# Patient Record
Sex: Female | Born: 1960
Health system: Southern US, Community
[De-identification: ages and names within clinical notes are randomized; demographics above are authoritative.]

## PROBLEM LIST (undated history)

## (undated) DIAGNOSIS — R112 Nausea with vomiting, unspecified: Secondary | ICD-10-CM

## (undated) DIAGNOSIS — G8929 Other chronic pain: Secondary | ICD-10-CM

## (undated) DIAGNOSIS — M545 Low back pain, unspecified: Secondary | ICD-10-CM

## (undated) DIAGNOSIS — E039 Hypothyroidism, unspecified: Secondary | ICD-10-CM

## (undated) DIAGNOSIS — M419 Scoliosis, unspecified: Secondary | ICD-10-CM

## (undated) DIAGNOSIS — Z9889 Other specified postprocedural states: Secondary | ICD-10-CM

## (undated) DIAGNOSIS — E7800 Pure hypercholesterolemia, unspecified: Secondary | ICD-10-CM

## (undated) DIAGNOSIS — Z9289 Personal history of other medical treatment: Secondary | ICD-10-CM

## (undated) DIAGNOSIS — G709 Myoneural disorder, unspecified: Secondary | ICD-10-CM

## (undated) DIAGNOSIS — I499 Cardiac arrhythmia, unspecified: Secondary | ICD-10-CM

## (undated) DIAGNOSIS — G43909 Migraine, unspecified, not intractable, without status migrainosus: Secondary | ICD-10-CM

## (undated) DIAGNOSIS — J45909 Unspecified asthma, uncomplicated: Secondary | ICD-10-CM

## (undated) DIAGNOSIS — N182 Chronic kidney disease, stage 2 (mild): Secondary | ICD-10-CM

## (undated) DIAGNOSIS — F419 Anxiety disorder, unspecified: Secondary | ICD-10-CM

## (undated) HISTORY — PX: BACK SURGERY: SHX140

## (undated) HISTORY — PX: HERNIA REPAIR: SHX51

## (undated) HISTORY — PX: KNEE ARTHROSCOPY: SHX127

## (undated) HISTORY — PX: JOINT REPLACEMENT: SHX530

---

## 1986-09-07 HISTORY — PX: ABDOMINAL HYSTERECTOMY: SHX81

## 1986-09-07 HISTORY — PX: BLADDER AUGMENTATION: SHX1233

## 2001-04-07 ENCOUNTER — Emergency Department (HOSPITAL_COMMUNITY): Admission: EM | Admit: 2001-04-07 | Discharge: 2001-04-07 | Payer: Self-pay | Admitting: Emergency Medicine

## 2003-06-10 ENCOUNTER — Encounter: Payer: Self-pay | Admitting: Family Medicine

## 2003-06-10 ENCOUNTER — Encounter: Admission: RE | Admit: 2003-06-10 | Discharge: 2003-06-10 | Payer: Self-pay | Admitting: Family Medicine

## 2003-12-27 ENCOUNTER — Other Ambulatory Visit: Admission: RE | Admit: 2003-12-27 | Discharge: 2003-12-27 | Payer: Self-pay | Admitting: Gynecology

## 2004-02-07 ENCOUNTER — Ambulatory Visit (HOSPITAL_COMMUNITY): Admission: RE | Admit: 2004-02-07 | Discharge: 2004-02-07 | Payer: Self-pay | Admitting: Gynecology

## 2004-03-27 ENCOUNTER — Ambulatory Visit (HOSPITAL_COMMUNITY): Admission: RE | Admit: 2004-03-27 | Discharge: 2004-03-27 | Payer: Self-pay | Admitting: Gynecology

## 2004-11-02 ENCOUNTER — Encounter: Admission: RE | Admit: 2004-11-02 | Discharge: 2004-11-02 | Payer: Self-pay | Admitting: Orthopedic Surgery

## 2005-08-05 ENCOUNTER — Encounter: Admission: RE | Admit: 2005-08-05 | Discharge: 2005-08-05 | Payer: Self-pay | Admitting: Orthopedic Surgery

## 2006-02-12 ENCOUNTER — Emergency Department (HOSPITAL_COMMUNITY): Admission: EM | Admit: 2006-02-12 | Discharge: 2006-02-12 | Payer: Self-pay | Admitting: Emergency Medicine

## 2006-09-07 HISTORY — PX: SPINAL FUSION: SHX223

## 2006-12-10 ENCOUNTER — Emergency Department (HOSPITAL_COMMUNITY): Admission: EM | Admit: 2006-12-10 | Discharge: 2006-12-10 | Payer: Self-pay | Admitting: Emergency Medicine

## 2007-02-08 ENCOUNTER — Encounter: Admission: RE | Admit: 2007-02-08 | Discharge: 2007-02-08 | Payer: Self-pay | Admitting: Orthopedic Surgery

## 2007-02-22 ENCOUNTER — Encounter: Admission: RE | Admit: 2007-02-22 | Discharge: 2007-02-22 | Payer: Self-pay | Admitting: Orthopedic Surgery

## 2007-06-20 ENCOUNTER — Emergency Department (HOSPITAL_COMMUNITY): Admission: EM | Admit: 2007-06-20 | Discharge: 2007-06-20 | Payer: Self-pay | Admitting: Emergency Medicine

## 2008-10-03 ENCOUNTER — Encounter: Admission: RE | Admit: 2008-10-03 | Discharge: 2008-10-03 | Payer: Self-pay | Admitting: Orthopedic Surgery

## 2008-12-31 ENCOUNTER — Encounter: Admission: RE | Admit: 2008-12-31 | Discharge: 2008-12-31 | Payer: Self-pay | Admitting: Sports Medicine

## 2009-06-05 ENCOUNTER — Encounter: Admission: RE | Admit: 2009-06-05 | Discharge: 2009-06-05 | Payer: Self-pay | Admitting: Orthopedic Surgery

## 2010-04-10 ENCOUNTER — Encounter: Payer: Self-pay | Admitting: Sports Medicine

## 2010-05-13 ENCOUNTER — Encounter: Payer: Self-pay | Admitting: Sports Medicine

## 2010-05-22 ENCOUNTER — Encounter: Payer: Self-pay | Admitting: Family Medicine

## 2010-05-22 ENCOUNTER — Ambulatory Visit: Payer: Self-pay | Admitting: Sports Medicine

## 2010-05-22 DIAGNOSIS — R269 Unspecified abnormalities of gait and mobility: Secondary | ICD-10-CM | POA: Insufficient documentation

## 2010-05-22 DIAGNOSIS — M2170 Unequal limb length (acquired), unspecified site: Secondary | ICD-10-CM | POA: Insufficient documentation

## 2010-05-22 DIAGNOSIS — M4120 Other idiopathic scoliosis, site unspecified: Secondary | ICD-10-CM | POA: Insufficient documentation

## 2010-05-22 DIAGNOSIS — M79609 Pain in unspecified limb: Secondary | ICD-10-CM | POA: Insufficient documentation

## 2010-07-22 ENCOUNTER — Ambulatory Visit: Payer: Self-pay | Admitting: Family Medicine

## 2010-07-22 DIAGNOSIS — M76899 Other specified enthesopathies of unspecified lower limb, excluding foot: Secondary | ICD-10-CM | POA: Insufficient documentation

## 2010-08-19 ENCOUNTER — Ambulatory Visit: Payer: Self-pay | Admitting: Family Medicine

## 2010-09-29 ENCOUNTER — Encounter: Payer: Self-pay | Admitting: Orthopedic Surgery

## 2010-10-09 NOTE — Assessment & Plan Note (Signed)
Summary: RT HIP PAIN /NEW PATIENT   Vital Signs:  Patient profile:   50 year old female Height:      62.5 inches Weight:      150 pounds BMI:     27.10 BP sitting:   139 / 86  History of Present Illness: 50yo female referred to office by Dr. Laurann Montana for evaluation of right leg pain. Pt has hx of scoliosis with multiple surgeries including fusion in 1975, revision surgery 2009, L1-iliac wing revision fusion 2010 (Dr Elvina Sidle), and revision Jan 2011 due to broken hard wear.  She is followed at the [facility] for Scoliosis and Spine Surgery for this. She suffers from chronic Rt leg pain which intensified after surgery 09/2009. Pain starts in posterior aspect of leg & stops at the knee.  She has associated numbness & tingling in this area as well, with some numbness of her Rt great toe.  Her surgeon has attributed this to sciatic nerve irritation from surgery.   She denies any back pain at this time, this resolved after most recent surgery. She is taking neurontin 600mg  q hs & mobic daily.  Tried Lyrica, but could not tolerate due to side effects. Has also completed physical therapy. She tries to walk 1.5 miles every other day, but Rt leg has been bothering her with this.  She was previously a runner, but stopped running in 2009. Denies any changes in bowel or bladder.   Currently a student,  but was working for [employer].     Allergies (verified): 1)  ! Codeine  Past History:  Past Medical History: Anemia Anxiety Asthma Chronic bronchitis Klippel-Feil Syndrome  Past Surgical History: Spinal fusion 1975/76 Bladder augmentation 1988 Knee arthroscopy 1995 (Dr. Eulah Pont) Back surgery 2009 L1-iliac wing revision fusion 2010 (Dr Elvina Sidle) Back surgery/revision Jan. 2011 (Dr Elvina Sidle)  Family History: Mom is living. Dad is living & in good health. 2 living siblings. Fam hx of arthritis, osteoporosis, HTN, back problems, cancer, diabetes, heart problems  Social  History: Married Non-smoker. Occasional alcohol use. currently a Consulting civil engineer, previously worked for [employer]  Review of [employer]:  Denies chills, fatigue, fever, and loss of appetite. CV:  Denies leg cramps with exertion, shortness of breath with exertion, swelling of feet, and swelling of hands. Resp:  Denies shortness of breath. GI:  Denies change in bowel habits. MS:  Complains of joint pain, low back pain, mid back pain, muscle aches, stiffness, and thoracic pain; denies joint redness, joint swelling, loss of strength, and muscle weakness. Derm:  Denies changes in color of skin, changes in nail beds, flushing, lesion(s), and rash. Neuro:  Complains of numbness and tingling; denies falling down, memory loss, tremors, and visual disturbances. Heme:  Denies abnormal bruising, bleeding, fevers, and skin discoloration.  Physical Exam  General:  Well-developed,well-nourished,in no acute distress; alert,appropriate and cooperative throughout examination Msk:  BACK: large midline surgical scar from mid-thoracic spine extending to lumbar spine, well-healed, no signs of infection.  L-spine ROM - flexion 80, extension 15, Rt sidebending 5, Lt sidebending 15.  Able to toe walk & heel walk.  No TTP midline or in paraspinal muscles.  HIPS: Pelvis unlevel with R-iliac crest elevated.  ROM - Rt hip - flexion 120, Ext 45, IR <5, ER 80; Lt hip - flexion 120, ext 45, IR 20.   Strength: Rt hip +4/5 abduction, adduction, extension; +5/5 hip flexion Lt hip +4/5 abduction, adduction, +5/5 flexion & extension.  GAIT: leg length difference (Lt leg  1cm shorter than right).  Standing R shoulder is elevated & pt leaning to the left.  Trendelenberg gait with shift to the left.  Rt foot externally rotated & with wide swing.  Pt noted to have extreme external rotation of her hips given other restrictions.  Noted to be hypermobile with increased recurvatum of extended elbows & able to extend thumb to touch  forearm. Pulses:  +2/4 DP & PT b/l Extremities:  no edema Neurologic:  sensation intact to light touch.   DTRs +1/4 patella, achilles bilaterally    Impression & Recommendations:  Problem # 1:  LEG PAIN, RIGHT (ICD-729.5) - Rt leg pain with significant restriction of Rt hip internal rotation & leg length difference.  Pain likely referred from her low back & irritation of sciatic nerve following surgery. - Does have hip muscle weakness - instructed on home exercises with hip flexion/extension, hip adduction/abduction, hip rotations. - Fitted with sports insoles & lift in her left shoe. - Cont. current medications as prescribed, no medication changes today. - f/u 88-month for re-evaluation, encouraged to call with questions/concerns.  Orders: Foot Insert (Z6109) Sports Insoles [phone])  Problem # 2:  SCOLIOSIS, LUMBAR SPINE (ICD-737.30) - s/p multiple spine surgeries, most recently 09/2009.  Likely contributing to her right leg pain.  Orders: Foot Insert (L3080) Sports Insoles [phone])  Problem # 3:  ABNORMALITY OF GAIT (ICD-781.2) - Noted to have leg length difference (Lt leg 1cm shorter) and trendelenberg gait.  Both felt to be largely related to scoliosis & surgeries. - Fitted with sports insoles & heel lift in left shoe.  Gait more neutral with heel lift, but still leaning to the left.  Pt felt more comfortable with sports insoles & lift. - Work on hip exercises to improve strength & flexibility.  Improved gait mechanics with insoles & lift should help with pain.  Orders: Foot Insert (L3080) Sports Insoles (L3510)  Problem # 4:  UNEQUAL LEG LENGTH (ICD-736.81)  - Lt leg 1cm shorter than right - Fitted with heel lift in left shoe  Orders: Foot Insert (B1478) Sports Insoles (L3510)  Complete Medication List: 1)  Celexa 20 Mg Tabs (Citalopram hydrobromide) .... 1.5 tabs by mouth daily 2)  Skelaxin 800 Mg Tabs (Metaxalone) .Marland Kitchen.. 1 by mouth daily prn 3)  Albuterol Prn  4)   Mobic 15 Mg Tabs (Meloxicam) .Marland Kitchen.. 1 by mouth daily 5)  Wellbutrin Xl 300 Mg Xr24h-tab (Bupropion hcl) .Marland Kitchen.. 1 by mouth daily 6)  Neurontin 600 Mg Tabs (Gabapentin) .Marland Kitchen.. 1 by mouth q hs 7)  Valium 5 Mg Tabs (Diazepam) .... Prn 8)  Vicodin 5-500 Mg Tabs (Hydrocodone-acetaminophen) .... As needed migraines

## 2010-10-09 NOTE — Consult Note (Signed)
Summary: [facility]   Imported By: Marily Memos 05/13/2010 15:44:32  _____________________________________________________________________  External Attachment:    Type:   Image     Comment:   External Document

## 2010-10-09 NOTE — Assessment & Plan Note (Signed)
Summary: F/U HIP,MC   Vital Signs:  Patient profile:   50 year old female BP sitting:   112 / 79  Vitals Entered By: Lillia Pauls CMA (July 22, 2010 10:59 AM)  History of Present Illness: 50 year old here with f/u R hip pain. Tolerated leg lift and feels good. Has been doing some home EP without great success. Still with significant troch bursitis and hip and buttocks pain. No groin pain.   Prior OV: 50yo female referred to office by Dr. Laurann Montana for evaluation of right leg pain. Pt has hx of scoliosis with multiple surgeries including fusion in 1975, revision surgery 2009, L1-iliac wing revision fusion 2010 (Dr Elvina Sidle), and revision Jan 2011 due to broken hard wear.  She is followed at the [facility] for Scoliosis and Spine Surgery for this. She suffers from chronic Rt leg pain which intensified after surgery 09/2009. Pain starts in posterior aspect of leg & stops at the knee.  She has associated numbness & tingling in this area as well, with some numbness of her Rt great toe.  Her surgeon has attributed this to sciatic nerve irritation from surgery.   She denies any back pain at this time, this resolved after most recent surgery. She is taking neurontin 600mg  q hs & mobic daily.  Tried Lyrica, but could not tolerate due to side effects. Has also completed physical therapy. She tries to walk 1.5 miles every other day, but Rt leg has been bothering her with this.  She was previously a runner, but stopped running in 2009. Denies any changes in bowel or bladder.   Currently a student,  but was working for [employer].     Allergies: 1)  ! Codeine  Past History:  Past medical, surgical, family and social histories (including risk factors) reviewed, and no changes noted (except as noted below).  Past Medical History: Reviewed history from 05/22/2010 and no changes required. Anemia Anxiety Asthma Chronic bronchitis Klippel-Feil Syndrome  Past Surgical  History: Reviewed history from 05/22/2010 and no changes required. Spinal fusion 1975/76 Bladder augmentation 1988 Knee arthroscopy 1995 (Dr. Eulah Pont) Back surgery 2009 L1-iliac wing revision fusion 2010 (Dr Elvina Sidle) Back surgery/revision Jan. 2011 (Dr Elvina Sidle)  Family History: Reviewed history from 05/22/2010 and no changes required. Mom is living. Dad is living & in good health. 2 living siblings. Fam hx of arthritis, osteoporosis, HTN, back problems, cancer, diabetes, heart problems  Social History: Reviewed history from 05/22/2010 and no changes required. Married Non-smoker. Occasional alcohol use. currently a Consulting civil engineer, previously worked for [employer]       aas above, eating and drinking, no nausea, vomitting. no sob.  Physical Exam  General:  GEN: Well-developed,well-nourished,in no acute distress; alert,appropriate and cooperative throughout examination HEENT: Normocephalic and atraumatic without obvious abnormalities. No apparent alopecia or balding. Ears, externally no deformities PULM: Breathing comfortably in no respiratory distress EXT: No clubbing, cyanosis, or edema PSYCH: Normally interactive. Cooperative during the interview. Pleasant. Friendly and conversant. Not anxious or depressed appearing. Normal, full affect.  Msk:  BACK: large midline surgical scar from mid-thoracic spine extending to lumbar spine, well-healed, no signs of infection.  L-spine ROM - flexion 80, extension 15, Rt sidebending 5, Lt sidebending 15.  Able to toe walk & heel walk.  No TTP midline or in paraspinal muscles.  HIPS: Pelvis unlevel with R-iliac crest elevated.  ROM - Rt hip - flexion 120, Ext 45, IR <5, ER 80; Lt hip - flexion 120,  ext 45, IR 20.   Strength: Rt hip 4/5 abduction, adduction, extension; +5/5 hip flexion Lt hip +4/5 abduction, adduction, +5/5 flexion & extension.  GAIT: leg length difference (Lt leg 1cm shorter than right).  Standing R shoulder is  elevated & pt leaning to the left.  Trendelenberg gait with shift to the left.  Rt foot externally rotated & with wide swing.  Pt noted to have extreme external rotation of her hips given other restrictions.  Noted to be hypermobile with increased recurvatum of extended elbows & able to extend thumb to touch forearm.   Impression & Recommendations:  Problem # 1:  LEG PAIN, RIGHT (ICD-729.5) GTB continued poor str  formal PT  Problem # 2:  BURSITIS, HIP (ICD-726.5)  Trochanteric Bursitis Injection Verbal consent obtained. Risks, benefits, and alternatives reviewed. R  greater trochanter sterilely prepped with Betadine. Ethyl Chloride used for anesthesia. 8 cc of Marcaine 0.5% injected with 2 cc of 40 mg Kenalog into trochanteric bursa at area of maximal tenderness at greater trochanter. Needle taken to bone to troch bursa, flows easily. Bursa massaged. No bleeding and no complications. Decreased pain after injection. Needle: 22 gauge spinal needle   Orders: Joint Aspirate / Injection, Large (20610) Kenalog 10mg  (4units) (J3301)  Complete Medication List: 1)  Celexa 20 Mg Tabs (Citalopram hydrobromide) .... 1.5 tabs by mouth daily 2)  Skelaxin 800 Mg Tabs (Metaxalone) .Marland Kitchen.. 1 by mouth daily prn 3)  Albuterol Prn  4)  Mobic 15 Mg Tabs (Meloxicam) .... 1/2 tab once daily 5)  Wellbutrin Xl 300 Mg Xr24h-tab (Bupropion hcl) .Marland Kitchen.. 1 by mouth daily 6)  Neurontin 600 Mg Tabs (Gabapentin) .Marland Kitchen.. 1 by mouth q hs & 1/2 tab qam 7)  Tramadol Hcl 50 Mg Tabs (Tramadol hcl) .Marland Kitchen.. 1 tab two times a day  Patient Instructions: 1)  Increase Gabapentin to 300 mg by mouth three times a day, after 2 weeks, increase to 300 mg in AM, 300 mg in early afternoon after lunch, and 600 mg at night. 2)  Hip and leg rehab 3)  PT referral 4)  recheck 6 weeks   Orders Added: 1)  Est. Patient Level III [11914] 2)  Joint Aspirate / Injection, Large [20610] 3)  Kenalog 10mg  (4units) [J3301]

## 2010-10-09 NOTE — Letter (Signed)
Summary: [facility] referral   [facility] referral   Imported By: Marily Memos 07/22/2010 12:17:06  _____________________________________________________________________  External Attachment:    Type:   Image     Comment:   External Document

## 2010-10-09 NOTE — Letter (Signed)
Summary: *Consult Note  [facility]  [address]   [place], [place] [address]   Phone: [phone]  Fax: [phone]    Re:    Nicole Morris DOB:    1961-07-23   Dear Dr. Laurann Montana:    Thank you for requesting that we see the above patient for consultation.  A copy of the detailed office note will be sent under separate cover, for your review.  Evaluation today is consistent with:  1)  UNEQUAL LEG LENGTH (ICD-736.81) 2)  LEG PAIN, RIGHT (ICD-729.5) 3)  ABNORMALITY OF GAIT (ICD-781.2) 4)  SCOLIOSIS, LUMBAR SPINE (ICD-737.30)   Our recommendation is for:  1) Patient was given set of sports insoles & a heel lift in left shoe to help with unequal leg lengths and gait abnormality. 2) Instructed on hip exercise program to improve hip strength. 3) Continue medications as prescribed with no new additions. 4) Follow-up with [place] in 28-month for re-evaluation.   New Orders include:  1)  Consultation Level II [99242] 2)  Foot Insert [L3080] 3)  Sports Insoles [L3510]   New Medications started today include: None   After today's visit, the patients current medications include: 1)  CELEXA 20 MG TABS (CITALOPRAM HYDROBROMIDE) 1.5 tabs by mouth daily 2)  SKELAXIN 800 MG TABS (METAXALONE) 1 by mouth daily prn 3)  * ALBUTEROL PRN  4)  MOBIC 15 MG TABS (MELOXICAM) 1 by mouth daily 5)  WELLBUTRIN XL 300 MG XR24H-TAB (BUPROPION HCL) 1 by mouth daily 6)  NEURONTIN 600 MG TABS (GABAPENTIN) 1 by mouth q hs 7)  VALIUM 5 MG TABS (DIAZEPAM) prn 8)  VICODIN 5-500 MG TABS (HYDROCODONE-ACETAMINOPHEN) as needed migraines   Thank you for this consultation.  If you have any further questions regarding the care of this patient, please do not hesitate to contact me @ [phone]  Thank you for this opportunity to look after your patient.  Sincerely,   Darene Lamer DO [facility]

## 2010-10-09 NOTE — Consult Note (Signed)
Summary: [facility]  [facility]   Imported By: Marily Memos 06/11/2010 10:19:30  _____________________________________________________________________  External Attachment:    Type:   Image     Comment:   External Document

## 2010-12-15 ENCOUNTER — Other Ambulatory Visit: Payer: Self-pay | Admitting: Family Medicine

## 2010-12-15 ENCOUNTER — Ambulatory Visit
Admission: RE | Admit: 2010-12-15 | Discharge: 2010-12-15 | Disposition: A | Discharge status: Home / Self Care | Payer: BC Managed Care – HMO | Source: Ambulatory Visit | Attending: Family Medicine | Admitting: Family Medicine

## 2010-12-15 DIAGNOSIS — R4020 Unspecified coma: Secondary | ICD-10-CM

## 2010-12-15 DIAGNOSIS — W19XXXA Unspecified fall, initial encounter: Secondary | ICD-10-CM

## 2010-12-15 DIAGNOSIS — R51 Headache: Secondary | ICD-10-CM

## 2010-12-30 ENCOUNTER — Encounter: Payer: Self-pay | Admitting: *Deleted

## 2011-01-09 ENCOUNTER — Ambulatory Visit (INDEPENDENT_AMBULATORY_CARE_PROVIDER_SITE_OTHER): Payer: BC Managed Care – HMO | Admitting: Family Medicine

## 2011-01-09 DIAGNOSIS — M7710 Lateral epicondylitis, unspecified elbow: Secondary | ICD-10-CM

## 2011-01-09 DIAGNOSIS — M76899 Other specified enthesopathies of unspecified lower limb, excluding foot: Secondary | ICD-10-CM

## 2011-01-09 DIAGNOSIS — M79609 Pain in unspecified limb: Secondary | ICD-10-CM

## 2011-01-09 MED ORDER — KETOPROFEN POWD: Status: DC

## 2011-01-09 NOTE — Assessment & Plan Note (Signed)
Right hip greater trochanter bursitis. Underwent corticosteroid injection in the office today as stated above

## 2011-01-09 NOTE — Assessment & Plan Note (Signed)
-   Secondary to greater trochanteric bursitis, but also likely component of referred pain from her low back from chronic sciatic irritation from previous back surgeries - Underwent repeat greater trochanteric bursa injection and office today as she had significant improvement for proximally 4 months with last injection PROCEDURE NOTE Consent obtained and verified. Area cleansed with alcohol. Topical analgesic spray: Ethyl chloride. Joint: Right greater trochanteric bursa Approached in typical fashion with: Lateral approach Completed without difficulty Meds: 4 cc 1% lidocaine +1 cc Kenalog 40 mg/cc Needle: 25-gauge spinal needle Aftercare instructions and Red flags advised. Tolerated procedure well without any complications. - Emphasized need to continue hip home exercises - Will continue to titrate dose of gabapentin with goal of 600 mg 3 times a day as long as she is not experiencing adverse effects - Followup 6 weeks for reevaluation

## 2011-01-09 NOTE — Progress Notes (Signed)
  Subjective:    Patient ID: Nicole Morris, female    DOB: November 04, 1960, 50 y.o.   MRN: [id number]  HPI 50 year old female to office for followup of right hip pain and leg pain, and also for evaluation of left elbow pain. Patient underwent a trochanteric bursal injection last visit 07/2010 with good relief of symptoms for approximately 4 months. She's been having increasing symptoms over the past 2 months primarily along the lateral aspect of her right hip and into her buttock. She continues to have associated numbness and tingling along anterior lateral thigh radiates down into her right great toe. She has been taking Neurontin 300 mg during the day and 600 mg at bedtime and Mobic with minimal improvement. Has been doing home exercises for her hips, but don't feel like they are helping at this time. She denies any change in her bowel or bladder, denies any saddle anesthesia, denies any fevers or chills. Of note has hx of scoliosis with multiple surgeries including fusion in 1975, revision surgery 2009, L1-iliac wing revision fusion 2010 (Dr Elvina Sidle), and revision Jan 2011 due to broken hard wear. She is followed at the [facility] for Scoliosis and Spine Surgery for this.  Patient also complains of increasing left lateral elbow pain over the past one to 2 months. Denies any injury or trauma. Has been using bio freeze and Lidoderm no improvement. Has not tried any braces. Denies any history of elbow issues. She is right-hand dominant.  Review of Systems Per history of present illness, otherwise negative    Objective:   Physical Exam GEN: Well-developed,well-nourished,in no acute distress; alert,appropriate and cooperative throughout examination  Msk:  - ELBOW: Left elbow with full range of motion without swelling or effusion. As the attending palpation over the lateral epicondyle and extensor musculature. No medial epicondyle pain. Pain with resisted wrist extension and resisted third finger extension. Positive  book test. Right elbow with full range of motion without pain, swelling, tenderness, weakness. - BACK: large midline surgical scar from mid-thoracic spine extending to lumbar spine, well-healed, no signs of infection. L-spine ROM - flexion 80, extension 15, Rt sidebending 5, Lt sidebending 15. Able to toe walk & heel walk. No TTP midline or in paraspinal muscles.  - HIPS: Pelvis unlevel with R-iliac crest elevated. ROM - Rt hip - flexion 120, Ext 45, IR <5, ER 60; Lt hip - flexion 120, ext 45, IR 20, ER 60.  Strength: Rt hip 4/5 abduction, adduction, extension; +5/5 hip flexion  Lt hip +4/5 abduction, adduction, +5/5 flexion & extension.  - GAIT: leg length difference (Lt leg 1cm shorter than right). Standing R shoulder is elevated & pt leaning to the left. Trendelenberg gait with shift to the left. Rt foot externally rotated & with wide swing.        Assessment & Plan:

## 2011-01-09 NOTE — Patient Instructions (Signed)
1) Cont your hip exercises 2) Titrate you gapapentin dose up - start 300mg  AM, 300mg  PM, 600mg  bedtime x 1 week, then 600mg  AM, 300mg  PM, 600mg  q bedtime x 1-week, then 600mg  AM, 600mg  PM, 600mg  at bed time.  Cont to increase as long as not having side effects 3) Start doing elbow exercises as demonstrated - twice a day 4) Apply ketoprofen gel to elbow 3-4 times a day - need to get this medication filled at [facility] or [facility] 5) Follow-up in 6 weeks for your back & elbow

## 2011-01-09 NOTE — Assessment & Plan Note (Addendum)
Left lateral epicondylitis Physical counterforce brace and office today. Demonstrated home exercises which she should do twice daily. Rx for topical ketoprofen gel Reevaluate in 6 weeks.

## 2011-01-23 DIAGNOSIS — F0781 Postconcussional syndrome: Secondary | ICD-10-CM

## 2011-01-23 DIAGNOSIS — S060X9A Concussion with loss of consciousness of unspecified duration, initial encounter: Secondary | ICD-10-CM

## 2011-03-13 DIAGNOSIS — F0781 Postconcussional syndrome: Secondary | ICD-10-CM

## 2011-03-13 DIAGNOSIS — S060X9A Concussion with loss of consciousness of unspecified duration, initial encounter: Secondary | ICD-10-CM

## 2011-03-23 ENCOUNTER — Encounter: Payer: Self-pay | Admitting: Sports Medicine

## 2011-03-23 ENCOUNTER — Ambulatory Visit
Admission: RE | Admit: 2011-03-23 | Discharge: 2011-03-23 | Disposition: A | Discharge status: Home / Self Care | Payer: BC Managed Care – HMO | Source: Ambulatory Visit | Attending: Sports Medicine | Admitting: Sports Medicine

## 2011-03-23 ENCOUNTER — Ambulatory Visit (INDEPENDENT_AMBULATORY_CARE_PROVIDER_SITE_OTHER): Payer: BC Managed Care – HMO | Admitting: Sports Medicine

## 2011-03-23 VITALS — BP 110/76 | HR 75

## 2011-03-23 DIAGNOSIS — M79609 Pain in unspecified limb: Secondary | ICD-10-CM

## 2011-03-23 DIAGNOSIS — M76899 Other specified enthesopathies of unspecified lower limb, excluding foot: Secondary | ICD-10-CM

## 2011-03-23 DIAGNOSIS — M25551 Pain in right hip: Secondary | ICD-10-CM

## 2011-03-23 DIAGNOSIS — M25559 Pain in unspecified hip: Secondary | ICD-10-CM

## 2011-03-23 NOTE — Progress Notes (Signed)
  Subjective:    Patient ID: Nicole Morris, female    DOB: March 23, 1961, 50 y.o.   MRN: [id number]  HPI  Pt presents to clinic for f/u of rt hip pain which is not improved Taking tramadol x1 per day and 1 vicodin before bed.  Takes gabapentin 1.5 per day.  Pain wakes her at night. Also having lateral knee and ankle pain x 1 month.   Pain with walking or sitting.  Had inj at last visit which helped for about 1 month.  Had horseback riding accident on Easter- had severe concussion.  Has not completely recovered from this.  No recent hip x-rays and her symptoms have certainly worsened since the last time she had her hips x-rayed    Review of Systems     Objective:   Physical Exam No acute distress    Lt hip 45 deg ER, 25 deg IR seated Rt hip less than 5 deg IR, and 60 deg ER seated Leg lengths lying- very slight different, pelvis slightly off center.  No leg length difference with sitting Full flexion lt hip Full flexion rt hip, but deviates to outside Log roll on rt- good ER, no IR SI joints move freely Still has tenderness over Rt iliac crest Surgical scar from low lumbar to T3 along spine Significant scoliosis     Assessment & Plan:

## 2011-03-23 NOTE — Assessment & Plan Note (Signed)
I believe this is referred from the hip joint the tendons to get down the outside of the leg and sometimes to the inside of the thigh

## 2011-03-23 NOTE — Assessment & Plan Note (Signed)
While she continues to get some bursitis type symptoms and has responded well to injection on 2 occasions I am concerned that she probably has DJD of the right hip. This is because she has such limited internal rotation and with her scoliosis has had an abnormal gait for a long time.  We will check right hip x-rays today  We will decide the treatment course after reviewing these x-rays but if they do not reveal arthritis would at least try a good course of physical therapy

## 2011-03-24 NOTE — Progress Notes (Signed)
Addended by: Jacki Cones C on: 03/24/2011 11:08 AM   Modules accepted: Orders

## 2011-03-24 NOTE — Progress Notes (Signed)
PT referral info faxed to [facility] [address] per pt's request.

## 2011-04-02 ENCOUNTER — Ambulatory Visit: Payer: BC Managed Care – HMO | Attending: Sports Medicine | Admitting: Rehabilitative and Restorative Service Providers"

## 2011-04-02 DIAGNOSIS — M2560 Stiffness of unspecified joint, not elsewhere classified: Secondary | ICD-10-CM | POA: Insufficient documentation

## 2011-04-02 DIAGNOSIS — R293 Abnormal posture: Secondary | ICD-10-CM | POA: Insufficient documentation

## 2011-04-02 DIAGNOSIS — IMO0001 Reserved for inherently not codable concepts without codable children: Secondary | ICD-10-CM | POA: Insufficient documentation

## 2011-04-02 DIAGNOSIS — M2550 Pain in unspecified joint: Secondary | ICD-10-CM | POA: Insufficient documentation

## 2011-04-20 ENCOUNTER — Ambulatory Visit: Payer: BC Managed Care – HMO | Attending: Sports Medicine | Admitting: Rehabilitative and Restorative Service Providers"

## 2011-04-20 DIAGNOSIS — R293 Abnormal posture: Secondary | ICD-10-CM | POA: Insufficient documentation

## 2011-04-20 DIAGNOSIS — IMO0001 Reserved for inherently not codable concepts without codable children: Secondary | ICD-10-CM | POA: Insufficient documentation

## 2011-04-20 DIAGNOSIS — M2550 Pain in unspecified joint: Secondary | ICD-10-CM | POA: Insufficient documentation

## 2011-04-22 ENCOUNTER — Ambulatory Visit: Payer: BC Managed Care – HMO | Admitting: Rehabilitative and Restorative Service Providers"

## 2011-04-27 ENCOUNTER — Ambulatory Visit: Payer: BC Managed Care – HMO | Admitting: Rehabilitative and Restorative Service Providers"

## 2011-04-29 ENCOUNTER — Encounter: Payer: BC Managed Care – HMO | Admitting: Rehabilitative and Restorative Service Providers"

## 2011-05-05 ENCOUNTER — Ambulatory Visit: Payer: BC Managed Care – HMO | Admitting: Physical Therapy

## 2011-05-07 ENCOUNTER — Ambulatory Visit: Payer: BC Managed Care – HMO | Admitting: Physical Therapy

## 2011-05-12 ENCOUNTER — Ambulatory Visit: Payer: BC Managed Care – HMO | Attending: Sports Medicine | Admitting: Physical Therapy

## 2011-05-12 DIAGNOSIS — R293 Abnormal posture: Secondary | ICD-10-CM | POA: Insufficient documentation

## 2011-05-12 DIAGNOSIS — IMO0001 Reserved for inherently not codable concepts without codable children: Secondary | ICD-10-CM | POA: Insufficient documentation

## 2011-05-12 DIAGNOSIS — M2550 Pain in unspecified joint: Secondary | ICD-10-CM | POA: Insufficient documentation

## 2011-05-14 ENCOUNTER — Encounter: Payer: BC Managed Care – HMO | Admitting: Rehabilitative and Restorative Service Providers"

## 2011-06-02 DIAGNOSIS — F0781 Postconcussional syndrome: Secondary | ICD-10-CM

## 2011-06-02 DIAGNOSIS — X58XXXA Exposure to other specified factors, initial encounter: Secondary | ICD-10-CM

## 2011-06-02 DIAGNOSIS — S060X9A Concussion with loss of consciousness of unspecified duration, initial encounter: Secondary | ICD-10-CM

## 2011-09-18 ENCOUNTER — Other Ambulatory Visit: Payer: Self-pay | Admitting: Neurological Surgery

## 2011-09-18 DIAGNOSIS — M5410 Radiculopathy, site unspecified: Secondary | ICD-10-CM

## 2011-09-18 DIAGNOSIS — M545 Low back pain, unspecified: Secondary | ICD-10-CM

## 2011-09-28 ENCOUNTER — Ambulatory Visit
Admission: RE | Admit: 2011-09-28 | Discharge: 2011-09-28 | Disposition: A | Discharge status: Home / Self Care | Payer: 59 | Source: Ambulatory Visit | Attending: Neurological Surgery | Admitting: Neurological Surgery

## 2011-09-28 DIAGNOSIS — M5410 Radiculopathy, site unspecified: Secondary | ICD-10-CM

## 2011-09-28 DIAGNOSIS — M545 Low back pain, unspecified: Secondary | ICD-10-CM

## 2011-09-28 MED ORDER — DIPHENHYDRAMINE HCL 50 MG PO CAPS
50.0000 mg | ORAL_CAPSULE | Freq: Once | ORAL | Status: AC
  Administered 2011-09-28: 50 mg via ORAL

## 2011-09-28 MED ORDER — ONDANSETRON HCL 4 MG/2ML IJ SOLN
4.0000 mg | Freq: Once | INTRAMUSCULAR | Status: AC
  Administered 2011-09-28: 4 mg via INTRAMUSCULAR

## 2011-09-28 MED ORDER — MEPERIDINE HCL 100 MG/ML IJ SOLN
75.0000 mg | Freq: Once | INTRAMUSCULAR | Status: AC
  Administered 2011-09-28: 75 mg via INTRAMUSCULAR

## 2011-09-28 MED ORDER — DIAZEPAM 5 MG PO TABS
10.0000 mg | ORAL_TABLET | Freq: Once | ORAL | Status: AC
  Administered 2011-09-28: 10 mg via ORAL

## 2011-09-28 MED ORDER — IOHEXOL 300 MG/ML  SOLN
10.0000 mL | Freq: Once | INTRAMUSCULAR | Status: AC | PRN
  Administered 2011-09-28: 10 mL via INTRATHECAL

## 2011-09-28 NOTE — Progress Notes (Signed)
Pt has been off vyvanse and zoloft for the last 2 days. Explained discharge instructions, consent signed and valium given. dd

## 2011-09-29 ENCOUNTER — Other Ambulatory Visit: Payer: Self-pay

## 2011-12-17 ENCOUNTER — Ambulatory Visit (INDEPENDENT_AMBULATORY_CARE_PROVIDER_SITE_OTHER): Payer: 59 | Admitting: Sports Medicine

## 2011-12-17 VITALS — BP 130/89

## 2011-12-17 DIAGNOSIS — M7700 Medial epicondylitis, unspecified elbow: Secondary | ICD-10-CM | POA: Insufficient documentation

## 2011-12-17 MED ORDER — MELOXICAM 15 MG PO TABS: 15.0000 mg | ORAL_TABLET | Freq: Every day | ORAL | Status: DC

## 2011-12-17 NOTE — Patient Instructions (Signed)
Use elbow sleeve Do elbow exercises Ice massage 10 minutes twice a day Wrist exercises.

## 2011-12-17 NOTE — Progress Notes (Signed)
  Subjective:    Patient ID: Nicole Morris, female    DOB: 1961/02/24, 51 y.o.   MRN: [id number]  HPI  This patient is complaining of medial elbow pain for the last month. She denies any injury to her elbow. The pain is to the taut, she states whenever she puts her elbow down on a table that this pain. No numbness or tingling. No swelling, no, hematoma. The pain is sharp, 310 intensity, on an off, worsened by direct pressure.  Patient Active Problem List  Diagnoses  . BURSITIS, HIP  . LEG PAIN, RIGHT  . UNEQUAL LEG LENGTH  . SCOLIOSIS, LUMBAR SPINE  . ABNORMALITY OF GAIT  . Lateral epicondylitis   Current Outpatient Prescriptions on File Prior to Visit  Medication Sig Dispense Refill  . ALBUTEROL IN as needed.        Marland Kitchen buPROPion (WELLBUTRIN XL) 300 MG 24 hr tablet Take 300 mg by mouth daily.        Marland Kitchen gabapentin (NEURONTIN) 600 MG tablet 1 by mouth q hs & 1/2 tab qam       . HYDROcodone-acetaminophen (VICODIN) 5-500 MG per tablet Take 5-500 mg by mouth as needed.      . Ketoprofen POWD Ketoprofen 20% gel compounded Apply to left elbow up to 4-times a day  60 g  2  . LORazepam (ATIVAN) 0.5 MG tablet Take 0.5 mg by mouth as needed.      . meloxicam (MOBIC) 15 MG tablet 1/2 tab once daily       . metaxalone (SKELAXIN) 800 MG tablet Take 800 mg by mouth. Daily prn       . traMADol (ULTRAM) 50 MG tablet 1 tab two times a day        Allergies  Allergen Reactions  . Codeine     REACTION: NAUSEA       Review of Systems  Constitutional: Negative for fever, chills, diaphoresis and fatigue.  Musculoskeletal: Negative for back pain, joint swelling, arthralgias and gait problem.  Neurological: Negative for weakness and numbness.       Objective:   Physical Exam  Constitutional: She is oriented to person, place, and time. She appears well-developed and well-nourished.       BP 130/89  Pulmonary/Chest: Effort normal.  Musculoskeletal:       R elbow with intact skin Full range of  motion of the elbow joint TTP in the medial epicondyle. Mild pain with resisted wrist flexion. Neurovascularly intact. Negative Tinel on the ulnar nerve in the cubital tunnel   Neurological: She is alert and oriented to person, place, and time.  Skin: Skin is warm. No rash noted. No erythema.  Psychiatric: She has a normal mood and affect. Her behavior is normal. Thought content normal.     MSK U/S : Swelling of the flexor tendon in the attachment in the medial epicondyle.      Assessment & Plan:   1. Epicondylitis elbow, medial    Elbow sleeve Ice massage twice a day for 20 min Elbow exercises F/U in 4 weeks

## 2012-02-23 ENCOUNTER — Other Ambulatory Visit: Payer: Self-pay | Admitting: Family Medicine

## 2012-02-25 ENCOUNTER — Other Ambulatory Visit: Payer: Self-pay | Admitting: *Deleted

## 2012-02-25 DIAGNOSIS — M7700 Medial epicondylitis, unspecified elbow: Secondary | ICD-10-CM

## 2012-02-25 MED ORDER — MELOXICAM 15 MG PO TABS: 15.0000 mg | ORAL_TABLET | Freq: Every day | ORAL | Status: AC | PRN

## 2014-04-08 ENCOUNTER — Encounter (HOSPITAL_COMMUNITY): Payer: Self-pay | Admitting: Emergency Medicine

## 2014-04-08 ENCOUNTER — Emergency Department (HOSPITAL_COMMUNITY)
Admission: EM | Admit: 2014-04-08 | Discharge: 2014-04-08 | Disposition: A | Discharge status: Home / Self Care | Payer: BC Managed Care – PPO | Attending: Emergency Medicine | Admitting: Emergency Medicine

## 2014-04-08 DIAGNOSIS — Z79899 Other long term (current) drug therapy: Secondary | ICD-10-CM | POA: Insufficient documentation

## 2014-04-08 DIAGNOSIS — Y9289 Other specified places as the place of occurrence of the external cause: Secondary | ICD-10-CM | POA: Insufficient documentation

## 2014-04-08 DIAGNOSIS — J45901 Unspecified asthma with (acute) exacerbation: Secondary | ICD-10-CM | POA: Insufficient documentation

## 2014-04-08 DIAGNOSIS — T63461A Toxic effect of venom of wasps, accidental (unintentional), initial encounter: Secondary | ICD-10-CM | POA: Insufficient documentation

## 2014-04-08 DIAGNOSIS — T6391XA Toxic effect of contact with unspecified venomous animal, accidental (unintentional), initial encounter: Secondary | ICD-10-CM | POA: Insufficient documentation

## 2014-04-08 DIAGNOSIS — IMO0002 Reserved for concepts with insufficient information to code with codable children: Secondary | ICD-10-CM | POA: Insufficient documentation

## 2014-04-08 DIAGNOSIS — T7840XA Allergy, unspecified, initial encounter: Secondary | ICD-10-CM

## 2014-04-08 DIAGNOSIS — R5381 Other malaise: Secondary | ICD-10-CM | POA: Insufficient documentation

## 2014-04-08 DIAGNOSIS — Y9389 Activity, other specified: Secondary | ICD-10-CM | POA: Insufficient documentation

## 2014-04-08 DIAGNOSIS — N182 Chronic kidney disease, stage 2 (mild): Secondary | ICD-10-CM | POA: Insufficient documentation

## 2014-04-08 DIAGNOSIS — R5383 Other fatigue: Secondary | ICD-10-CM | POA: Insufficient documentation

## 2014-04-08 HISTORY — DX: Chronic kidney disease, stage 2 (mild): N18.2

## 2014-04-08 HISTORY — DX: Unspecified asthma, uncomplicated: J45.909

## 2014-04-08 MED ORDER — EPINEPHRINE 0.3 MG/0.3ML IJ SOAJ: 0.3000 mg | INTRAMUSCULAR | Status: DC | PRN

## 2014-04-08 MED ORDER — PREDNISONE 10 MG PO TABS: 20.0000 mg | ORAL_TABLET | Freq: Every day | ORAL | Status: DC

## 2014-04-08 NOTE — ED Provider Notes (Addendum)
CSN: [id number]     Arrival date & time 04/08/14  1244 History   First MD Initiated Contact with Patient 04/08/14 1255     Chief Complaint  Patient presents with  . Allergic Reaction  . Shortness of Breath     (Consider location/radiation/quality/duration/timing/severity/associated sxs/prior Treatment) HPI Comments: Patient presents with an allergic reaction. She states that earlier this morning about 10:30 she got stung by multiple bees to her arms bilaterally. She does have a history asthma and she says after the skiing she started getting wheezy and short of breath. She started feeling swelling of her lips in the back of her throat. She went [facility] were negative her Depo-Medrol, epinephrine injections x2, Zantac, and Benadryl. The center over here for further evaluation. Currently she's feeling much better. She feels like her lower lip is still swollen but she denies any throat swelling. She has a little bit of wheezing but she says her breathing is much better. She was also given albuterol treatments prior to arrival. She denies any rash or itching other than the actual sites of the stings. She denies any other recent illnesses. She's had no history of allergic reactions to bee stings in the past.  Patient is a 53 y.o. female presenting with allergic reaction and shortness of breath.  Allergic Reaction Presenting symptoms: wheezing   Presenting symptoms: no rash   Shortness of Breath Associated symptoms: wheezing   Associated symptoms: no abdominal pain, no chest pain, no cough, no diaphoresis, no fever, no headaches, no rash and no vomiting     Past Medical History  Diagnosis Date  . Asthma   . Kidney disease, chronic, stage II (GFR 60-89 ml/min)    Past Surgical History  Procedure Laterality Date  . Spinal fusion    . Bladder augmentation    . Knee surgery    . Hernia repair     No family history on file. History  Substance Use  Topics  . Smoking status: Never Smoker   . Smokeless tobacco: Never Used  . Alcohol Use: No   OB History   Grav Para Term Preterm Abortions TAB SAB Ect Mult Living                 Review of Systems  Constitutional: Positive for fatigue. Negative for fever, chills and diaphoresis.  HENT: Positive for facial swelling. Negative for congestion, rhinorrhea and sneezing.   Eyes: Negative.   Respiratory: Positive for shortness of breath and wheezing. Negative for cough and chest tightness.   Cardiovascular: Negative for chest pain and leg swelling.  Gastrointestinal: Negative for nausea, vomiting, abdominal pain, diarrhea and blood in stool.  Genitourinary: Negative for frequency, hematuria, flank pain and difficulty urinating.  Musculoskeletal: Negative for arthralgias and back pain.  Skin: Negative for rash.  Neurological: Negative for dizziness, speech difficulty, weakness, numbness and headaches.      Allergies  Codeine  Home Medications   Prior to Admission medications   Medication Sig Start Date End Date Taking? Authorizing Provider  albuterol (PROVENTIL HFA;VENTOLIN HFA) 108 (90 BASE) MCG/ACT inhaler Inhale 2 puffs into the lungs every 6 (six) hours as needed for wheezing or shortness of breath.   Yes Historical Provider, MD  gabapentin (NEURONTIN) 300 MG capsule Take 300 mg by mouth at bedtime.   Yes Historical Provider, MD  Sertraline HCl (ZOLOFT PO) Take 1 tablet by mouth every evening.   Yes Historical Provider, MD  SUMAtriptan Succinate (IMITREX  PO) Take 1 tablet by mouth daily as needed (migraines).   Yes Historical Provider, MD  Tapentadol HCl (NUCYNTA ER PO) Take 1 tablet by mouth daily as needed (pain).   Yes Historical Provider, MD  EPINEPHrine (EPIPEN) 0.3 mg/0.3 mL IJ SOAJ injection Inject 0.3 mLs (0.3 mg total) into the muscle as needed. 04/08/14   Rolan Bucco, MD  predniSONE (DELTASONE) 10 MG tablet Take 2 tablets (20 mg total) by mouth daily. 04/08/14   Rolan Bucco, MD   BP 101/71  Pulse 82  Temp(Src) 98.5 F (36.9 C) (Oral)  Resp 18  Ht 5\' 2"  (1.575 m)  Wt 146 lb 5 oz (66.367 kg)  BMI 26.75 kg/m2  SpO2 98% Physical Exam  Constitutional: She is oriented to person, place, and time. She appears well-developed and well-nourished.  HENT:  Head: Normocephalic and atraumatic.  No angioedema  Eyes: Pupils are equal, round, and reactive to light.  Neck: Normal range of motion. Neck supple.  Cardiovascular: Normal rate, regular rhythm and normal heart sounds.   Pulmonary/Chest: Effort normal and breath sounds normal. No respiratory distress. She has no wheezes. She has no rales. She exhibits no tenderness.  Abdominal: Soft. Bowel sounds are normal. There is no tenderness. There is no rebound and no guarding.  Musculoskeletal: Normal range of motion. She exhibits no edema.  Lymphadenopathy:    She has no cervical adenopathy.  Neurological: She is alert and oriented to person, place, and time.  Skin: Skin is warm and dry. No rash noted.  Psychiatric: She has a normal mood and affect.    ED Course  Procedures (including critical care time) Labs Review Labs Reviewed - No data to display  Imaging Review No results found.   EKG Interpretation   Date/Time:  Sunday April 08 2014 13:32:19 EDT Ventricular Rate:  85 PR Interval:  125 QRS Duration: 103 QT Interval:  464 QTC Calculation: 552 R Axis:   29 Text Interpretation:  Ectopic atrial rhythm Nonspecific T abnormalities,  diffuse leads Prolonged QT interval No old tracing to compare Confirmed by  Zeniya Lapidus  MD, Sullivan Jacuinde ([id number]) on 04/08/2014 3:53:53 PM      MDM   Final diagnoses:  Allergic reaction, initial encounter    Patient presents with allergic reaction. She was monitored for about 3 hours here in the ED and had continuous improvement of symptoms. She was discharged home in good condition and given a prescription for a five-day course of prednisone as well as an EpiPen to use as  needed if she has recurrence of symptoms with future bee stings. She was advised to return here for symptoms worsen otherwise to follow up with her primary care physician for recheck.  I did leave a voice message for the patient that she had some abnormalities on her EKG that she needs to f/u with her PMD about.  Rolan Bucco, MD 04/08/14 1553  Rolan Bucco, MD 04/08/14 1554  Rolan Bucco, MD 04/08/14 1650

## 2014-04-08 NOTE — Discharge Instructions (Signed)

## 2014-04-08 NOTE — ED Notes (Signed)
Pt. States she feels a lot better. No SOB or difficulty breathing. Denies further needs at this time.

## 2014-04-08 NOTE — ED Notes (Addendum)
Pt in from [facility], per report pt was stung x 7 by bees in the ground (yellow & black  Bees per family report on bil arms & hands, [facility] & difficulty swallowing, pt denies hx of allergies to bees, hx of [place], pt on 3 L [place] upon arrival to ED, pt 98% on RA, pt received 75 mg Benadryl, pt rcvd x 2 Epi inj 0.3 mg each, x 2 Albuterol 5mg  & Atrovent 0.5 mg, depo-medrol 80 mg, 50 mg Zantac, pt A&O x4, follows commands, speaks in complete sentences

## 2014-04-10 ENCOUNTER — Other Ambulatory Visit: Payer: Self-pay | Admitting: Orthopedic Surgery

## 2014-04-10 DIAGNOSIS — M5410 Radiculopathy, site unspecified: Secondary | ICD-10-CM

## 2014-04-10 DIAGNOSIS — M542 Cervicalgia: Secondary | ICD-10-CM

## 2014-04-13 ENCOUNTER — Other Ambulatory Visit: Payer: 59

## 2014-04-15 ENCOUNTER — Ambulatory Visit
Admission: RE | Admit: 2014-04-15 | Discharge: 2014-04-15 | Disposition: A | Discharge status: Home / Self Care | Payer: BC Managed Care – PPO | Source: Ambulatory Visit | Attending: Orthopedic Surgery | Admitting: Orthopedic Surgery

## 2014-04-15 DIAGNOSIS — M542 Cervicalgia: Secondary | ICD-10-CM

## 2014-04-15 DIAGNOSIS — M5410 Radiculopathy, site unspecified: Secondary | ICD-10-CM

## 2014-06-27 ENCOUNTER — Emergency Department (HOSPITAL_COMMUNITY)
Admission: EM | Admit: 2014-06-27 | Discharge: 2014-06-27 | Disposition: A | Discharge status: Home / Self Care | Payer: BC Managed Care – PPO | Attending: Emergency Medicine | Admitting: Emergency Medicine

## 2014-06-27 ENCOUNTER — Emergency Department (HOSPITAL_COMMUNITY): Payer: BC Managed Care – PPO

## 2014-06-27 ENCOUNTER — Encounter (HOSPITAL_COMMUNITY): Payer: Self-pay | Admitting: Emergency Medicine

## 2014-06-27 DIAGNOSIS — R42 Dizziness and giddiness: Secondary | ICD-10-CM | POA: Diagnosis present

## 2014-06-27 DIAGNOSIS — J45909 Unspecified asthma, uncomplicated: Secondary | ICD-10-CM | POA: Diagnosis not present

## 2014-06-27 DIAGNOSIS — G43909 Migraine, unspecified, not intractable, without status migrainosus: Secondary | ICD-10-CM | POA: Insufficient documentation

## 2014-06-27 DIAGNOSIS — Z79899 Other long term (current) drug therapy: Secondary | ICD-10-CM | POA: Diagnosis not present

## 2014-06-27 DIAGNOSIS — H8110 Benign paroxysmal vertigo, unspecified ear: Secondary | ICD-10-CM | POA: Diagnosis not present

## 2014-06-27 DIAGNOSIS — N182 Chronic kidney disease, stage 2 (mild): Secondary | ICD-10-CM | POA: Diagnosis not present

## 2014-06-27 MED ORDER — KETOROLAC TROMETHAMINE 30 MG/ML IJ SOLN
30.0000 mg | Freq: Once | INTRAMUSCULAR | Status: AC
  Administered 2014-06-27: 30 mg via INTRAVENOUS
  Filled 2014-06-27: qty 1

## 2014-06-27 MED ORDER — LORAZEPAM 2 MG/ML IJ SOLN
1.0000 mg | Freq: Once | INTRAMUSCULAR | Status: AC
  Administered 2014-06-27: 1 mg via INTRAVENOUS
  Filled 2014-06-27: qty 1

## 2014-06-27 MED ORDER — MECLIZINE HCL 50 MG PO TABS: 25.0000 mg | ORAL_TABLET | Freq: Three times a day (TID) | ORAL | Status: DC | PRN

## 2014-06-27 MED ORDER — DIAZEPAM 5 MG PO TABS: 5.0000 mg | ORAL_TABLET | Freq: Two times a day (BID) | ORAL | Status: DC

## 2014-06-27 MED ORDER — METOCLOPRAMIDE HCL 5 MG/ML IJ SOLN
10.0000 mg | Freq: Once | INTRAMUSCULAR | Status: AC
  Administered 2014-06-27: 10 mg via INTRAVENOUS
  Filled 2014-06-27: qty 2

## 2014-06-27 MED ORDER — SODIUM CHLORIDE 0.9 % IV BOLUS (SEPSIS)
500.0000 mL | Freq: Once | INTRAVENOUS | Status: AC
  Administered 2014-06-27: 500 mL via INTRAVENOUS

## 2014-06-27 MED ORDER — DEXAMETHASONE SODIUM PHOSPHATE 10 MG/ML IJ SOLN
10.0000 mg | Freq: Once | INTRAMUSCULAR | Status: AC
  Administered 2014-06-27: 10 mg via INTRAVENOUS
  Filled 2014-06-27: qty 1

## 2014-06-27 MED ORDER — MECLIZINE HCL 25 MG PO TABS
25.0000 mg | ORAL_TABLET | Freq: Once | ORAL | Status: AC
  Administered 2014-06-27: 25 mg via ORAL
  Filled 2014-06-27: qty 1

## 2014-06-27 NOTE — Discharge Instructions (Signed)
Benign Positional Vertigo Vertigo means you feel like you or your surroundings are moving when they are not. Benign positional vertigo is the most common form of vertigo. Benign means that the cause of your condition is not serious. Benign positional vertigo is more common in older adults. CAUSES  Benign positional vertigo is the result of an upset in the labyrinth system. This is an area in the middle ear that helps control your balance. This may be caused by a viral infection, head injury, or repetitive motion. However, often no specific cause is found. SYMPTOMS  Symptoms of benign positional vertigo occur when you move your head or eyes in different directions. Some of the symptoms may include:  Loss of balance and falls.  Vomiting.  Blurred vision.  Dizziness.  Nausea.  Involuntary eye movements (nystagmus). DIAGNOSIS  Benign positional vertigo is usually diagnosed by physical exam. If the specific cause of your benign positional vertigo is unknown, your caregiver may perform imaging tests, such as magnetic resonance imaging (MRI) or computed tomography (CT). TREATMENT  Your caregiver may recommend movements or procedures to correct the benign positional vertigo. Medicines such as meclizine, benzodiazepines, and medicines for nausea may be used to treat your symptoms. In rare cases, if your symptoms are caused by certain conditions that affect the inner ear, you may need surgery. HOME CARE INSTRUCTIONS   Follow your caregiver's instructions.  Move slowly. Do not make sudden body or head movements.  Avoid driving.  Avoid operating heavy machinery.  Avoid performing any tasks that would be dangerous to you or others during a vertigo episode.  Drink enough fluids to keep your urine clear or pale yellow. SEEK IMMEDIATE MEDICAL CARE IF:   You develop problems with walking, weakness, numbness, or using your arms, hands, or legs.  You have difficulty speaking.  You develop  severe headaches.  Your nausea or vomiting continues or gets worse.  You develop visual changes.  Your family or friends notice any behavioral changes.  Your condition gets worse.  You have a fever.  You develop a stiff neck or sensitivity to light. MAKE SURE YOU:   Understand these instructions.  Will watch your condition.  Will get help right away if you are not doing well or get worse. Document Released: 06/01/2006 Document Revised: 11/16/2011 Document Reviewed: 05/14/2011 [facility] Patient Information 2015 [place]. This information is not intended to replace advice given to you by your health care provider. Make sure you discuss any questions you have with your health care provider.  Migraine Headache A migraine headache is an intense, throbbing pain on one or both sides of your head. A migraine can last for 30 minutes to several hours. CAUSES  The exact cause of a migraine headache is not always known. However, a migraine may be caused when nerves in the brain become irritated and release chemicals that cause inflammation. This causes pain. Certain things may also trigger migraines, such as:  Alcohol.  Smoking.  Stress.  Menstruation.  Aged cheeses.  Foods or drinks that contain nitrates, glutamate, aspartame, or tyramine.  Lack of sleep.  Chocolate.  Caffeine.  Hunger.  Physical exertion.  Fatigue.  Medicines used to treat chest pain (nitroglycerine), birth control pills, estrogen, and some blood pressure medicines. SIGNS AND SYMPTOMS  Pain on one or both sides of your head.  Pulsating or throbbing pain.  Severe pain that prevents daily activities.  Pain that is aggravated by any physical activity.  Nausea, vomiting, or both.  Dizziness.  Pain with exposure to bright lights, loud noises, or activity. °· General sensitivity to bright lights, loud noises, or smells. °Before you get a migraine, you may get warning signs that a migraine is  coming (aura). An aura may include: °· Seeing flashing lights. °· Seeing bright spots, halos, or zigzag lines. °· Having tunnel vision or blurred vision. °· Having feelings of numbness or tingling. °· Having trouble talking. °· Having muscle weakness. °DIAGNOSIS  °A migraine headache is often diagnosed based on: °· Symptoms. °· Physical exam. °· A CT scan or MRI of your head. These imaging tests cannot diagnose migraines, but they can help rule out other causes of headaches. °TREATMENT °Medicines may be given for pain and nausea. Medicines can also be given to help prevent recurrent migraines.  °HOME CARE INSTRUCTIONS °· Only take over-the-counter or prescription medicines for pain or discomfort as directed by your health care provider. The use of long-term narcotics is not recommended. °· Lie down in a dark, quiet room when you have a migraine. °· Keep a journal to find out what may trigger your migraine headaches. For example, write down: °¨ What you eat and drink. °¨ How much sleep you get. °¨ Any change to your diet or medicines. °· Limit alcohol consumption. °· Quit smoking if you smoke. °· Get 7-9 hours of sleep, or as recommended by your health care provider. °· Limit stress. °· Keep lights dim if bright lights bother you and make your migraines worse. °SEEK IMMEDIATE MEDICAL CARE IF:  °· Your migraine becomes severe. °· You have a fever. °· You have a stiff neck. °· You have vision loss. °· You have muscular weakness or loss of muscle control. °· You start losing your balance or have trouble walking. °· You feel faint or pass out. °· You have severe symptoms that are different from your first symptoms. °MAKE SURE YOU:  °· Understand these instructions. °· Will watch your condition. °· Will get help right away if you are not doing well or get worse. °Document Released: 08/24/2005 Document Revised: 01/08/2014 Document Reviewed: 05/01/2013 °ExitCare® Patient Information ©2015 [employer], LLC. This information is  not intended to replace advice given to you by your health care provider. Make sure you discuss any questions you have with your health care provider. ° °

## 2014-06-27 NOTE — ED Notes (Signed)
Per pt sts she woke up this am with migraine, dizziness and ringing in her ears.

## 2014-06-27 NOTE — ED Notes (Signed)
Courtney, PA at bedside.  

## 2014-06-27 NOTE — ED Notes (Signed)
Walked with  [employer] assistance 23 ft

## 2014-06-27 NOTE — ED Provider Notes (Signed)
CSN: [id number]     Arrival date & time 06/27/14  1009 History   First MD Initiated Contact with Patient 06/27/14 1116     Chief Complaint  Patient presents with  . Migraine  . Dizziness   Patient is a 53 y.o. female presenting with migraines and dizziness.  Migraine Associated symptoms include headaches and nausea. Pertinent negatives include no abdominal pain, chest pain, chills, coughing, fatigue, fever, numbness, vomiting or weakness.  Dizziness Associated symptoms: headaches and nausea   Associated symptoms: no chest pain, no diarrhea, no palpitations, no shortness of breath and no vomiting     Patient is a 53 y.o. Female who presents to the ED with headache and dizziness. Per the patient she was woken up several times last night when she rolled over with the sensation of dizziness and some nausea.  Patient states that she was able to fall back asleep.  She continued to have mild dizziness constantly which she describes feels like she is spinning and it is worse with movement.  Patient then tried going to work and developed a severe headache which is right sided and feels like tightening and sqeezing.  Patient has a history of migraine headaches but this headaches feels much different to her.  Patient is experiencing some ringing in her left ear and occasional buzzing in her head.  Patient does have a history of hypertension and states that when she saw her work nurse her blood pressure was 150/110 and she was told to come here.  Patient has no history of vertigo.  Patient has tried no relieving factors at this time for her headache.    Past Medical History  Diagnosis Date  . Asthma   . Kidney disease, chronic, stage II (GFR 60-89 ml/min)    Past Surgical History  Procedure Laterality Date  . Spinal fusion    . Bladder augmentation    . Knee surgery    . Hernia repair     History reviewed. No pertinent family history. History  Substance Use Topics  . Smoking status: Never Smoker    . Smokeless tobacco: Never Used  . Alcohol Use: No   OB History   Grav Para Term Preterm Abortions TAB SAB Ect Mult Living                 Review of Systems  Constitutional: Negative for fever, chills and fatigue.  Respiratory: Negative for cough, chest tightness and shortness of breath.   Cardiovascular: Negative for chest pain, palpitations and leg swelling.  Gastrointestinal: Positive for nausea. Negative for vomiting, abdominal pain, diarrhea and constipation.  Genitourinary: Negative for dysuria, urgency and frequency.  Musculoskeletal: Positive for gait problem.  Neurological: Positive for dizziness and headaches. Negative for tremors, seizures, speech difficulty, weakness, light-headedness and numbness.  All other systems reviewed and are negative.     Allergies  Codeine and Mushroom extract complex  Home Medications   Prior to Admission medications   Medication Sig Start Date End Date Taking? Authorizing Provider  albuterol (PROVENTIL HFA;VENTOLIN HFA) 108 (90 BASE) MCG/ACT inhaler Inhale 2 puffs into the lungs every 6 (six) hours as needed for wheezing or shortness of breath.   Yes Historical Provider, MD  gabapentin (NEURONTIN) 300 MG capsule Take 300 mg by mouth at bedtime.   Yes Historical Provider, MD  Sertraline HCl (ZOLOFT PO) Take 100 mg by mouth every evening.    Yes Historical Provider, MD  Tapentadol HCl (NUCYNTA ER PO) Take 50 mg by mouth  daily as needed (pain).    Yes Historical Provider, MD  EPINEPHrine (EPIPEN) 0.3 mg/0.3 mL IJ SOAJ injection Inject 0.3 mLs (0.3 mg total) into the muscle as needed. 04/08/14   Rolan Bucco, MD   BP 117/72  Pulse 85  Temp(Src) 97.6 F (36.4 C) (Oral)  Resp 18  SpO2 97% Physical Exam  Nursing note and vitals reviewed. Constitutional: She is oriented to person, place, and time. She appears well-developed and well-nourished. No distress.  HENT:  Head: Normocephalic and atraumatic.  Right Ear: Hearing, tympanic membrane  and external ear normal.  Left Ear: Hearing, tympanic membrane and external ear normal.  Mouth/Throat: Oropharynx is clear and moist. No oropharyngeal exudate.  Eyes: Conjunctivae and EOM are normal. Pupils are equal, round, and reactive to light. No scleral icterus.  Neck: Normal range of motion. Neck supple. No JVD present. No thyromegaly present.  Cardiovascular: Normal rate, regular rhythm, normal heart sounds and intact distal pulses.  Exam reveals no gallop and no friction rub.   No murmur heard. Pulmonary/Chest: Effort normal and breath sounds normal. No respiratory distress. She has no wheezes. She has no rales. She exhibits no tenderness.  Abdominal: Soft. Bowel sounds are normal. She exhibits no distension and no mass. There is no tenderness. There is no rebound and no guarding.  Lymphadenopathy:    She has no cervical adenopathy.  Neurological: She is alert and oriented to person, place, and time. She has normal strength. No cranial nerve deficit or sensory deficit. Coordination normal. GCS eye subscore is 4. GCS verbal subscore is 5. GCS motor subscore is 6.  Negative pronator drift of the arms and legs.    Skin: Skin is warm and dry. She is not diaphoretic.  Psychiatric: She has a normal mood and affect. Her behavior is normal. Judgment and thought content normal.    ED Course  Procedures (including critical care time) Labs Review Labs Reviewed - No data to display  Imaging Review Ct Head Wo Contrast  06/27/2014   CLINICAL DATA:  Migraine, dizziness, and ringing in the ears beginning this morning.  EXAM: CT HEAD WITHOUT CONTRAST  TECHNIQUE: Contiguous axial images were obtained from the base of the skull through the vertex without intravenous contrast.  COMPARISON:  12/15/2010  FINDINGS: There is no evidence of acute cortical infarct, intracranial hemorrhage, mass, midline shift, or extra-axial fluid collection. The ventricles and sulci are normal for age. Subcentimeter, subtle  focus of hypoattenuation in the anterior limb of the right internal capsule is unchanged and nonspecific but may reflect minimal chronic small vessel ischemia.  Orbits are unremarkable. There is trace left mastoid fluid. Visualized paranasal sinuses are clear.  IMPRESSION: 1. No evidence of acute intracranial abnormality. 2. Trace left mastoid fluid.   Electronically Signed   By: Sebastian Ache   On: 06/27/2014 13:28     EKG Interpretation None      MDM   Final diagnoses:  Vertigo, benign paroxysmal, unspecified laterality  Migraine without status migrainosus, not intractable, unspecified migraine type   Patient is a 53 y.o. Female who presents to the ED with vertigo, headache, and nausea.  Physical exam reveals no focal neurological deficits or evidence of meningismus.  Given that headache was different than baseline headaches and new onset of vertigo head CT performed which was negative for acute brain bleeding.  There is some evidence of trace fluid in the left mastoid.  Patient treated here with meclizine, headache cocktail, and ativan.  Patient had great relief  from her headache, but was still having mild dizziness.  Patient walked with a mildly unsteady gait.  Patient rewalked again with a steady gait.  Patient is stable for discharge.  Patient to be sent home with Meclizine and Valium for dizziness at home.  Patient to return for intractable nausea, vomiting, and dizziness or any other concerning symptoms.  Patient states understanding and agreement.  Patient was also seen by Dr. Patria Maneampos who agrees with the above workup and plan.       Eben Burowourtney A Forcucci, PA-C 06/27/14 1530

## 2014-06-27 NOTE — ED Notes (Signed)
Pt. Ambulated in hall. Gait unsteady, patient states she is tired. Reports mild dizziness and mild HA.

## 2014-06-27 NOTE — ED Notes (Signed)
When reassess patient, patient reports anxiety, fidgeting in bed. PA notified.

## 2014-06-27 NOTE — ED Provider Notes (Signed)
Medical screening examination/treatment/procedure(s) were conducted as a shared visit with non-physician practitioner(s) and myself.  I personally evaluated the patient during the encounter.   EKG Interpretation None      Feels better after treatment in ER. Ambulated in ER. Dc home. Peripheral vertigo. Doubt central vertigo. pcp follow up. Home with antivert and valium  Lyanne CoKevin M Elenore Wanninger, MD 06/27/14 [phone]

## 2016-07-28 ENCOUNTER — Other Ambulatory Visit: Payer: Self-pay | Admitting: Family Medicine

## 2016-07-28 DIAGNOSIS — M5030 Other cervical disc degeneration, unspecified cervical region: Secondary | ICD-10-CM

## 2016-07-28 DIAGNOSIS — R51 Headache: Principal | ICD-10-CM

## 2016-07-28 DIAGNOSIS — R519 Headache, unspecified: Secondary | ICD-10-CM

## 2016-07-29 ENCOUNTER — Other Ambulatory Visit: Payer: Self-pay

## 2016-08-12 ENCOUNTER — Ambulatory Visit
Admission: RE | Admit: 2016-08-12 | Discharge: 2016-08-12 | Disposition: A | Discharge status: Home / Self Care | Payer: BLUE CROSS/BLUE SHIELD | Source: Ambulatory Visit | Attending: Family Medicine | Admitting: Family Medicine

## 2016-08-12 DIAGNOSIS — R51 Headache: Principal | ICD-10-CM

## 2016-08-12 DIAGNOSIS — M5030 Other cervical disc degeneration, unspecified cervical region: Secondary | ICD-10-CM

## 2016-08-12 DIAGNOSIS — R519 Headache, unspecified: Secondary | ICD-10-CM

## 2016-08-12 MED ORDER — GADOBENATE DIMEGLUMINE 529 MG/ML IV SOLN
12.0000 mL | Freq: Once | INTRAVENOUS | Status: AC | PRN
  Administered 2016-08-12: 12 mL via INTRAVENOUS

## 2017-07-09 DIAGNOSIS — Z23 Encounter for immunization: Secondary | ICD-10-CM | POA: Diagnosis not present

## 2017-07-09 DIAGNOSIS — N183 Chronic kidney disease, stage 3 (moderate): Secondary | ICD-10-CM | POA: Diagnosis not present

## 2017-07-09 DIAGNOSIS — N390 Urinary tract infection, site not specified: Secondary | ICD-10-CM | POA: Diagnosis not present

## 2017-07-15 DIAGNOSIS — E039 Hypothyroidism, unspecified: Secondary | ICD-10-CM | POA: Diagnosis not present

## 2017-07-15 DIAGNOSIS — E785 Hyperlipidemia, unspecified: Secondary | ICD-10-CM | POA: Diagnosis not present

## 2017-07-15 DIAGNOSIS — Z79899 Other long term (current) drug therapy: Secondary | ICD-10-CM | POA: Diagnosis not present

## 2017-08-04 DIAGNOSIS — M961 Postlaminectomy syndrome, not elsewhere classified: Secondary | ICD-10-CM | POA: Diagnosis not present

## 2017-08-04 DIAGNOSIS — M47812 Spondylosis without myelopathy or radiculopathy, cervical region: Secondary | ICD-10-CM | POA: Diagnosis not present

## 2017-08-04 DIAGNOSIS — M5481 Occipital neuralgia: Secondary | ICD-10-CM | POA: Diagnosis not present

## 2017-08-04 DIAGNOSIS — M25552 Pain in left hip: Secondary | ICD-10-CM | POA: Diagnosis not present

## 2017-09-06 DIAGNOSIS — N183 Chronic kidney disease, stage 3 (moderate): Secondary | ICD-10-CM | POA: Diagnosis not present

## 2017-09-15 DIAGNOSIS — L570 Actinic keratosis: Secondary | ICD-10-CM | POA: Diagnosis not present

## 2017-09-27 ENCOUNTER — Other Ambulatory Visit: Payer: Self-pay | Admitting: Anesthesiology

## 2017-09-27 ENCOUNTER — Ambulatory Visit
Admission: RE | Admit: 2017-09-27 | Discharge: 2017-09-27 | Disposition: A | Discharge status: Home / Self Care | Payer: BLUE CROSS/BLUE SHIELD | Source: Ambulatory Visit | Attending: Anesthesiology | Admitting: Anesthesiology

## 2017-09-27 DIAGNOSIS — M25552 Pain in left hip: Secondary | ICD-10-CM | POA: Diagnosis not present

## 2017-09-27 DIAGNOSIS — M961 Postlaminectomy syndrome, not elsewhere classified: Secondary | ICD-10-CM | POA: Diagnosis not present

## 2017-09-27 DIAGNOSIS — M25551 Pain in right hip: Secondary | ICD-10-CM

## 2017-09-27 DIAGNOSIS — M47812 Spondylosis without myelopathy or radiculopathy, cervical region: Secondary | ICD-10-CM | POA: Diagnosis not present

## 2017-09-27 DIAGNOSIS — M160 Bilateral primary osteoarthritis of hip: Secondary | ICD-10-CM | POA: Diagnosis not present

## 2017-09-29 DIAGNOSIS — E785 Hyperlipidemia, unspecified: Secondary | ICD-10-CM | POA: Diagnosis not present

## 2017-09-29 DIAGNOSIS — Z79899 Other long term (current) drug therapy: Secondary | ICD-10-CM | POA: Diagnosis not present

## 2017-11-15 DIAGNOSIS — F411 Generalized anxiety disorder: Secondary | ICD-10-CM | POA: Diagnosis not present

## 2017-11-15 DIAGNOSIS — R82998 Other abnormal findings in urine: Secondary | ICD-10-CM | POA: Diagnosis not present

## 2017-11-15 DIAGNOSIS — N183 Chronic kidney disease, stage 3 (moderate): Secondary | ICD-10-CM | POA: Diagnosis not present

## 2017-11-15 DIAGNOSIS — N3090 Cystitis, unspecified without hematuria: Secondary | ICD-10-CM | POA: Diagnosis not present

## 2017-11-15 DIAGNOSIS — E785 Hyperlipidemia, unspecified: Secondary | ICD-10-CM | POA: Diagnosis not present

## 2017-11-15 DIAGNOSIS — F324 Major depressive disorder, single episode, in partial remission: Secondary | ICD-10-CM | POA: Diagnosis not present

## 2017-11-16 DIAGNOSIS — R7301 Impaired fasting glucose: Secondary | ICD-10-CM | POA: Diagnosis not present

## 2017-11-18 DIAGNOSIS — R7301 Impaired fasting glucose: Secondary | ICD-10-CM | POA: Diagnosis not present

## 2017-11-22 DIAGNOSIS — M961 Postlaminectomy syndrome, not elsewhere classified: Secondary | ICD-10-CM | POA: Diagnosis not present

## 2017-11-22 DIAGNOSIS — M25551 Pain in right hip: Secondary | ICD-10-CM | POA: Diagnosis not present

## 2017-11-22 DIAGNOSIS — M47812 Spondylosis without myelopathy or radiculopathy, cervical region: Secondary | ICD-10-CM | POA: Diagnosis not present

## 2017-11-22 DIAGNOSIS — M25552 Pain in left hip: Secondary | ICD-10-CM | POA: Diagnosis not present

## 2018-01-17 DIAGNOSIS — M5432 Sciatica, left side: Secondary | ICD-10-CM | POA: Diagnosis not present

## 2018-01-17 DIAGNOSIS — F324 Major depressive disorder, single episode, in partial remission: Secondary | ICD-10-CM | POA: Diagnosis not present

## 2018-01-17 DIAGNOSIS — R079 Chest pain, unspecified: Secondary | ICD-10-CM | POA: Diagnosis not present

## 2018-01-17 DIAGNOSIS — M419 Scoliosis, unspecified: Secondary | ICD-10-CM | POA: Diagnosis not present

## 2018-01-21 DIAGNOSIS — M47812 Spondylosis without myelopathy or radiculopathy, cervical region: Secondary | ICD-10-CM | POA: Diagnosis not present

## 2018-01-21 DIAGNOSIS — M961 Postlaminectomy syndrome, not elsewhere classified: Secondary | ICD-10-CM | POA: Diagnosis not present

## 2018-01-21 DIAGNOSIS — M25552 Pain in left hip: Secondary | ICD-10-CM | POA: Diagnosis not present

## 2018-01-21 DIAGNOSIS — M5481 Occipital neuralgia: Secondary | ICD-10-CM | POA: Diagnosis not present

## 2018-02-17 DIAGNOSIS — M4125 Other idiopathic scoliosis, thoracolumbar region: Secondary | ICD-10-CM | POA: Diagnosis not present

## 2018-02-17 DIAGNOSIS — G894 Chronic pain syndrome: Secondary | ICD-10-CM | POA: Diagnosis not present

## 2018-02-17 DIAGNOSIS — E785 Hyperlipidemia, unspecified: Secondary | ICD-10-CM | POA: Diagnosis not present

## 2018-02-17 DIAGNOSIS — F324 Major depressive disorder, single episode, in partial remission: Secondary | ICD-10-CM | POA: Diagnosis not present

## 2018-02-17 DIAGNOSIS — E039 Hypothyroidism, unspecified: Secondary | ICD-10-CM | POA: Diagnosis not present

## 2018-02-23 ENCOUNTER — Encounter (INDEPENDENT_AMBULATORY_CARE_PROVIDER_SITE_OTHER): Payer: Self-pay | Admitting: Specialist

## 2018-02-23 ENCOUNTER — Ambulatory Visit (INDEPENDENT_AMBULATORY_CARE_PROVIDER_SITE_OTHER): Payer: BLUE CROSS/BLUE SHIELD

## 2018-02-23 ENCOUNTER — Ambulatory Visit (INDEPENDENT_AMBULATORY_CARE_PROVIDER_SITE_OTHER): Payer: BLUE CROSS/BLUE SHIELD | Admitting: Specialist

## 2018-02-23 VITALS — BP 130/88 | HR 68 | Ht 62.5 in | Wt 143.0 lb

## 2018-02-23 DIAGNOSIS — G8929 Other chronic pain: Secondary | ICD-10-CM

## 2018-02-23 DIAGNOSIS — M4012 Other secondary kyphosis, cervical region: Secondary | ICD-10-CM

## 2018-02-23 DIAGNOSIS — M4802 Spinal stenosis, cervical region: Secondary | ICD-10-CM

## 2018-02-23 DIAGNOSIS — Z4889 Encounter for other specified surgical aftercare: Secondary | ICD-10-CM | POA: Diagnosis not present

## 2018-02-23 DIAGNOSIS — R102 Pelvic and perineal pain: Secondary | ICD-10-CM | POA: Diagnosis not present

## 2018-02-23 DIAGNOSIS — M4325 Fusion of spine, thoracolumbar region: Secondary | ICD-10-CM | POA: Diagnosis not present

## 2018-02-23 DIAGNOSIS — M5442 Lumbago with sciatica, left side: Secondary | ICD-10-CM | POA: Diagnosis not present
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
## 2018-02-23 NOTE — Patient Instructions (Addendum)
Fall Prevention and Home Safety Falls cause injuries and can affect all age groups. It is possible to use preventive measures to significantly decrease the likelihood of falls. There are many simple measures which can make your home safer and prevent falls. OUTDOORS  Repair cracks and edges of walkways and driveways.  Remove high doorway thresholds.  Trim shrubbery on the main path into your home.  Have good outside lighting.  Clear walkways of tools, rocks, debris, and clutter.  Check that handrails are not broken and are securely fastened. Both sides of steps should have handrails.  Have leaves, snow, and ice cleared regularly.  Use sand or salt on walkways during winter months.  In the garage, clean up grease or oil spills. BATHROOM  Install night lights.  Install grab bars by the toilet and in the tub and shower.  Use non-skid mats or decals in the tub or shower.  Place a plastic non-slip stool in the shower to sit on, if needed.  Keep floors dry and clean up all water on the floor immediately.  Remove soap buildup in the tub or shower on a regular basis.  Secure bath mats with non-slip, double-sided rug tape.  Remove throw rugs and tripping hazards from the floors. BEDROOMS  Install night lights.  Make sure a bedside light is easy to reach.  Do not use oversized bedding.  Keep a telephone by your bedside.  Have a firm chair with side arms to use for getting dressed.  Remove throw rugs and tripping hazards from the floor. KITCHEN  Keep handles on pots and pans turned toward the center of the stove. Use back burners when possible.  Clean up spills quickly and allow time for drying.  Avoid walking on wet floors.  Avoid hot utensils and knives.  Position shelves so they are not too high or low.  Place commonly used objects within easy reach.  If necessary, use a sturdy step stool with a grab bar when reaching.  Keep electrical cables out of the  way.  Do not use floor polish or wax that makes floors slippery. If you must use wax, use non-skid floor wax.  Remove throw rugs and tripping hazards from the floor. STAIRWAYS  Never leave objects on stairs.  Place handrails on both sides of stairways and use them. Fix any loose handrails. Make sure handrails on both sides of the stairways are as long as the stairs.  Check carpeting to make sure it is firmly attached along stairs. Make repairs to worn or loose carpet promptly.  Avoid placing throw rugs at the top or bottom of stairways, or properly secure the rug with carpet tape to prevent slippage. Get rid of throw rugs, if possible.  Have an electrician put in a light switch at the top and bottom of the stairs. OTHER FALL PREVENTION TIPS  Wear low-heel or rubber-soled shoes that are supportive and fit well. Wear closed toe shoes.  When using a stepladder, make sure it is fully opened and both spreaders are firmly locked. Do not climb a closed stepladder.  Add color or contrast paint or tape to grab bars and handrails in your home. Place contrasting color strips on first and last steps.  Learn and use mobility aids as needed. Install an electrical emergency response system.  Turn on lights to avoid dark areas. Replace light bulbs that burn out immediately. Get light switches that glow.  Arrange furniture to create clear pathways. Keep furniture in the same place.    Firmly attach carpet with non-skid or double-sided tape.  Eliminate uneven floor surfaces.  Select a carpet pattern that does not visually hide the edge of steps.  Be aware of all pets. OTHER HOME SAFETY TIPS  Set the water temperature for 120 F (48.8 C).  Keep emergency numbers on or near the telephone.  Keep smoke detectors on every level of the home and near sleeping areas. Document Released: 08/14/2002 Document Revised: 02/23/2012 Document Reviewed: 11/13/2011 [facility] Patient Information 2014  [place]. Avoid frequent bending and stooping  No lifting greater than 10 lbs. May use ice or moist heat for pain. Weight loss is of benefit. Handicap license is approved.  Avoid overhead lifting and overhead use of the arms. Do not lift greater than 5 lbs. Adjust head rest in vehicle to prevent hyperextension if rear ended. Take extra precautions to avoid falling.

## 2018-02-24 LAB — B12 AND FOLATE PANEL
Folate: 6.8 ng/mL
Vitamin B-12: 230 pg/mL (ref 200–1100)

## 2018-02-24 LAB — VITAMIN D 25 HYDROXY (VIT D DEFICIENCY, FRACTURES): Vit D, 25-Hydroxy: 15 ng/mL — ABNORMAL LOW (ref 30–100)

## 2018-03-03 ENCOUNTER — Other Ambulatory Visit (INDEPENDENT_AMBULATORY_CARE_PROVIDER_SITE_OTHER): Payer: Self-pay | Admitting: Specialist

## 2018-03-03 ENCOUNTER — Telehealth (INDEPENDENT_AMBULATORY_CARE_PROVIDER_SITE_OTHER): Payer: Self-pay | Admitting: Specialist

## 2018-03-03 MED ORDER — VITAMIN D (ERGOCALCIFEROL) 1.25 MG (50000 UNIT) PO CAPS: 50000.0000 [IU] | ORAL_CAPSULE | ORAL | 1 refills | Status: DC

## 2018-03-03 NOTE — Telephone Encounter (Signed)
Vitamin D level is low, Vitamin B12 is low normal. I will send Rx to er pharmacy for Vitamin D replacement. jen

## 2018-03-03 NOTE — Telephone Encounter (Signed)
Patient left voicemail requesting a call back regarding the lab results, she has concerns and wants information how she can fix or improve things with her care.  

## 2018-03-03 NOTE — Telephone Encounter (Signed)
I called and gave her the results, she will pick up the rx this evening

## 2018-03-03 NOTE — Telephone Encounter (Signed)
Patient left voicemail requesting a call back regarding the lab results, she has concerns and wants information how she can fix or improve things with her care.

## 2018-03-14 DIAGNOSIS — M4802 Spinal stenosis, cervical region: Secondary | ICD-10-CM | POA: Diagnosis not present

## 2018-03-14 DIAGNOSIS — M542 Cervicalgia: Secondary | ICD-10-CM | POA: Diagnosis not present

## 2018-03-14 DIAGNOSIS — M25552 Pain in left hip: Secondary | ICD-10-CM | POA: Diagnosis not present

## 2018-03-16 ENCOUNTER — Ambulatory Visit
Admission: RE | Admit: 2018-03-16 | Discharge: 2018-03-16 | Disposition: A | Discharge status: Home / Self Care | Payer: BLUE CROSS/BLUE SHIELD | Source: Ambulatory Visit | Attending: Specialist | Admitting: Specialist

## 2018-03-16 DIAGNOSIS — S3992XA Unspecified injury of lower back, initial encounter: Secondary | ICD-10-CM | POA: Diagnosis not present

## 2018-03-16 DIAGNOSIS — G8929 Other chronic pain: Secondary | ICD-10-CM

## 2018-03-16 DIAGNOSIS — M546 Pain in thoracic spine: Secondary | ICD-10-CM | POA: Diagnosis not present

## 2018-03-16 DIAGNOSIS — R102 Pelvic and perineal pain: Secondary | ICD-10-CM | POA: Diagnosis not present

## 2018-03-16 DIAGNOSIS — Z4889 Encounter for other specified surgical aftercare: Secondary | ICD-10-CM

## 2018-03-16 DIAGNOSIS — M549 Dorsalgia, unspecified: Secondary | ICD-10-CM | POA: Diagnosis not present

## 2018-03-16 DIAGNOSIS — S3993XA Unspecified injury of pelvis, initial encounter: Secondary | ICD-10-CM | POA: Diagnosis not present

## 2018-03-16 DIAGNOSIS — M4325 Fusion of spine, thoracolumbar region: Secondary | ICD-10-CM

## 2018-03-16 DIAGNOSIS — M4012 Other secondary kyphosis, cervical region: Secondary | ICD-10-CM

## 2018-03-16 DIAGNOSIS — S299XXA Unspecified injury of thorax, initial encounter: Secondary | ICD-10-CM | POA: Diagnosis not present

## 2018-03-16 DIAGNOSIS — M5442 Lumbago with sciatica, left side: Principal | ICD-10-CM

## 2018-03-17 ENCOUNTER — Other Ambulatory Visit (INDEPENDENT_AMBULATORY_CARE_PROVIDER_SITE_OTHER): Payer: Self-pay | Admitting: Specialist

## 2018-03-17 DIAGNOSIS — M25552 Pain in left hip: Secondary | ICD-10-CM | POA: Diagnosis not present

## 2018-03-17 DIAGNOSIS — M542 Cervicalgia: Secondary | ICD-10-CM | POA: Diagnosis not present

## 2018-03-17 DIAGNOSIS — M4802 Spinal stenosis, cervical region: Secondary | ICD-10-CM | POA: Diagnosis not present

## 2018-03-21 DIAGNOSIS — M542 Cervicalgia: Secondary | ICD-10-CM | POA: Diagnosis not present

## 2018-03-21 DIAGNOSIS — M25552 Pain in left hip: Secondary | ICD-10-CM | POA: Diagnosis not present

## 2018-03-21 DIAGNOSIS — M4802 Spinal stenosis, cervical region: Secondary | ICD-10-CM | POA: Diagnosis not present

## 2018-03-22 ENCOUNTER — Telehealth (INDEPENDENT_AMBULATORY_CARE_PROVIDER_SITE_OTHER): Payer: Self-pay | Admitting: Specialist

## 2018-03-22 NOTE — Telephone Encounter (Signed)
Patient wondering if CAT scan results have been received. She states her physical therapist is waiting to see what next steps are.

## 2018-03-22 NOTE — Telephone Encounter (Signed)
Patient wondering if CAT scan results have been received. She states her physical therapist is waiting to see what next steps are. Patients # [phone]

## 2018-03-23 DIAGNOSIS — M542 Cervicalgia: Secondary | ICD-10-CM | POA: Diagnosis not present

## 2018-03-23 DIAGNOSIS — M4802 Spinal stenosis, cervical region: Secondary | ICD-10-CM | POA: Diagnosis not present

## 2018-03-23 DIAGNOSIS — M25552 Pain in left hip: Secondary | ICD-10-CM | POA: Diagnosis not present

## 2018-03-25 DIAGNOSIS — M25552 Pain in left hip: Secondary | ICD-10-CM | POA: Diagnosis not present

## 2018-03-25 DIAGNOSIS — M5481 Occipital neuralgia: Secondary | ICD-10-CM | POA: Diagnosis not present

## 2018-03-25 DIAGNOSIS — M961 Postlaminectomy syndrome, not elsewhere classified: Secondary | ICD-10-CM | POA: Diagnosis not present

## 2018-03-25 DIAGNOSIS — M47812 Spondylosis without myelopathy or radiculopathy, cervical region: Secondary | ICD-10-CM | POA: Diagnosis not present

## 2018-04-04 DIAGNOSIS — M542 Cervicalgia: Secondary | ICD-10-CM | POA: Diagnosis not present

## 2018-04-04 DIAGNOSIS — M25552 Pain in left hip: Secondary | ICD-10-CM | POA: Diagnosis not present

## 2018-04-04 DIAGNOSIS — M4802 Spinal stenosis, cervical region: Secondary | ICD-10-CM | POA: Diagnosis not present

## 2018-04-06 ENCOUNTER — Ambulatory Visit (INDEPENDENT_AMBULATORY_CARE_PROVIDER_SITE_OTHER): Payer: BLUE CROSS/BLUE SHIELD | Admitting: Specialist

## 2018-04-06 ENCOUNTER — Encounter (INDEPENDENT_AMBULATORY_CARE_PROVIDER_SITE_OTHER): Payer: Self-pay | Admitting: Specialist

## 2018-04-06 VITALS — BP 126/86 | HR 80 | Ht 62.5 in | Wt 143.0 lb

## 2018-04-06 DIAGNOSIS — M4325 Fusion of spine, thoracolumbar region: Secondary | ICD-10-CM | POA: Diagnosis not present

## 2018-04-06 DIAGNOSIS — M1612 Unilateral primary osteoarthritis, left hip: Secondary | ICD-10-CM

## 2018-04-06 NOTE — Progress Notes (Signed)
   Office Visit Note   Patient: Nicole LoserCathy Page-Shinn           Date of Birth: [birth year]           MRN: [id number] Visit Date: 04/06/2018              Requested by: Laurann MontanaWhite, Cynthia, MD [phone] [address] [place], [place] [id number] PCP: Laurann MontanaWhite, Cynthia, MD   Assessment & Plan: Visit Diagnoses:  1. Unilateral primary osteoarthritis, left hip   2. Fusion of spine of thoracolumbar region     Plan: The main ways of treat osteoarthritis, that are found to be success. Weight loss helps to decrease pain. Exercise is important to maintaining cartilage and thickness and strengthening. You are unable to take NSAIDs like motrin, tylenol, alleve are meds decreasing the inflamation due to renal changes. Ice is okay  In afternoon and evening and hot shower in the am   Follow-Up Instructions: Return in about 1 month (around 05/04/2018) for Appointment to see Dr. Magnus IvanBlackman.   Orders:  No orders of the defined types were placed in this encounter.  No orders of the defined types were placed in this encounter.     Procedures: No procedures performed   Clinical Data: No additional findings.   Subjective: Chief Complaint  Patient presents with  . Lower Back - Follow-up    HPI  Review of Systems   Objective: Vital Signs: BP 126/86 (BP Location: Left Arm, Patient Position: Sitting)   Pulse 80   Ht 5' 2.5" (1.588 m)   Wt 143 lb (64.9 kg)   BMI 25.74 kg/m   Physical Exam  Ortho Exam  Specialty Comments:  No specialty comments available.  Imaging: No results found.   PMFS History: Patient Active Problem List   Diagnosis Date Noted  . Epicondylitis elbow, medial 12/17/2011  . Lateral epicondylitis 01/09/2011  . BURSITIS, HIP 07/22/2010  . LEG PAIN, RIGHT 05/22/2010  . UNEQUAL LEG LENGTH 05/22/2010  . SCOLIOSIS, LUMBAR SPINE 05/22/2010  . ABNORMALITY OF GAIT 05/22/2010   Past Medical History:  Diagnosis Date  . Asthma   . Kidney disease, chronic, stage II (GFR  60-89 ml/min)     No family history on file.  Past Surgical History:  Procedure Laterality Date  . BLADDER AUGMENTATION    . HERNIA REPAIR    . KNEE SURGERY    . SPINAL FUSION     Social History   Occupational History  . Not on file  Tobacco Use  . Smoking status: Never Smoker  . Smokeless tobacco: Never Used  Substance and Sexual Activity  . Alcohol use: No  . Drug use: No  . Sexual activity: Yes

## 2018-04-06 NOTE — Patient Instructions (Signed)
The main ways of treat osteoarthritis, that are found to be success. Weight loss helps to decrease pain. Exercise is important to maintaining cartilage and thickness and strengthening. You are unable to take NSAIDs like motrin, tylenol, alleve are meds decreasing the inflamation due to renal changes. Ice is okay  In afternoon and evening and hot shower in the am

## 2018-04-07 ENCOUNTER — Encounter (INDEPENDENT_AMBULATORY_CARE_PROVIDER_SITE_OTHER): Payer: Self-pay | Admitting: Specialist

## 2018-04-07 DIAGNOSIS — M542 Cervicalgia: Secondary | ICD-10-CM | POA: Diagnosis not present

## 2018-04-07 DIAGNOSIS — M4802 Spinal stenosis, cervical region: Secondary | ICD-10-CM | POA: Diagnosis not present

## 2018-04-07 DIAGNOSIS — M25552 Pain in left hip: Secondary | ICD-10-CM | POA: Diagnosis not present

## 2018-04-20 ENCOUNTER — Other Ambulatory Visit (INDEPENDENT_AMBULATORY_CARE_PROVIDER_SITE_OTHER): Payer: Self-pay | Admitting: Specialist

## 2018-04-20 NOTE — Telephone Encounter (Signed)
Vitamin D2 Refill request

## 2018-04-26 ENCOUNTER — Encounter (INDEPENDENT_AMBULATORY_CARE_PROVIDER_SITE_OTHER): Payer: Self-pay | Admitting: Orthopaedic Surgery

## 2018-04-26 ENCOUNTER — Ambulatory Visit (INDEPENDENT_AMBULATORY_CARE_PROVIDER_SITE_OTHER): Payer: BLUE CROSS/BLUE SHIELD | Admitting: Orthopaedic Surgery

## 2018-04-26 DIAGNOSIS — M1612 Unilateral primary osteoarthritis, left hip: Secondary | ICD-10-CM

## 2018-04-26 NOTE — Progress Notes (Signed)
Office Visit Note   Patient: Nicole Morris           Date of Birth: 05/12/1961           MRN: [id number] Visit Date: 04/26/2018              Requested by: Laurann MontanaWhite, Cynthia, MD [phone] [address] [place], [place] [address] PCP: Laurann MontanaWhite, Cynthia, MD   Assessment & Plan: Visit Diagnoses:  1. Unilateral primary osteoarthritis, left hip     Plan: We did go over x-rays in detail and her clinical exam.  I do feel she is a candidate for hip replacement surgery.  We would need to be careful to work on her leg lengths and making sure that we anteverted the cup enough given the thrust of her pelvis with sacral fusion.  I showed her hip model explained in detail what the surgery involves.  We a long and thorough discussion about the risk and benefits of surgery as well.  We talked about her intraoperative and postoperative course.  At this point she does wish to proceed with the surgery so we will work on getting it scheduled.  All questions concerns were answered and addressed.  Follow-Up Instructions: Return for 2 weeks post-op.   Orders:  No orders of the defined types were placed in this encounter.  No orders of the defined types were placed in this encounter.     Procedures: No procedures performed   Clinical Data: No additional findings.   Subjective: Chief Complaint  Patient presents with  . Left Hip - Pain  The patient is a very pleasant 57 year old female referred from Dr. Otelia SergeantNitka to evaluate her for left hip pain and the possibility for needing a left total hip arthroplasty.  She has had multiple significant lumbar spine fusions and has a fusion over the sacrum.  She does have a family history of arthritis with her father having hip replacement in the last 5 years.  She has a leg length discrepancy she states with her left leg shorter than the right.  She walks significantly and over due to back and sacral issues.  She does have pain in her left groin area and is been  slowly getting worse.  Hurts with activities daily living.  Is become 10 out of 10 at times.  It is detrimentally affected her quality of life and her mobility.  It hurts mainly in the groin.  It does wake her up at night and hurts if she is been sitting for long period time and gets up to move.  She is a petite individual.  She is work on activity modification.  She is worked on hip strengthening exercises as well.  Is been worsening for a year now.  HPI  Review of Systems She currently denies any headache, chest pain, shortness of breath, fever, chills, nausea, vomiting  Objective: Vital Signs: There were no vitals taken for this visit.  Physical Exam She is alert and oriented x3 and in no acute distress Ortho Exam Examination does show that she walks with a limp and significantly so the left side.  When she stands it does appear that she has a ligament discrepancy with the left shorter than right when she lays down is not as obvious.  She has significant limitations with external rotation of both hips with good internal rotation.  Her left hip hurts significantly in the groin area. Specialty Comments:  No specialty comments available.  Imaging: No results  found. Plain films and CT scan reviewed show both hips show significant severe end-stage arthritis of left hip.  There is cystic changes in the acetabulum and femoral head.  There are para-articular osteophytes and sclerotic changes.  There is also significant joint space narrowing.  PMFS History: Patient Active Problem List   Diagnosis Date Noted  . Unilateral primary osteoarthritis, left hip 04/26/2018  . Epicondylitis elbow, medial 12/17/2011  . Lateral epicondylitis 01/09/2011  . BURSITIS, HIP 07/22/2010  . LEG PAIN, RIGHT 05/22/2010  . UNEQUAL LEG LENGTH 05/22/2010  . SCOLIOSIS, LUMBAR SPINE 05/22/2010  . ABNORMALITY OF GAIT 05/22/2010   Past Medical History:  Diagnosis Date  . Asthma   . Kidney disease, chronic, stage II  (GFR 60-89 ml/min)     History reviewed. No pertinent family history.  Past Surgical History:  Procedure Laterality Date  . BLADDER AUGMENTATION    . HERNIA REPAIR    . KNEE SURGERY    . SPINAL FUSION     Social History   Occupational History  . Not on file  Tobacco Use  . Smoking status: Never Smoker  . Smokeless tobacco: Never Used  Substance and Sexual Activity  . Alcohol use: No  . Drug use: No  . Sexual activity: Yes

## 2018-05-20 DIAGNOSIS — F411 Generalized anxiety disorder: Secondary | ICD-10-CM | POA: Diagnosis not present

## 2018-05-20 DIAGNOSIS — F324 Major depressive disorder, single episode, in partial remission: Secondary | ICD-10-CM | POA: Diagnosis not present

## 2018-05-20 DIAGNOSIS — E785 Hyperlipidemia, unspecified: Secondary | ICD-10-CM | POA: Diagnosis not present

## 2018-05-20 DIAGNOSIS — Z23 Encounter for immunization: Secondary | ICD-10-CM | POA: Diagnosis not present

## 2018-05-23 DIAGNOSIS — M961 Postlaminectomy syndrome, not elsewhere classified: Secondary | ICD-10-CM | POA: Diagnosis not present

## 2018-05-23 DIAGNOSIS — M47812 Spondylosis without myelopathy or radiculopathy, cervical region: Secondary | ICD-10-CM | POA: Diagnosis not present

## 2018-05-23 DIAGNOSIS — M25552 Pain in left hip: Secondary | ICD-10-CM | POA: Diagnosis not present

## 2018-05-23 DIAGNOSIS — M5481 Occipital neuralgia: Secondary | ICD-10-CM | POA: Diagnosis not present

## 2018-06-03 ENCOUNTER — Other Ambulatory Visit (INDEPENDENT_AMBULATORY_CARE_PROVIDER_SITE_OTHER): Payer: Self-pay | Admitting: Physician Assistant

## 2018-06-06 NOTE — Pre-Procedure Instructions (Signed)
Destinae Page-Shinn  06/06/2018      [id number] [place] [place] - [address] AT [facility] [address] [place] [address] Phone: [phone] Fax: [phone]    Your procedure is scheduled on Tuesday October 8th.  Report to [facility] Admitting at 1100 A.M.  Call this number if you have problems the morning of surgery:  [phone]   Remember:  Do not eat or drink after midnight.     Take these medicines the morning of surgery with A SIP OF WATER   Albuterol (if needed) Bring inhaler with you  Wellbutrin  Flexeril (if needed)  Synthroid  Ativan (if needed)  Crestor  Tapentadol  7 days prior to surgery STOP taking any Aspirin(unless otherwise instructed by your surgeon), Aleve, Naproxen, Ibuprofen, Motrin, Advil, Goody's, BC's, all herbal medications, fish oil, and all vitamins     Do not wear jewelry, make-up or nail polish.  Do not wear lotions, powders, or perfumes, or deodorant.  Do not shave 48 hours prior to surgery.  Men may shave face and neck.  Do not bring valuables to the hospital.  [facility] is not responsible for any belongings or valuables.  Contacts, dentures or bridgework may not be worn into surgery.  Leave your suitcase in the car.  After surgery it may be brought to your room.  For patients admitted to the hospital, discharge time will be determined by your treatment team.  Patients discharged the day of surgery will not be allowed to drive home.    [facility]- Preparing For Surgery  Before surgery, you can play an important role. Because skin is not sterile, your skin needs to be as free of germs as possible. You can reduce the number of germs on your skin by washing with CHG (chlorahexidine gluconate) Soap before surgery.  CHG is an antiseptic cleaner which kills germs and bonds with the skin to continue killing germs even after washing.    Oral Hygiene is also important to  reduce your risk of infection.  Remember - BRUSH YOUR TEETH THE MORNING OF SURGERY WITH YOUR REGULAR TOOTHPASTE  Please do not use if you have an allergy to CHG or antibacterial soaps. If your skin becomes reddened/irritated stop using the CHG.  Do not shave (including legs and underarms) for at least 48 hours prior to first CHG shower. It is OK to shave your face.  Please follow these instructions carefully.   1. Shower the NIGHT BEFORE SURGERY and the MORNING OF SURGERY with CHG.   2. If you chose to wash your hair, wash your hair first as usual with your normal shampoo.  3. After you shampoo, rinse your hair and body thoroughly to remove the shampoo.  4. Use CHG as you would any other liquid soap. You can apply CHG directly to the skin and wash gently with a scrungie or a clean washcloth.   5. Apply the CHG Soap to your body ONLY FROM THE NECK DOWN.  Do not use on open wounds or open sores. Avoid contact with your eyes, ears, mouth and genitals (private parts). Wash Face and genitals (private parts)  with your normal soap.  6. Wash thoroughly, paying special attention to the area where your surgery will be performed.  7. Thoroughly rinse your body with warm water from the neck down.  8. DO NOT shower/wash with your normal soap after using and rinsing off the CHG Soap.  9. Pat yourself dry with a CLEAN TOWEL.  10. Wear CLEAN PAJAMAS to bed the night before surgery, wear comfortable clothes the morning of surgery  11. Place CLEAN SHEETS on your bed the night of your first shower and DO NOT SLEEP WITH PETS.    Day of Surgery:  Do not apply any deodorants/lotions.  Please wear clean clothes to the hospital/surgery center.   Remember to brush your teeth WITH YOUR REGULAR TOOTHPASTE.    Please read over the following fact sheets that you were given.

## 2018-06-07 ENCOUNTER — Encounter (HOSPITAL_COMMUNITY): Payer: Self-pay

## 2018-06-07 ENCOUNTER — Encounter (HOSPITAL_COMMUNITY)
Admission: RE | Admit: 2018-06-07 | Discharge: 2018-06-07 | Disposition: A | Discharge status: Home / Self Care | Payer: BLUE CROSS/BLUE SHIELD | Source: Ambulatory Visit | Attending: Orthopaedic Surgery | Admitting: Orthopaedic Surgery

## 2018-06-07 ENCOUNTER — Other Ambulatory Visit: Payer: Self-pay

## 2018-06-07 DIAGNOSIS — Z01818 Encounter for other preprocedural examination: Secondary | ICD-10-CM | POA: Diagnosis not present

## 2018-06-07 DIAGNOSIS — M1612 Unilateral primary osteoarthritis, left hip: Secondary | ICD-10-CM | POA: Insufficient documentation

## 2018-06-07 HISTORY — DX: Cardiac arrhythmia, unspecified: I49.9

## 2018-06-07 HISTORY — DX: Hypothyroidism, unspecified: E03.9

## 2018-06-07 HISTORY — DX: Myoneural disorder, unspecified: G70.9

## 2018-06-07 LAB — BASIC METABOLIC PANEL
Anion gap: 7 (ref 5–15)
BUN: 13 mg/dL (ref 6–20)
CO2: 25 mmol/L (ref 22–32)
Calcium: 9 mg/dL (ref 8.9–10.3)
Chloride: 107 mmol/L (ref 98–111)
Creatinine, Ser: 1.17 mg/dL — ABNORMAL HIGH (ref 0.44–1.00)
GFR calc Af Amer: 59 mL/min — ABNORMAL LOW (ref >60)
GFR calc non Af Amer: 51 mL/min — ABNORMAL LOW (ref >60)
Glucose, Bld: 87 mg/dL (ref 70–99)
Potassium: 3.9 mmol/L (ref 3.5–5.1)
Sodium: 139 mmol/L (ref 135–145)

## 2018-06-07 LAB — CBC
HCT: 37.2 % (ref 36.0–46.0)
Hemoglobin: 12.2 g/dL (ref 12.0–15.0)
MCH: 30.8 pg (ref 26.0–34.0)
MCHC: 32.8 g/dL (ref 30.0–36.0)
MCV: 93.9 fL (ref 78.0–100.0)
Platelets: 206 10*3/uL (ref 150–400)
RBC: 3.96 MIL/uL (ref 3.87–5.11)
RDW: 13 % (ref 11.5–15.5)
WBC: 5.3 10*3/uL (ref 4.0–10.5)

## 2018-06-07 LAB — SURGICAL PCR SCREEN
MRSA, PCR: NEGATIVE
Staphylococcus aureus: POSITIVE — AB

## 2018-06-07 NOTE — Progress Notes (Signed)
PCP - Laurann Montana MD. Cardiologist -    EKG - 06/07/18  Blood Thinner Instructions: N/A Aspirin Instructions: N/A   Anesthesia review: None  Patient denies shortness of breath, fever, cough and chest pain at PAT appointment   Patient verbalized understanding of instructions that were given to them at the PAT appointment. Patient was also instructed that they will need to review over the PAT instructions again at home before surgery.

## 2018-06-08 NOTE — Progress Notes (Signed)
[facility] [address] [facility] AM today for positive PCR of Staph. Pt notified and voiced understanding.

## 2018-06-13 ENCOUNTER — Other Ambulatory Visit (INDEPENDENT_AMBULATORY_CARE_PROVIDER_SITE_OTHER): Payer: Self-pay

## 2018-06-13 MED ORDER — TRANEXAMIC ACID 1000 MG/10ML IV SOLN
1000.0000 mg | INTRAVENOUS | Status: AC
  Administered 2018-06-14: 1000 mg via INTRAVENOUS
  Filled 2018-06-13: qty 1000

## 2018-06-14 ENCOUNTER — Encounter (HOSPITAL_COMMUNITY)
Admission: RE | Disposition: A | Discharge status: Home Health | Payer: Self-pay | Source: Ambulatory Visit | Attending: Orthopaedic Surgery

## 2018-06-14 ENCOUNTER — Inpatient Hospital Stay (HOSPITAL_COMMUNITY): Payer: BLUE CROSS/BLUE SHIELD | Admitting: Anesthesiology

## 2018-06-14 ENCOUNTER — Other Ambulatory Visit: Payer: Self-pay

## 2018-06-14 ENCOUNTER — Inpatient Hospital Stay (HOSPITAL_COMMUNITY)
Admission: RE | Admit: 2018-06-14 | Discharge: 2018-06-19 | DRG: 470 | Disposition: A | Discharge status: Home Health | Payer: BLUE CROSS/BLUE SHIELD | Source: Ambulatory Visit | Attending: Orthopaedic Surgery | Admitting: Orthopaedic Surgery

## 2018-06-14 ENCOUNTER — Inpatient Hospital Stay (HOSPITAL_COMMUNITY): Payer: BLUE CROSS/BLUE SHIELD

## 2018-06-14 ENCOUNTER — Encounter (HOSPITAL_COMMUNITY): Payer: Self-pay | Admitting: Anesthesiology

## 2018-06-14 DIAGNOSIS — G629 Polyneuropathy, unspecified: Secondary | ICD-10-CM | POA: Diagnosis not present

## 2018-06-14 DIAGNOSIS — Z9181 History of falling: Secondary | ICD-10-CM | POA: Diagnosis not present

## 2018-06-14 DIAGNOSIS — D62 Acute posthemorrhagic anemia: Secondary | ICD-10-CM | POA: Diagnosis not present

## 2018-06-14 DIAGNOSIS — N182 Chronic kidney disease, stage 2 (mild): Secondary | ICD-10-CM | POA: Diagnosis present

## 2018-06-14 DIAGNOSIS — E039 Hypothyroidism, unspecified: Secondary | ICD-10-CM | POA: Diagnosis present

## 2018-06-14 DIAGNOSIS — G8929 Other chronic pain: Secondary | ICD-10-CM | POA: Diagnosis present

## 2018-06-14 DIAGNOSIS — Z91018 Allergy to other foods: Secondary | ICD-10-CM | POA: Diagnosis not present

## 2018-06-14 DIAGNOSIS — Z981 Arthrodesis status: Secondary | ICD-10-CM

## 2018-06-14 DIAGNOSIS — M1612 Unilateral primary osteoarthritis, left hip: Secondary | ICD-10-CM | POA: Diagnosis not present

## 2018-06-14 DIAGNOSIS — Z471 Aftercare following joint replacement surgery: Secondary | ICD-10-CM | POA: Diagnosis not present

## 2018-06-14 DIAGNOSIS — Z96642 Presence of left artificial hip joint: Secondary | ICD-10-CM

## 2018-06-14 DIAGNOSIS — Z419 Encounter for procedure for purposes other than remedying health state, unspecified: Secondary | ICD-10-CM

## 2018-06-14 DIAGNOSIS — J45909 Unspecified asthma, uncomplicated: Secondary | ICD-10-CM | POA: Diagnosis not present

## 2018-06-14 DIAGNOSIS — Z79899 Other long term (current) drug therapy: Secondary | ICD-10-CM | POA: Diagnosis not present

## 2018-06-14 DIAGNOSIS — M545 Low back pain: Secondary | ICD-10-CM | POA: Diagnosis not present

## 2018-06-14 DIAGNOSIS — Z885 Allergy status to narcotic agent status: Secondary | ICD-10-CM

## 2018-06-14 HISTORY — DX: Other specified postprocedural states: Z98.890

## 2018-06-14 HISTORY — DX: Low back pain, unspecified: M54.50

## 2018-06-14 HISTORY — DX: Other specified postprocedural states: R11.2

## 2018-06-14 HISTORY — DX: Other chronic pain: G89.29

## 2018-06-14 HISTORY — DX: Migraine, unspecified, not intractable, without status migrainosus: G43.909

## 2018-06-14 HISTORY — PX: TOTAL HIP ARTHROPLASTY: SHX124

## 2018-06-14 HISTORY — DX: Anxiety disorder, unspecified: F41.9

## 2018-06-14 HISTORY — DX: Low back pain: M54.5

## 2018-06-14 HISTORY — DX: Personal history of other medical treatment: Z92.89

## 2018-06-14 HISTORY — DX: Pure hypercholesterolemia, unspecified: E78.00

## 2018-06-14 HISTORY — DX: Scoliosis, unspecified: M41.9

## 2018-06-14 SURGERY — ARTHROPLASTY, HIP, TOTAL, ANTERIOR APPROACH
Anesthesia: General | Site: Hip | Laterality: Left

## 2018-06-14 MED ORDER — PROMETHAZINE HCL 25 MG/ML IJ SOLN: 6.2500 mg | INTRAMUSCULAR | Status: DC | PRN

## 2018-06-14 MED ORDER — GABAPENTIN 300 MG PO CAPS
300.0000 mg | ORAL_CAPSULE | Freq: Every day | ORAL | Status: DC
  Administered 2018-06-14 – 2018-06-18 (×5): 300 mg via ORAL
  Filled 2018-06-14 (×5): qty 1

## 2018-06-14 MED ORDER — LORAZEPAM 0.5 MG PO TABS: 0.2500 mg | ORAL_TABLET | Freq: Three times a day (TID) | ORAL | Status: DC | PRN

## 2018-06-14 MED ORDER — CEFAZOLIN SODIUM-DEXTROSE 1-4 GM/50ML-% IV SOLN
1.0000 g | Freq: Four times a day (QID) | INTRAVENOUS | Status: AC
  Administered 2018-06-14 (×2): 1 g via INTRAVENOUS
  Filled 2018-06-14 (×2): qty 50

## 2018-06-14 MED ORDER — PHENOL 1.4 % MT LIQD: 1.0000 | OROMUCOSAL | Status: DC | PRN

## 2018-06-14 MED ORDER — ALBUTEROL SULFATE (2.5 MG/3ML) 0.083% IN NEBU: 3.0000 mL | INHALATION_SOLUTION | Freq: Four times a day (QID) | RESPIRATORY_TRACT | Status: DC | PRN

## 2018-06-14 MED ORDER — SODIUM CHLORIDE 0.9 % IR SOLN
Status: DC | PRN
  Administered 2018-06-14: 3000 mL

## 2018-06-14 MED ORDER — OXYCODONE HCL 5 MG PO TABS
5.0000 mg | ORAL_TABLET | ORAL | Status: DC | PRN
  Administered 2018-06-14 – 2018-06-19 (×16): 10 mg via ORAL
  Filled 2018-06-14 (×14): qty 2

## 2018-06-14 MED ORDER — CHLORHEXIDINE GLUCONATE 4 % EX LIQD: 60.0000 mL | Freq: Once | CUTANEOUS | Status: DC

## 2018-06-14 MED ORDER — LIDOCAINE 2% (20 MG/ML) 5 ML SYRINGE
INTRAMUSCULAR | Status: AC
  Filled 2018-06-14: qty 10

## 2018-06-14 MED ORDER — ASPIRIN 81 MG PO CHEW
81.0000 mg | CHEWABLE_TABLET | Freq: Two times a day (BID) | ORAL | Status: DC
  Administered 2018-06-14 – 2018-06-19 (×10): 81 mg via ORAL
  Filled 2018-06-14 (×10): qty 1

## 2018-06-14 MED ORDER — FENTANYL CITRATE (PF) 100 MCG/2ML IJ SOLN
INTRAMUSCULAR | Status: DC | PRN
  Administered 2018-06-14 (×3): 50 ug via INTRAVENOUS
  Administered 2018-06-14: 25 ug via INTRAVENOUS
  Administered 2018-06-14: 50 ug via INTRAVENOUS
  Administered 2018-06-14: 25 ug via INTRAVENOUS

## 2018-06-14 MED ORDER — MENTHOL 3 MG MT LOZG: 1.0000 | LOZENGE | OROMUCOSAL | Status: DC | PRN

## 2018-06-14 MED ORDER — HYDROMORPHONE HCL 1 MG/ML IJ SOLN
0.5000 mg | INTRAMUSCULAR | Status: DC | PRN
  Administered 2018-06-14: 1 mg via INTRAVENOUS

## 2018-06-14 MED ORDER — DOCUSATE SODIUM 100 MG PO CAPS
100.0000 mg | ORAL_CAPSULE | Freq: Two times a day (BID) | ORAL | Status: DC
  Administered 2018-06-14 – 2018-06-19 (×10): 100 mg via ORAL
  Filled 2018-06-14 (×10): qty 1

## 2018-06-14 MED ORDER — ALUM & MAG HYDROXIDE-SIMETH 200-200-20 MG/5ML PO SUSP: 30.0000 mL | ORAL | Status: DC | PRN

## 2018-06-14 MED ORDER — MIDAZOLAM HCL 2 MG/2ML IJ SOLN
INTRAMUSCULAR | Status: AC
  Filled 2018-06-14: qty 2

## 2018-06-14 MED ORDER — 0.9 % SODIUM CHLORIDE (POUR BTL) OPTIME
TOPICAL | Status: DC | PRN
  Administered 2018-06-14: 1000 mL

## 2018-06-14 MED ORDER — METHOCARBAMOL 500 MG PO TABS: 500.0000 mg | ORAL_TABLET | Freq: Four times a day (QID) | ORAL | Status: DC | PRN

## 2018-06-14 MED ORDER — ONDANSETRON HCL 4 MG/2ML IJ SOLN
4.0000 mg | Freq: Four times a day (QID) | INTRAMUSCULAR | Status: DC | PRN
  Administered 2018-06-15: 4 mg via INTRAVENOUS
  Filled 2018-06-14: qty 2

## 2018-06-14 MED ORDER — HYDROMORPHONE HCL 1 MG/ML IJ SOLN
INTRAMUSCULAR | Status: AC
  Filled 2018-06-14: qty 1

## 2018-06-14 MED ORDER — FENTANYL CITRATE (PF) 250 MCG/5ML IJ SOLN
INTRAMUSCULAR | Status: AC
  Filled 2018-06-14: qty 5

## 2018-06-14 MED ORDER — ONDANSETRON HCL 4 MG/2ML IJ SOLN
INTRAMUSCULAR | Status: DC | PRN
  Administered 2018-06-14: 4 mg via INTRAVENOUS

## 2018-06-14 MED ORDER — CALCIUM CARBONATE-VITAMIN D 500-200 MG-UNIT PO TABS
1.0000 | ORAL_TABLET | Freq: Every day | ORAL | Status: DC
  Administered 2018-06-15 – 2018-06-19 (×5): 1 via ORAL
  Filled 2018-06-14 (×5): qty 1

## 2018-06-14 MED ORDER — POLYETHYLENE GLYCOL 3350 17 G PO PACK: 17.0000 g | PACK | Freq: Every day | ORAL | Status: DC | PRN

## 2018-06-14 MED ORDER — SODIUM CHLORIDE 0.9 % IV SOLN
INTRAVENOUS | Status: DC
  Administered 2018-06-14 – 2018-06-16 (×2): via INTRAVENOUS

## 2018-06-14 MED ORDER — LACTATED RINGERS IV SOLN
INTRAVENOUS | Status: DC | PRN
  Administered 2018-06-14 (×2): via INTRAVENOUS

## 2018-06-14 MED ORDER — ONDANSETRON HCL 4 MG/2ML IJ SOLN
INTRAMUSCULAR | Status: AC
  Filled 2018-06-14: qty 2

## 2018-06-14 MED ORDER — PHENYLEPHRINE 40 MCG/ML (10ML) SYRINGE FOR IV PUSH (FOR BLOOD PRESSURE SUPPORT)
PREFILLED_SYRINGE | INTRAVENOUS | Status: AC
  Filled 2018-06-14: qty 20

## 2018-06-14 MED ORDER — SERTRALINE HCL 100 MG PO TABS
100.0000 mg | ORAL_TABLET | Freq: Every evening | ORAL | Status: DC
  Administered 2018-06-14 – 2018-06-18 (×5): 100 mg via ORAL
  Filled 2018-06-14 (×5): qty 1

## 2018-06-14 MED ORDER — PHENYLEPHRINE HCL 10 MG/ML IJ SOLN
INTRAMUSCULAR | Status: DC | PRN
  Administered 2018-06-14 (×5): 80 ug via INTRAVENOUS

## 2018-06-14 MED ORDER — HYDROMORPHONE HCL 1 MG/ML IJ SOLN
0.2500 mg | INTRAMUSCULAR | Status: DC | PRN
  Administered 2018-06-14 (×2): 0.25 mg via INTRAVENOUS

## 2018-06-14 MED ORDER — ROSUVASTATIN CALCIUM 5 MG PO TABS
5.0000 mg | ORAL_TABLET | Freq: Every day | ORAL | Status: DC
  Administered 2018-06-14 – 2018-06-18 (×5): 5 mg via ORAL
  Filled 2018-06-14 (×5): qty 1

## 2018-06-14 MED ORDER — TAPENTADOL HCL ER 50 MG PO TB12
50.0000 mg | ORAL_TABLET | Freq: Every day | ORAL | Status: DC
  Filled 2018-06-14: qty 1

## 2018-06-14 MED ORDER — MIDAZOLAM HCL 5 MG/5ML IJ SOLN
INTRAMUSCULAR | Status: DC | PRN
  Administered 2018-06-14: 2 mg via INTRAVENOUS

## 2018-06-14 MED ORDER — SODIUM CHLORIDE 0.9 % IV SOLN
INTRAVENOUS | Status: DC | PRN
  Administered 2018-06-14: 60 ug/min via INTRAVENOUS

## 2018-06-14 MED ORDER — LACTATED RINGERS IV SOLN
INTRAVENOUS | Status: DC
  Administered 2018-06-14 (×2): via INTRAVENOUS

## 2018-06-14 MED ORDER — EPHEDRINE 5 MG/ML INJ
INTRAVENOUS | Status: AC
  Filled 2018-06-14: qty 20

## 2018-06-14 MED ORDER — LIDOCAINE HCL (CARDIAC) PF 100 MG/5ML IV SOSY
PREFILLED_SYRINGE | INTRAVENOUS | Status: DC | PRN
  Administered 2018-06-14: 60 mg via INTRAVENOUS

## 2018-06-14 MED ORDER — DEXAMETHASONE SODIUM PHOSPHATE 10 MG/ML IJ SOLN
INTRAMUSCULAR | Status: DC | PRN
  Administered 2018-06-14: 10 mg via INTRAVENOUS

## 2018-06-14 MED ORDER — BUPROPION HCL ER (XL) 300 MG PO TB24
300.0000 mg | ORAL_TABLET | Freq: Every day | ORAL | Status: DC
  Administered 2018-06-14 – 2018-06-19 (×6): 300 mg via ORAL
  Filled 2018-06-14 (×6): qty 1

## 2018-06-14 MED ORDER — CEFAZOLIN SODIUM-DEXTROSE 2-4 GM/100ML-% IV SOLN
2.0000 g | INTRAVENOUS | Status: AC
  Administered 2018-06-14: 2 g via INTRAVENOUS

## 2018-06-14 MED ORDER — DIPHENHYDRAMINE HCL 12.5 MG/5ML PO ELIX: 12.5000 mg | ORAL_SOLUTION | ORAL | Status: DC | PRN

## 2018-06-14 MED ORDER — PROPOFOL 10 MG/ML IV BOLUS
INTRAVENOUS | Status: DC | PRN
  Administered 2018-06-14: 200 mg via INTRAVENOUS

## 2018-06-14 MED ORDER — METOCLOPRAMIDE HCL 5 MG/ML IJ SOLN
5.0000 mg | Freq: Three times a day (TID) | INTRAMUSCULAR | Status: DC | PRN
  Administered 2018-06-14: 10 mg via INTRAVENOUS
  Filled 2018-06-14: qty 2

## 2018-06-14 MED ORDER — METHOCARBAMOL 1000 MG/10ML IJ SOLN
500.0000 mg | Freq: Four times a day (QID) | INTRAVENOUS | Status: DC | PRN
  Filled 2018-06-14: qty 5

## 2018-06-14 MED ORDER — ONDANSETRON HCL 4 MG/2ML IJ SOLN
INTRAMUSCULAR | Status: AC
  Administered 2018-06-14: 4 mg
  Filled 2018-06-14: qty 2

## 2018-06-14 MED ORDER — ACETAMINOPHEN 325 MG PO TABS
325.0000 mg | ORAL_TABLET | Freq: Four times a day (QID) | ORAL | Status: DC | PRN
  Administered 2018-06-15 – 2018-06-16 (×2): 650 mg via ORAL
  Filled 2018-06-14 (×2): qty 2

## 2018-06-14 MED ORDER — EPHEDRINE SULFATE 50 MG/ML IJ SOLN
INTRAMUSCULAR | Status: DC | PRN
  Administered 2018-06-14 (×5): 5 mg via INTRAVENOUS

## 2018-06-14 MED ORDER — DEXAMETHASONE SODIUM PHOSPHATE 10 MG/ML IJ SOLN
INTRAMUSCULAR | Status: AC
  Filled 2018-06-14: qty 1

## 2018-06-14 MED ORDER — PANTOPRAZOLE SODIUM 40 MG PO TBEC
40.0000 mg | DELAYED_RELEASE_TABLET | Freq: Every day | ORAL | Status: DC
  Administered 2018-06-14 – 2018-06-18 (×5): 40 mg via ORAL
  Filled 2018-06-14 (×5): qty 1

## 2018-06-14 MED ORDER — CEFAZOLIN SODIUM-DEXTROSE 2-4 GM/100ML-% IV SOLN
INTRAVENOUS | Status: AC
  Filled 2018-06-14: qty 100

## 2018-06-14 MED ORDER — ONDANSETRON HCL 4 MG PO TABS
4.0000 mg | ORAL_TABLET | Freq: Four times a day (QID) | ORAL | Status: DC | PRN
  Administered 2018-06-16: 4 mg via ORAL

## 2018-06-14 MED ORDER — LEVOTHYROXINE SODIUM 50 MCG PO TABS
50.0000 ug | ORAL_TABLET | Freq: Every day | ORAL | Status: DC
  Administered 2018-06-15 – 2018-06-19 (×5): 50 ug via ORAL
  Filled 2018-06-14 (×5): qty 1

## 2018-06-14 MED ORDER — OXYCODONE HCL 5 MG PO TABS
10.0000 mg | ORAL_TABLET | ORAL | Status: DC | PRN
  Filled 2018-06-14 (×2): qty 2

## 2018-06-14 MED ORDER — METOCLOPRAMIDE HCL 5 MG PO TABS: 5.0000 mg | ORAL_TABLET | Freq: Three times a day (TID) | ORAL | Status: DC | PRN

## 2018-06-14 SURGICAL SUPPLY — 52 items
APL SKNCLS STERI-STRIP NONHPOA (GAUZE/BANDAGES/DRESSINGS) ×1
BENZOIN TINCTURE PRP APPL 2/3 (GAUZE/BANDAGES/DRESSINGS) ×2 IMPLANT
BLADE CLIPPER SURG (BLADE) IMPLANT
BLADE SAW SGTL 18X1.27X75 (BLADE) ×2 IMPLANT
COVER SURGICAL LIGHT HANDLE (MISCELLANEOUS) ×2 IMPLANT
COVER WAND RF STERILE (DRAPES) ×2 IMPLANT
DRAPE C-ARM 42X72 X-RAY (DRAPES) ×2 IMPLANT
DRAPE STERI IOBAN 125X83 (DRAPES) ×2 IMPLANT
DRAPE U-SHAPE 47X51 STRL (DRAPES) ×6 IMPLANT
DRSG AQUACEL AG ADV 3.5X10 (GAUZE/BANDAGES/DRESSINGS) ×2 IMPLANT
DURAPREP 26ML APPLICATOR (WOUND CARE) ×2 IMPLANT
ELECT BLADE 4.0 EZ CLEAN MEGAD (MISCELLANEOUS) ×2
ELECT BLADE 6.5 EXT (BLADE) IMPLANT
ELECT REM PT RETURN 9FT ADLT (ELECTROSURGICAL) ×2
ELECTRODE BLDE 4.0 EZ CLN MEGD (MISCELLANEOUS) ×1 IMPLANT
ELECTRODE REM PT RTRN 9FT ADLT (ELECTROSURGICAL) ×1 IMPLANT
FACESHIELD WRAPAROUND (MASK) ×4 IMPLANT
GLOVE BIOGEL PI IND STRL 8 (GLOVE) ×2 IMPLANT
GLOVE BIOGEL PI INDICATOR 8 (GLOVE) ×2
GLOVE ECLIPSE 8.0 STRL XLNG CF (GLOVE) ×4 IMPLANT
GLOVE ORTHO TXT STRL SZ7.5 (GLOVE) ×4 IMPLANT
GOWN STRL REUS W/ TWL LRG LVL3 (GOWN DISPOSABLE) ×1 IMPLANT
GOWN STRL REUS W/ TWL XL LVL3 (GOWN DISPOSABLE) ×3 IMPLANT
GOWN STRL REUS W/TWL LRG LVL3 (GOWN DISPOSABLE) ×2
GOWN STRL REUS W/TWL XL LVL3 (GOWN DISPOSABLE) ×6
HANDPIECE INTERPULSE COAX TIP (DISPOSABLE) ×1
HEAD CERAMIC DELTA 36 PLUS 1.5 (Hips) ×2 IMPLANT
KIT BASIN OR (CUSTOM PROCEDURE TRAY) ×2 IMPLANT
KIT TURNOVER KIT B (KITS) ×2 IMPLANT
LINER NEUTRAL 52X36MM PLUS 4 (Liner) ×2 IMPLANT
MANIFOLD NEPTUNE II (INSTRUMENTS) ×2 IMPLANT
NS IRRIG 1000ML POUR BTL (IV SOLUTION) ×2 IMPLANT
PACK TOTAL JOINT (CUSTOM PROCEDURE TRAY) ×2 IMPLANT
PAD ARMBOARD 7.5X6 YLW CONV (MISCELLANEOUS) ×4 IMPLANT
PIN SECTOR W/GRIP ACE CUP 52MM (Hips) ×2 IMPLANT
SCREW PINN CAN 6.5X20 (Screw) ×2 IMPLANT
SET HNDPC FAN SPRY TIP SCT (DISPOSABLE) ×1 IMPLANT
STAPLER VISISTAT 35W (STAPLE) IMPLANT
STEM CORAIL KLA12 (Stem) ×2 IMPLANT
STRIP CLOSURE SKIN 1/2X4 (GAUZE/BANDAGES/DRESSINGS) ×2 IMPLANT
SUT ETHIBOND NAB CT1 #1 30IN (SUTURE) ×2 IMPLANT
SUT MNCRL AB 4-0 PS2 18 (SUTURE) IMPLANT
SUT VIC AB 0 CT1 27 (SUTURE) ×2
SUT VIC AB 0 CT1 27XBRD ANBCTR (SUTURE) ×1 IMPLANT
SUT VIC AB 1 CT1 27 (SUTURE) ×4
SUT VIC AB 1 CT1 27XBRD ANBCTR (SUTURE) ×2 IMPLANT
SUT VIC AB 2-0 CT1 27 (SUTURE) ×2
SUT VIC AB 2-0 CT1 TAPERPNT 27 (SUTURE) ×1 IMPLANT
TOWEL OR 17X24 6PK STRL BLUE (TOWEL DISPOSABLE) ×2 IMPLANT
TOWEL OR 17X26 10 PK STRL BLUE (TOWEL DISPOSABLE) ×2 IMPLANT
TRAY CATH 16FR W/PLASTIC CATH (SET/KITS/TRAYS/PACK) IMPLANT
TRAY FOLEY MTR SLVR 16FR STAT (SET/KITS/TRAYS/PACK) ×2 IMPLANT

## 2018-06-14 NOTE — Anesthesia Procedure Notes (Signed)
Procedure Name: LMA Insertion Date/Time: 06/14/2018 12:23 PM Performed by: Lanell Matar, CRNA Pre-anesthesia Checklist: Patient identified, Emergency Drugs available, Suction available and Patient being monitored Patient Re-evaluated:Patient Re-evaluated prior to induction Oxygen Delivery Method: Circle System Utilized Preoxygenation: Pre-oxygenation with 100% oxygen Induction Type: IV induction Ventilation: Mask ventilation without difficulty LMA: LMA inserted LMA Size: 4.0 Number of attempts: 1 Placement Confirmation: positive ETCO2 Tube secured with: Tape Dental Injury: Teeth and Oropharynx as per pre-operative assessment

## 2018-06-14 NOTE — Transfer of Care (Signed)
Immediate Anesthesia Transfer of Care Note  Patient: Nicole Morris  Procedure(s) Performed: LEFT TOTAL HIP ARTHROPLASTY ANTERIOR APPROACH (Left Hip)  Patient Location: PACU  Anesthesia Type:General  Level of Consciousness: awake, oriented and sedated  Airway & Oxygen Therapy: Patient Spontanous Breathing and Patient connected to nasal cannula oxygen  Post-op Assessment: Report given to RN, Post -op Vital signs reviewed and stable and Patient moving all extremities X 4  Post vital signs: Reviewed and stable  Last Vitals:  Vitals Value Taken Time  BP 113/87 06/14/2018  2:12 PM  Temp    Pulse 78 06/14/2018  2:15 PM  Resp 12 06/14/2018  2:15 PM  SpO2 99 % 06/14/2018  2:15 PM  Vitals shown include unvalidated device data.  Last Pain:  Vitals:   06/14/18 1042  TempSrc:   PainSc: 3       Patients Stated Pain Goal: 3 (06/14/18 1042)  Complications: No apparent anesthesia complications

## 2018-06-14 NOTE — Progress Notes (Signed)
PT Cancellation Note  Patient Details Name: Atiana Levier MRN: [id number] DOB: 1960-11-04   Cancelled Treatment:    Reason Eval/Treat Not Completed: Other (comment) Pt very drowsy after receiving pain medications. Arouses but falls asleep very quickly. Will hold until pt with increased alertness and follow up as schedule allows.   Gladys Damme, PT, DPT  Acute Rehabilitation Services  Pager: [phone] Office: [phone]    Lehman Prom 06/14/2018, 5:30 PM

## 2018-06-14 NOTE — Op Note (Signed)
NAMEAVALEY, COOP MEDICAL RECORD ZO:[id number] ACCOUNT [id number] DATE OF BIRTH:Jan 06, 1961 FACILITY: [facility] LOCATION: [facility] PHYSICIAN:Leota Maka Aretha Parrot, MD  OPERATIVE REPORT  DATE OF PROCEDURE:  06/14/2018  PREOPERATIVE DIAGNOSIS:  Primary osteoarthritis and degenerative joint disease, left hip.  POSTOPERATIVE DIAGNOSIS:  Primary osteoarthritis and degenerative joint disease, left hip.  PROCEDURE:  Left total hip arthroplasty through direct anterior approach.  IMPLANTS:  DePuy Sector Gription acetabular component size 52 with a single screw, size 36+4 neutral polyethylene liner, size 12 Corail femoral component with varus offset, size 36+1.5 ceramic hip ball.  SURGEON:  Vanita Panda. Magnus Ivan, MD  ASSISTANT:  Richardean Canal, PA-C  ANESTHESIA: 1.  Attempted spinal. 2.  General.  ESTIMATED BLOOD LOSS:  500 mL  ANTIBIOTICS:  Two grams IV Ancef.  COMPLICATIONS:  None.  INDICATIONS:  The patient is a very pleasant 57 year old female with debilitating arthritis involving her left hip.  She has significant lumbar degenerative disk disease as well as had a lumbar to sacral fusion.  Her left hip shows significant  osteoarthritis on her CT scan and plain films.  Her pain is daily and it is in the groin.  Her left hip pain has thrown off her balance.  It has detrimentally affected activities of daily living, quality of life and her mobility.  At this point, she does  wish to proceed with a total hip arthroplasty through direct anterior approach.  We had a long and thorough discussion about the surgery.  We talked about the risks and benefits including the risk of acute blood loss anemia, nerve or vessel injury,  fracture, infection, dislocation, DVT and implant failure.  She understands our goals are to decrease pain, improve mobility and overall improve quality of life.  DESCRIPTION OF PROCEDURE:  After informed consent was obtained and appropriate left hip was marked.  She  was brought to the operating room and sat up on a stretcher where attempt was made at spinal anesthesia.  It was unsuccessful.  She was laid in the  supine position.  General anesthesia was then obtained.  A Foley catheter was placed.  We assessed her leg lengths and found it to be a really close to equal to preoperative.  We then placed traction boots on both her feet and placed her supine on the  Hana fracture table, the perineal post in place and both legs in line skeletal traction device and no traction applied.  Her left operative hip was prepped and draped with DuraPrep and sterile drapes.  A time-out was called to identify correct patient,  correct left hip.  We then made an incision just inferior and posterior to the anterior superior iliac spine and carried this obliquely down the leg.  We dissected down tensor fascia lata muscle.  Tensor fascia was then divided longitudinally to proceed  with direct anterior approach to the hip.  We identified and cauterized circumflex vessels.  I then identified the hip capsule, opened the hip capsule in an L-type format, finding moderate joint effusion and significant periarticular osteophytes.  We  placed curved retractors around the medial and lateral femoral neck and made our femoral neck cut just proximal to the lesser trochanter with an oscillating saw and completed this with an osteotome.  We placed a corkscrew guide in the femoral head and  removed the femoral head in its entirety and found a wide section devoid of cartilage.  We then placed a bent Hohmann over the medial acetabular rim and removed remnants of the acetabular labrum  and other debris.  I then began reaming under direct  visualization from a size 43 reamer going in stepwise increments up to a size 51 with all reamers under direct visualization, the last reamer under direct fluoroscopy, so we could obtain our depth in reaming our inclination and anteversion.  I then  placed the real DePuy  Sector Gription acetabular component size 51 and a single screw.  I placed 36+4 neutral polyethylene liner for that size acetabular component.  Attention was then turned to the femur.  With the leg externally rotated to 120 degrees,  extended and adducted, we placed a Mueller retractor medially and a Hohmann retractor above the greater trochanter, released lateral joint capsule and used a box-cutting osteotome to enter femoral canal and a rongeur to lateralize then began broaching  from a size 8 broach using Corail broaching system going up to a size 12.  With a size 12 in place, we tried a varus offset femoral neck based off her anatomy and a 36+1.5 hip ball, reduced this in the acetabulum and it was tight and stable in range of  motion.  Under fluoroscopy it looked like we needed a little more offset, but I do not think that was the case based off her varus offset neck and her +4 liner.  I also felt that she was tight on reduction and tight on range of motion so I was hesitant  to go up with any more on her size.  We then dislocated the hip and removed the trial components.  I placed the real Corail femoral component with varus offset size 12 and the real 36+1.5 ceramic hip ball reducing the pelvis and again it felt stable.  We  then irrigated the soft tissue with normal saline solution using pulsatile lavage.  We closed the joint capsule with interrupted #1 Ethibond suture, followed by running #1 Vicryl and tensor fascia, 0 Vicryl in the deep tissue, 2-0 Vicryl subcutaneous  tissue, 4-0 Monocryl subcuticular stitch and Steri-Strips on the skin.  An Aquacel dressing was applied.  She was taken off the Hana table, awakened, extubated, and taken to recovery room in stable condition.  All final counts were correct.  There were  no complications noted.  Of note, Rexene Edison, PA-C, assisted in the entire case.  His assistance was crucial for facilitating all aspects of this case.  TN/NUANCE  D:06/14/2018  T:06/14/2018 JOB:[id number]

## 2018-06-14 NOTE — H&P (Signed)
TOTAL HIP ADMISSION H&P  Patient is admitted for left total hip arthroplasty.  Subjective:  Chief Complaint: left hip pain  HPI: Nicole Morris, 57 y.o. female, has a history of pain and functional disability in the left hip(s) due to arthritis and patient has failed non-surgical conservative treatments for greater than 12 weeks to include NSAID's and/or analgesics, corticosteriod injections, flexibility and strengthening excercises, use of assistive devices and activity modification.  Onset of symptoms was gradual starting 1 years ago with gradually worsening course since that time.The patient noted no past surgery on the left hip(s).  Patient currently rates pain in the left hip at 10 out of 10 with activity. Patient has night pain, worsening of pain with activity and weight bearing, trendelenberg gait, pain that interfers with activities of daily living and pain with passive range of motion. Patient has evidence of subchondral cysts, subchondral sclerosis, periarticular osteophytes and joint space narrowing by imaging studies. This condition presents safety issues increasing the risk of falls.  There is no current active infection.  Patient Active Problem List   Diagnosis Date Noted  . Unilateral primary osteoarthritis, left hip 04/26/2018  . Epicondylitis elbow, medial 12/17/2011  . Lateral epicondylitis 01/09/2011  . BURSITIS, HIP 07/22/2010  . LEG PAIN, RIGHT 05/22/2010  . UNEQUAL LEG LENGTH 05/22/2010  . SCOLIOSIS, LUMBAR SPINE 05/22/2010  . ABNORMALITY OF GAIT 05/22/2010   Past Medical History:  Diagnosis Date  . Asthma   . Dysrhythmia    pt got Tachy last time under anesthesia  . Hypothyroidism   . Kidney disease, chronic, stage II (GFR 60-89 ml/min)   . Neuromuscular disorder (HCC)    neuropathy in legs from back surgery    Past Surgical History:  Procedure Laterality Date  . BLADDER AUGMENTATION    . HERNIA REPAIR    . KNEE SURGERY     Right knee  . SPINAL FUSION       Current Facility-Administered Medications  Medication Dose Route Frequency Provider Last Rate Last Dose  . ceFAZolin (ANCEF) 2-4 GM/100ML-% IVPB           . ceFAZolin (ANCEF) IVPB 2g/100 mL premix  2 g Intravenous On Call to OR Kirtland Bouchard, PA-C      . chlorhexidine (HIBICLENS) 4 % liquid 4 application  60 mL Topical Once Richardean Canal W, PA-C      . lactated ringers infusion   Intravenous Continuous Ellender, Catheryn Bacon, MD 10 mL/hr at 06/14/18 1047    . tranexamic acid (CYKLOKAPRON) 1,000 mg in sodium chloride 0.9 % 100 mL IVPB  1,000 mg Intravenous To OR Kathryne Hitch, MD       Allergies  Allergen Reactions  . Codeine Nausea Only  . Mushroom Extract Complex Nausea Only    Nausea     Social History   Tobacco Use  . Smoking status: Never Smoker  . Smokeless tobacco: Never Used  Substance Use Topics  . Alcohol use: No    History reviewed. No pertinent family history.   Review of Systems  Musculoskeletal: Positive for back pain and joint pain.  All other systems reviewed and are negative.   Objective:  Physical Exam  Constitutional: She is oriented to person, place, and time. She appears well-developed and well-nourished.  HENT:  Head: Normocephalic and atraumatic.  Eyes: Pupils are equal, round, and reactive to light.  Neck: Normal range of motion.  Cardiovascular: Normal rate.  Respiratory: Effort normal.  GI: Soft.  Musculoskeletal:  Left hip: She exhibits decreased range of motion, decreased strength, tenderness and bony tenderness.  Neurological: She is alert and oriented to person, place, and time.  Skin: Skin is warm and dry.  Psychiatric: She has a normal mood and affect.    Vital signs in last 24 hours: Temp:  [98.7 F (37.1 C)] 98.7 F (37.1 C) (10/08 1015) Pulse Rate:  [93] 93 (10/08 1015) Resp:  [20] 20 (10/08 1015) BP: (151)/(84) 151/84 (10/08 1015) SpO2:  [98 %] 98 % (10/08 1015) Weight:  [68.9 kg] 68.9 kg (10/08  1015)  Labs:   Estimated body mass index is 27.36 kg/m as calculated from the following:   Height as of this encounter: 5' 2.5" (1.588 m).   Weight as of this encounter: 68.9 kg.   Imaging Review Plain radiographs demonstrate severe degenerative joint disease of the left hip(s). The bone quality appears to be good for age and reported activity level.    Preoperative templating of the joint replacement has been completed, documented, and submitted to the Operating Room personnel in order to optimize intra-operative equipment management.     Assessment/Plan:  End stage arthritis, left hip(s)  The patient history, physical examination, clinical judgement of the provider and imaging studies are consistent with end stage degenerative joint disease of the left hip(s) and total hip arthroplasty is deemed medically necessary. The treatment options including medical management, injection therapy, arthroscopy and arthroplasty were discussed at length. The risks and benefits of total hip arthroplasty were presented and reviewed. The risks due to aseptic loosening, infection, stiffness, dislocation/subluxation,  thromboembolic complications and other imponderables were discussed.  The patient acknowledged the explanation, agreed to proceed with the plan and consent was signed. Patient is being admitted for inpatient treatment for surgery, pain control, PT, OT, prophylactic antibiotics, VTE prophylaxis, progressive ambulation and ADL's and discharge planning.The patient is planning to be discharged home with home health services

## 2018-06-14 NOTE — Anesthesia Preprocedure Evaluation (Addendum)
Anesthesia Evaluation  Patient identified by MRN, date of birth, ID band Patient awake    Reviewed: Allergy & Precautions, NPO status , Patient's Chart, lab work & pertinent test results  Airway Mallampati: II  TM Distance: >3 FB Neck ROM: Full    Dental no notable dental hx.    Pulmonary asthma ,    Pulmonary exam normal breath sounds clear to auscultation       Cardiovascular negative cardio ROS Normal cardiovascular exam Rhythm:Regular Rate:Normal  ECG: NSR, rate 73   Neuro/Psych negative neurological ROS  negative psych ROS   GI/Hepatic negative GI ROS, Neg liver ROS,   Endo/Other  Hypothyroidism   Renal/GU negative Renal ROS     Musculoskeletal  (+) Arthritis , Lumbar spine surgery x 3 SCOLIOSIS, LUMBAR SPINE   Abdominal   Peds  Hematology HLD   Anesthesia Other Findings osteoarthritis left hip  Reproductive/Obstetrics                           Anesthesia Physical Anesthesia Plan  ASA: II  Anesthesia Plan: General   Post-op Pain Management:    Induction: Intravenous  PONV Risk Score and Plan: 3 and Midazolam, Dexamethasone, Ondansetron and Treatment may vary due to age or medical condition  Airway Management Planned: LMA  Additional Equipment:   Intra-op Plan:   Post-operative Plan: Extubation in OR  Informed Consent: I have reviewed the patients History and Physical, chart, labs and discussed the procedure including the risks, benefits and alternatives for the proposed anesthesia with the patient or authorized representative who has indicated his/her understanding and acceptance.   Dental advisory given  Plan Discussed with: CRNA  Anesthesia Plan Comments:         Anesthesia Quick Evaluation

## 2018-06-14 NOTE — Anesthesia Postprocedure Evaluation (Signed)
Anesthesia Post Note  Patient: Nicole Morris  Procedure(s) Performed: LEFT TOTAL HIP ARTHROPLASTY ANTERIOR APPROACH (Left Hip)     Patient location during evaluation: PACU Anesthesia Type: General Level of consciousness: awake and alert Pain management: pain level controlled Vital Signs Assessment: post-procedure vital signs reviewed and stable Respiratory status: spontaneous breathing, nonlabored ventilation, respiratory function stable and patient connected to nasal cannula oxygen Cardiovascular status: blood pressure returned to baseline and stable Postop Assessment: no apparent nausea or vomiting Anesthetic complications: no    Last Vitals:  Vitals:   06/14/18 1635 06/14/18 2021  BP: (!) 128/91 (!) 129/91  Pulse: 87 89  Resp: 14 18  Temp: (!) 36.3 C 36.4 C  SpO2: 100% 100%    Last Pain:  Vitals:   06/14/18 2021  TempSrc: Oral  PainSc:                  Catheryn Bacon Saintclair Schroader

## 2018-06-14 NOTE — Brief Op Note (Signed)
06/14/2018  1:59 PM  PATIENT:  Nicole Morris  57 y.o. female  PRE-OPERATIVE DIAGNOSIS:  osteoarthritis left hip  POST-OPERATIVE DIAGNOSIS:  osteoarthritis left hip  PROCEDURE:  Procedure(s): LEFT TOTAL HIP ARTHROPLASTY ANTERIOR APPROACH (Left)  SURGEON:  Surgeon(s) and Role:    Kathryne Hitch, MD - Primary  PHYSICIAN ASSISTANT: Rexene Edison, PA-C  ANESTHESIA:   spinal and general  EBL:  500 mL   COUNTS:  YES  DICTATION: .Other Dictation: Dictation Number [id number]  PLAN OF CARE: Admit to inpatient   PATIENT DISPOSITION:  PACU - hemodynamically stable.   Delay start of Pharmacological VTE agent (>24hrs) due to surgical blood loss or risk of bleeding: no

## 2018-06-15 ENCOUNTER — Encounter (HOSPITAL_COMMUNITY): Payer: Self-pay | Admitting: Orthopaedic Surgery

## 2018-06-15 LAB — BASIC METABOLIC PANEL
Anion gap: 5 (ref 5–15)
BUN: 12 mg/dL (ref 6–20)
CO2: 25 mmol/L (ref 22–32)
Calcium: 8.2 mg/dL — ABNORMAL LOW (ref 8.9–10.3)
Chloride: 108 mmol/L (ref 98–111)
Creatinine, Ser: 1.02 mg/dL — ABNORMAL HIGH (ref 0.44–1.00)
GFR calc Af Amer: >60 mL/min (ref >60)
GFR calc non Af Amer: 60 mL/min — ABNORMAL LOW (ref >60)
Glucose, Bld: 124 mg/dL — ABNORMAL HIGH (ref 70–99)
Potassium: 5 mmol/L (ref 3.5–5.1)
Sodium: 138 mmol/L (ref 135–145)

## 2018-06-15 LAB — CBC
HCT: 30.4 % — ABNORMAL LOW (ref 36.0–46.0)
Hemoglobin: 9.6 g/dL — ABNORMAL LOW (ref 12.0–15.0)
MCH: 29.8 pg (ref 26.0–34.0)
MCHC: 31.6 g/dL (ref 30.0–36.0)
MCV: 94.4 fL (ref 80.0–100.0)
Platelets: 177 10*3/uL (ref 150–400)
RBC: 3.22 MIL/uL — ABNORMAL LOW (ref 3.87–5.11)
RDW: 13.6 % (ref 11.5–15.5)
WBC: 6.6 10*3/uL (ref 4.0–10.5)
nRBC: 0 % (ref 0.0–0.2)

## 2018-06-15 NOTE — Progress Notes (Signed)
Subjective: 1 Day Post-Op Procedure(s) (LRB): LEFT TOTAL HIP ARTHROPLASTY ANTERIOR APPROACH (Left) Patient reports pain as moderate.    Objective: Vital signs in last 24 hours: Temp:  [97.4 F (36.3 C)-98.7 F (37.1 C)] 98.4 F (36.9 C) (10/09 0424) Pulse Rate:  [79-98] 98 (10/09 0424) Resp:  [10-22] 16 (10/09 0424) BP: (103-151)/(73-92) 103/73 (10/09 0424) SpO2:  [96 %-100 %] 100 % (10/09 0424) Weight:  [68.9 kg] 68.9 kg (10/08 1015)  Intake/Output from previous day: 10/08 0701 - 10/09 0700 In: 1500 [I.V.:1500] Out: 1620 [Urine:1120; Blood:500] Intake/Output this shift: No intake/output data recorded.  Recent Labs    06/15/18 0421  HGB 9.6*   Recent Labs    06/15/18 0421  WBC 6.6  RBC 3.22*  HCT 30.4*  PLT 177   Recent Labs    06/15/18 0421  NA 138  K 5.0  CL 108  CO2 25  BUN 12  CREATININE 1.02*  GLUCOSE 124*  CALCIUM 8.2*   No results for input(s): LABPT, INR in the last 72 hours.  Sensation intact distally Intact pulses distally Dorsiflexion/Plantar flexion intact Incision: scant drainage  Assessment/Plan: 1 Day Post-Op Procedure(s) (LRB): LEFT TOTAL HIP ARTHROPLASTY ANTERIOR APPROACH (Left) Up with therapy Discharge home with home health next 1-2 days.    Kathryne Hitch 06/15/2018, 7:23 AM

## 2018-06-15 NOTE — Plan of Care (Addendum)

## 2018-06-15 NOTE — Progress Notes (Signed)
Physical Therapy Treatment Patient Details Name: Nicole Morris MRN: [id number] DOB: 12/07/1960 Today's Date: 06/15/2018    History of Present Illness Admitted for L THA, Anterior Approach;  has a past medical history of Anxiety, Asthma, Chronic lower back pain, Dysrhythmia,  Hypothyroidism, Kidney disease, chronic, stage II (GFR 60-89 ml/min), Migraine, Neuromuscular disorder (HCC), PONV (postoperative nausea and vomiting), and Scoliosis.    PT Comments    Continuing work on functional mobility and activity tolerance;  BP drop with upright activity persists, limiting ability to initiate gait training; RN aware;    06/15/18 1429 06/15/18 1438 06/15/18 1441  Orthostatic Lying   BP- Lying 128/75  --   --   Pulse- Lying 80  --   --   Orthostatic Sitting  BP- Sitting 98/60 94/62 (!) 78/55 (after stand pivot transfer to the recliner)  Pulse- Sitting 118 112 118    06/15/18 1442  Orthostatic Lying   BP- Lying 104/69 (semi-supine in recliner)  Pulse- Lying 103  Orthostatic Sitting  BP- Sitting  --   Pulse- Sitting  --       Follow Up Recommendations  Follow surgeon's recommendation for DC plan and follow-up therapies;Supervision/Assistance - 24 hour     Equipment Recommendations  Rolling walker with 5" wheels;3in1 (PT)    Recommendations for Other Services       Precautions / Restrictions Precautions Precautions: Fall Precaution Comments: Orthostatic on eval Restrictions Weight Bearing Restrictions: No    Mobility  Bed Mobility Overal bed mobility: Needs Assistance Bed Mobility: Supine to Sit     Supine to sit: Min assist     General bed mobility comments: Cues for technqiue; Slow moving, but smoother motion than this am; min assist to help LLE moving in the bed  Transfers Overall transfer level: Needs assistance Equipment used: 2 person hand held assist Transfers: Stand Pivot Transfers   Stand pivot transfers: Mod assist;+2 safety/equipment        General transfer comment: Opted for basic stand pivot transfer due to decr upright activity tolerance  Ambulation/Gait             General Gait Details: Unable this session due to BP drop with upright activity   Stairs             Wheelchair Mobility    Modified Rankin (Stroke Patients Only)       Balance Overall balance assessment: Needs assistance   Sitting balance-Leahy Scale: Fair Sitting balance - Comments: Once established that she was dizzy in sitting, opted to give her sitting support while orthostatics were taken; able to sit up without external support initially     Standing balance-Leahy Scale: Poor                              Cognition Arousal/Alertness: Awake/alert Behavior During Therapy: WFL for tasks assessed/performed Overall Cognitive Status: Within Functional Limits for tasks assessed                                        Exercises      General Comments General comments (skin integrity, edema, etc      Pertinent Vitals/Pain Pain Assessment: 0-10 Pain Score: 5  Pain Location: L hip Pain Descriptors / Indicators: Grimacing;Guarding Pain Intervention(s): Monitored during session    Home Living  Prior Function            PT Goals (current goals can now be found in the care plan section) Acute Rehab PT Goals Patient Stated Goal: Back to exercise PT Goal Formulation: With patient Time For Goal Achievement: 06/22/18 Potential to Achieve Goals: Good Progress towards PT goals: Progressing toward goals    Frequency    7X/week      PT Plan Current plan remains appropriate    Co-evaluation PT/OT/SLP Co-Evaluation/Treatment: Yes Reason for Co-Treatment: For patient/therapist safety;To address functional/ADL transfers(significantly decr activity tolerance) PT goals addressed during session: Mobility/safety with mobility        AM-PAC PT "6 Clicks" Daily Activity   Outcome Measure  Difficulty turning over in bed (including adjusting bedclothes, sheets and blankets)?: A Lot Difficulty moving from lying on back to sitting on the side of the bed? : Unable Difficulty sitting down on and standing up from a chair with arms (e.g., wheelchair, bedside commode, etc,.)?: Unable Help needed moving to and from a bed to chair (including a wheelchair)?: A Lot Help needed walking in hospital room?: A Lot Help needed climbing 3-5 steps with a railing? : Total 6 Click Score: 9    End of Session Equipment Utilized During Treatment: Gait belt Activity Tolerance: Other (comment)(limited by BP drop in standing) Patient left: in chair;with call bell/phone within reach;Other (comment)(recliner in semi-supine position) Nurse Communication: Mobility status;Other (comment)(BP drop with activity) PT Visit Diagnosis: Other abnormalities of gait and mobility (R26.89);Pain Pain - Right/Left: Left Pain - part of body: Hip     Time: 1610-9604 PT Time Calculation (min) (ACUTE ONLY): 32 min  Charges:  $Therapeutic Activity: 8-22 mins                     Nicole Morris, PT  Acute Rehabilitation Services Pager [phone] Office [phone]    Nicole Morris 06/15/2018, 4:07 PM

## 2018-06-15 NOTE — Evaluation (Signed)
Physical Therapy Evaluation Patient Details Name: Nicole Morris MRN: [id number] DOB: [birth year] Today's Date: 06/15/2018   History of Present Illness  Admitted for L THA, Anterior Approach;  has a past medical history of Anxiety, Asthma, Chronic lower back pain, Dysrhythmia,  Hypothyroidism, Kidney disease, chronic, stage II (GFR 60-89 ml/min), Migraine, Neuromuscular disorder (HCC), PONV (postoperative nausea and vomiting), and Scoliosis.  Clinical Impression   Patient is s/p above surgery resulting in functional limitations due to the deficits listed below (see PT Problem List). Independent prior to admission; Presents with decr activity tolerance, orthostatic hypotension, post-op pain; Today's eval limited by BP drop in standing; Once BP is stable, I anticipate good progress;  Patient will benefit from skilled PT to increase their independence and safety with mobility to allow discharge to the venue listed below.       Follow Up Recommendations Follow surgeon's recommendation for DC plan and follow-up therapies;Supervision/Assistance - 24 hour    Equipment Recommendations  Rolling walker with 5" wheels;3in1 (PT)    Recommendations for Other Services OT consult(as ordered)     Precautions / Restrictions Precautions Precautions: Fall Precaution Comments: Orthostatic on eval Restrictions Weight Bearing Restrictions: No      Mobility  Bed Mobility Overal bed mobility: Needs Assistance Bed Mobility: Supine to Sit     Supine to sit: Mod assist     General bed mobility comments: Cues for technqiue; hesitant to move, and very stiff; light mod assist to scoot hips to EOB, mod assist to elevate trunk to sit  Transfers Overall transfer level: Needs assistance Equipment used: Rolling walker (2 wheeled) Transfers: Sit to/from Stand Sit to Stand: Mod assist         General transfer comment: light mod assist to power up to stand; cues for hand placment and safety; very dizzy  in standing and less responsive; attempted standing BP, but unable to get a reading before she needed to sit  Ambulation/Gait             General Gait Details: Unable this session due to BP drop in standing  Stairs            Wheelchair Mobility    Modified Rankin (Stroke Patients Only)       Balance Overall balance assessment: Needs assistance           Standing balance-Leahy Scale: Poor                               Pertinent Vitals/Pain Pain Assessment: Faces Faces Pain Scale: Hurts little more Pain Location: L hip Pain Descriptors / Indicators: Grimacing;Guarding Pain Intervention(s): Monitored during session    Home Living Family/patient expects to be discharged to:: Private residence Living Arrangements: Spouse/significant other;Children Available Help at Discharge: Family;Available 24 hours/day Type of Home: House Home Access: Stairs to enter Entrance Stairs-Rails: None Entrance Stairs-Number of Steps: 1+1 Home Layout: Two level;Able to live on main level with bedroom/bathroom;1/2 bath on main level;Bed/bath upstairs Home Equipment: None      Prior Function Level of Independence: Independent               Hand Dominance        Extremity/Trunk Assessment   Upper Extremity Assessment Upper Extremity Assessment: Defer to OT evaluation    Lower Extremity Assessment Lower Extremity Assessment: LLE deficits/detail LLE Deficits / Details: Grossly decr AROM and strength, limited by pain postop       Communication  Communication: No difficulties  Cognition Arousal/Alertness: Awake/alert Behavior During Therapy: WFL for tasks assessed/performed Overall Cognitive Status: Within Functional Limits for tasks assessed                                        General Comments General comments (skin integrity, edema, etc.):   06/15/18 0830 06/15/18 0835 06/15/18 0840  Orthostatic Sitting  BP- Sitting 100/76  (!) 68/51 (after attempted BP in standing, but pt too dizzy) 97/68 (bed in semi-chair position)  Pulse- Sitting 101 99 94       Exercises     Assessment/Plan    PT Assessment Patient needs continued PT services  PT Problem List Decreased strength;Decreased range of motion;Decreased activity tolerance;Decreased balance;Decreased mobility;Decreased knowledge of use of DME;Decreased safety awareness;Decreased knowledge of precautions;Cardiopulmonary status limiting activity;Pain       PT Treatment Interventions DME instruction;Gait training;Stair training;Functional mobility training;Therapeutic activities;Therapeutic exercise;Balance training;Neuromuscular re-education;Patient/family education    PT Goals (Current goals can be found in the Care Plan section)  Acute Rehab PT Goals Patient Stated Goal: Did not state; agreeable to get OOB PT Goal Formulation: With patient Time For Goal Achievement: 06/22/18 Potential to Achieve Goals: Good    Frequency 7X/week   Barriers to discharge        Co-evaluation               AM-PAC PT "6 Clicks" Daily Activity  Outcome Measure Difficulty turning over in bed (including adjusting bedclothes, sheets and blankets)?: A Lot Difficulty moving from lying on back to sitting on the side of the bed? : Unable Difficulty sitting down on and standing up from a chair with arms (e.g., wheelchair, bedside commode, etc,.)?: Unable Help needed moving to and from a bed to chair (including a wheelchair)?: A Lot Help needed walking in hospital room?: A Lot Help needed climbing 3-5 steps with a railing? : Total 6 Click Score: 9    End of Session Equipment Utilized During Treatment: Other (comment)(vitals machine) Activity Tolerance: Other (comment)(limited by BP drop in standing) Patient left: in bed;with call bell/phone within reach;Other (comment)(bed in semi-chair position) Nurse Communication: Mobility status PT Visit Diagnosis: Other  abnormalities of gait and mobility (R26.89);Pain Pain - Right/Left: Left Pain - part of body: Hip    Time: 0800-0840 PT Time Calculation (min) (ACUTE ONLY): 40 min   Charges:   PT Evaluation $PT Eval Moderate Complexity: 1 Mod PT Treatments $Therapeutic Activity: 23-37 mins        Van Clines, PT  Acute Rehabilitation Services Pager [phone] Office [phone]   [name] 06/15/2018, 11:53 AM

## 2018-06-15 NOTE — Evaluation (Signed)
Occupational Therapy Evaluation Patient Details Name: Nicole Morris MRN: [id number] DOB: 01-27-1961 Today's Date: 06/15/2018    History of Present Illness Admitted for L THA, Anterior Approach;  has a past medical history of Anxiety, Asthma, Chronic lower back pain, Dysrhythmia,  Hypothyroidism, Kidney disease, chronic, stage II (GFR 60-89 ml/min), Migraine, Neuromuscular disorder ([place]), PONV (postoperative nausea and vomiting), and Scoliosis.   Clinical Impression   PTA Pt independent in ADL and mobility. Pt is currently limited in activity tolerance by orthostatics (see PT note for exact vital information). However Pt is very motivated and was able to complete bed to recliner transfer with mod A +2 assist this session. Pt is mod to max A for LB ADL at this time due to decreased ROM post-op and set up for UB/grooming/self-feeding. Pt will benefit from skilled OT in the acute setting to maximize safety and independence in ADL and functional transfers. Next session to bring AE kit for education (not initiated today) for LB ADL - and work on transfers further.     Follow Up Recommendations  Follow surgeon's recommendation for DC plan and follow-up therapies;Supervision/Assistance - 24 hour    Equipment Recommendations  3 in 1 bedside commode    Recommendations for Other Services PT consult     Precautions / Restrictions Precautions Precautions: Fall Precaution Comments: orthostatic  Restrictions Weight Bearing Restrictions: No      Mobility Bed Mobility Overal bed mobility: Needs Assistance Bed Mobility: Supine to Sit     Supine to sit: Min assist     General bed mobility comments: Cues for technqiue; Slow moving, but smoother motion than this am; min assist to help LLE moving in the bed  Transfers Overall transfer level: Needs assistance Equipment used: 2 person hand held assist Transfers: Stand Pivot Transfers   Stand pivot transfers: Mod assist;+2 safety/equipment        General transfer comment: Opted for basic stand pivot transfer due to decr upright activity tolerance    Balance Overall balance assessment: Needs assistance   Sitting balance-Leahy Scale: Fair Sitting balance - Comments: Once established that she was dizzy in sitting, opted to give her sitting support while orthostatics were taken; able to sit up without external support initially     Standing balance-Leahy Scale: Poor                             ADL either performed or assessed with clinical judgement   ADL Overall ADL's : Needs assistance/impaired Eating/Feeding: Modified independent Eating/Feeding Details (indicate cue type and reason): in seated position Grooming: Set up;Sitting Grooming Details (indicate cue type and reason): limited seated tolerance due to orthostatics Upper Body Bathing: Set up;Sitting   Lower Body Bathing: Moderate assistance;Sit to/from stand   Upper Body Dressing : Set up   Lower Body Dressing: Maximal assistance;Sit to/from stand Lower Body Dressing Details (indicate cue type and reason): unable to bring feet across to knees for LB dressing at this time Toilet Transfer: Moderate assistance;+2 for physical assistance;+2 for safety/equipment;Stand-pivot;BSC;RW Toilet Transfer Details (indicate cue type and reason): mod +2 for boost, and safety with orthostatics Toileting- Clothing Manipulation and Hygiene: Maximal assistance;+2 for physical assistance;+2 for safety/equipment;Sit to/from stand       Functional mobility during ADLs: Moderate assistance;+2 for physical assistance;+2 for safety/equipment;Rolling walker(SPT only) General ADL Comments: limited activity tolerance due to orthostatics     Vision Patient Visual Report: No change from baseline  Perception     Praxis      Pertinent Vitals/Pain Pain Assessment: 0-10 Pain Score: 5  Pain Location: L hip Pain Descriptors / Indicators: Grimacing;Guarding Pain  Intervention(s): Limited activity within patient's tolerance;Repositioned;Monitored during session     Hand Dominance     Extremity/Trunk Assessment Upper Extremity Assessment Upper Extremity Assessment: Overall WFL for tasks assessed   Lower Extremity Assessment Lower Extremity Assessment: Defer to PT evaluation       Communication Communication Communication: No difficulties   Cognition Arousal/Alertness: Awake/alert Behavior During Therapy: WFL for tasks assessed/performed Overall Cognitive Status: Within Functional Limits for tasks assessed                                     General Comments  please see PT note/vital information for orthostatic numbers; son present throughout session    Exercises     Shoulder Instructions      Home Living Family/patient expects to be discharged to:: Private residence Living Arrangements: Spouse/significant other;Children Available Help at Discharge: Family;Available 24 hours/day Type of Home: House Home Access: Stairs to enter [employer] of Steps: 1+1 Entrance Stairs-Rails: None Home Layout: Two level;Able to live on main level with bedroom/bathroom;1/2 bath on main level;Bed/bath upstairs Alternate Level Stairs-Number of Steps: flight   Bathroom Shower/Tub: Teacher, early years/pre: Standard Bathroom Accessibility: Yes How Accessible: Accessible via walker            Prior Functioning/Environment Level of Independence: Independent                 OT Problem List: Decreased range of motion;Decreased activity tolerance;Impaired balance (sitting and/or standing);Decreased knowledge of use of DME or AE;Pain      OT Treatment/Interventions: Self-care/ADL training;Energy conservation;Therapeutic activities;Patient/family education;Balance training    OT Goals(Current goals can be found in the care plan section) Acute Rehab OT Goals Patient Stated Goal: Back to exercise OT Goal  Formulation: With patient/family Time For Goal Achievement: 06/29/18 Potential to Achieve Goals: Good ADL Goals Pt Will Perform Grooming: with modified independence;standing Pt Will Perform Lower Body Bathing: with modified independence;with adaptive equipment;sitting/lateral leans Pt Will Perform Lower Body Dressing: with modified independence;sit to/from stand;with adaptive equipment Pt Will Transfer to Toilet: with modified independence;ambulating Pt Will Perform Toileting - Clothing Manipulation and hygiene: with modified independence;sit to/from stand Pt Will Perform Tub/Shower Transfer: Tub transfer;anterior/posterior transfer;3 in 1;rolling walker  OT Frequency: Min 3X/week   Barriers to D/C:            Co-evaluation PT/OT/SLP Co-Evaluation/Treatment: Yes Reason for Co-Treatment: For patient/therapist safety;To address functional/ADL transfers;Other (comment)(significantly decreased activity tolerance) PT goals addressed during session: Mobility/safety with mobility;Balance OT goals addressed during session: ADL's and self-care      AM-PAC PT "6 Clicks" Daily Activity     Outcome Measure Help from another person eating meals?: None Help from another person taking care of personal grooming?: None(in sitting) Help from another person toileting, which includes using toliet, bedpan, or urinal?: A Lot Help from another person bathing (including washing, rinsing, drying)?: A Lot Help from another person to put on and taking off regular upper body clothing?: None Help from another person to put on and taking off regular lower body clothing?: A Lot 6 Click Score: 18   End of Session Equipment Utilized During Treatment: Gait belt;Rolling walker Nurse Communication: Mobility status;Other (comment)(check on Pt in 30 min)  Activity Tolerance: Treatment limited  secondary to medical complications (Comment)(orthostatic) Patient left: in chair;with call bell/phone within reach;with  family/visitor present  OT Visit Diagnosis: Unsteadiness on feet (R26.81);Other abnormalities of gait and mobility (R26.89);Dizziness and giddiness (R42);Pain Pain - Right/Left: Left Pain - part of body: Hip                Time: 5520-8022 OT Time Calculation (min): 27 min Charges:  OT General Charges $OT Visit: 1 Visit OT Evaluation $OT Eval Moderate Complexity: [place] OTR/L Acute Rehabilitation Services Pager: [phone] Office: [place] 06/15/2018, 5:08 PM

## 2018-06-16 LAB — CBC
HCT: 28.6 % — ABNORMAL LOW (ref 36.0–46.0)
Hemoglobin: 8.7 g/dL — ABNORMAL LOW (ref 12.0–15.0)
MCH: 29.6 pg (ref 26.0–34.0)
MCHC: 30.4 g/dL (ref 30.0–36.0)
MCV: 97.3 fL (ref 80.0–100.0)
Platelets: 145 10*3/uL — ABNORMAL LOW (ref 150–400)
RBC: 2.94 MIL/uL — ABNORMAL LOW (ref 3.87–5.11)
RDW: 13.9 % (ref 11.5–15.5)
WBC: 6.2 10*3/uL (ref 4.0–10.5)
nRBC: 0 % (ref 0.0–0.2)

## 2018-06-16 NOTE — Progress Notes (Signed)
Occupational Therapy Treatment Patient Details Name: Nicole Morris MRN: [id number] DOB: [birth year] Today's Date: 06/16/2018    History of present illness Admitted for L THA, Anterior Approach;  has a past medical history of Anxiety, Asthma, Chronic lower back pain, Dysrhythmia,  Hypothyroidism, Kidney disease, chronic, stage II (GFR 60-89 ml/min), Migraine, Neuromuscular disorder (HCC), PONV (postoperative nausea and vomiting), and Scoliosis.   OT comments  Pt progressing towards OT goals this session. Pt remains orthostatic, but activity tolerance improving. Pt is Mod A for transfers with RW, able to tolerate sitting this session and participate in grooming activities. Next session plan to educated on AE for LB ADL and continued work on transfers. At this time, Pt continues to require SNF post-acute for safety and increased therapy. Will closely monitor tomorrow for [facility] possibility?  Orthostatics as follows: Laying: 88/65 Sitting: 87/62 (71) Standing: 75/45 (55) Sitting: 94/59 (71) Standing: 81/54 (64) Standing 1.5 min 74/53 (62)  Follow Up Recommendations  Follow surgeon's recommendation for DC plan and follow-up therapies;Supervision/Assistance - 24 hour    Equipment Recommendations  3 in 1 bedside commode    Recommendations for Other Services PT consult    Precautions / Restrictions Precautions Precautions: Fall Precaution Comments: orthostatic  Restrictions Weight Bearing Restrictions: Yes LLE Weight Bearing: Weight bearing as tolerated       Mobility Bed Mobility Overal bed mobility: Needs Assistance Bed Mobility: Supine to Sit     Supine to sit: Min guard     General bed mobility comments: min guard for safety, min vc  Transfers Overall transfer level: Needs assistance Equipment used: Rolling walker (2 wheeled);2 person hand held assist Transfers: Sit to/from [employer] Sit to Stand: Mod assist Stand pivot transfers: Min assist        General transfer comment: performed SPT with 2 person HHA, then able to perform multiple sit <>stands with RW and mod A     Balance Overall balance assessment: Needs assistance Sitting-balance support: Bilateral upper extremity supported Sitting balance-Leahy Scale: Good     Standing balance support: Bilateral upper extremity supported Standing balance-Leahy Scale: Poor Standing balance comment: dependent on BUE support on RW                           ADL either performed or assessed with clinical judgement   ADL Overall ADL's : Needs assistance/impaired     Grooming: Wash/dry hands;Wash/dry face;Set up;Sitting;Oral care Grooming Details (indicate cue type and reason): able to complete in recliner             Lower Body Dressing: Maximal assistance;Sit to/from stand Lower Body Dressing Details (indicate cue type and reason): unable to bring feet across to knees for LB dressing at this time Toilet Transfer: +2 for safety/equipment;Moderate assistance;Stand-pivot;RW Toilet Transfer Details (indicate cue type and reason): simulated through recliner transfer Toileting- Clothing Manipulation and Hygiene: Min guard;Sitting/lateral lean         General ADL Comments: limited activity tolerance due to orthostatics     Vision       Perception     Praxis      Cognition Arousal/Alertness: Awake/alert Behavior During Therapy: WFL for tasks assessed/performed Overall Cognitive Status: Within Functional Limits for tasks assessed                                          Exercises  Shoulder Instructions       General Comments      Pertinent Vitals/ Pain       Pain Assessment: 0-10 Pain Score: 5  Pain Location: L hip Pain Descriptors / Indicators: Grimacing;Guarding Pain Intervention(s): Monitored during session;Repositioned  Home Living                                          Prior Functioning/Environment               Frequency  Min 3X/week        Progress Toward Goals  OT Goals(current goals can now be found in the care plan section)  Progress towards OT goals: Progressing toward goals  Acute Rehab OT Goals Patient Stated Goal: Back to exercise OT Goal Formulation: With patient/family Time For Goal Achievement: 06/29/18 Potential to Achieve Goals: Good  Plan Discharge plan remains appropriate;Frequency remains appropriate    Co-evaluation    PT/OT/SLP Co-Evaluation/Treatment: Yes Reason for Co-Treatment: For patient/therapist safety;To address functional/ADL transfers;Other (comment)(orthostatic) PT goals addressed during session: Mobility/safety with mobility;Balance;Proper use of DME;Strengthening/ROM OT goals addressed during session: ADL's and self-care;Proper use of Adaptive equipment and DME;Strengthening/ROM      AM-PAC PT "6 Clicks" Daily Activity     Outcome Measure   Help from another person eating meals?: None Help from another person taking care of personal grooming?: None(in sitting) Help from another person toileting, which includes using toliet, bedpan, or urinal?: A Lot Help from another person bathing (including washing, rinsing, drying)?: A Lot Help from another person to put on and taking off regular upper body clothing?: None Help from another person to put on and taking off regular lower body clothing?: A Lot 6 Click Score: 18    End of Session Equipment Utilized During Treatment: Gait belt;Rolling walker  OT Visit Diagnosis: Unsteadiness on feet (R26.81);Other abnormalities of gait and mobility (R26.89);Dizziness and giddiness (R42);Pain Pain - Right/Left: Left Pain - part of body: Hip   Activity Tolerance Treatment limited secondary to medical complications (Comment)(orthostatics)   Patient Left in chair;with call bell/phone within reach;with family/visitor present   Nurse Communication Mobility status;Other (comment)        Time:  1610-9604 OT Time Calculation (min): 25 min  Charges: OT General Charges $OT Visit: 1 Visit OT Treatments $Self Care/Home Management : 8-22 mins  Sherryl Manges OTR/L Acute Rehabilitation Services Pager: [phone] Office: [phone]   Nicole Morris 06/16/2018, 5:04 PM

## 2018-06-16 NOTE — Progress Notes (Signed)
Physical Therapy Treatment Patient Details Name: Nicole Morris MRN: [id number] DOB: June 03, 1961 Today's Date: 06/16/2018    History of Present Illness Admitted for L THA, Anterior Approach;  has a past medical history of Anxiety, Asthma, Chronic lower back pain, Dysrhythmia,  Hypothyroidism, Kidney disease, chronic, stage II (GFR 60-89 ml/min), Migraine, Neuromuscular disorder (HCC), PONV (postoperative nausea and vomiting), and Scoliosis.    PT Comments    Pt eager to get up with therapy despite earlier nausea. Pt remains orthostatic, but activity tolerance improving. Pt is min guard for bed mobility, and Mod A for transfers with RW. With current mobility pt will require SNF level rehab before going home, however if orthostatic hypotension is resolved, PT anticipates pt will be able to progress her mobility enough for d/c home with HHPT. PT will reevaluate tomorrow.     Follow Up Recommendations  Follow surgeon's recommendation for DC plan and follow-up therapies;Supervision/Assistance - 24 hour     Equipment Recommendations  Rolling walker with 5" wheels;3in1 (PT)       Precautions / Restrictions Precautions Precautions: Fall Precaution Comments: orthostatic  Restrictions Weight Bearing Restrictions: Yes LLE Weight Bearing: Weight bearing as tolerated    Mobility  Bed Mobility Overal bed mobility: Needs Assistance Bed Mobility: Supine to Sit     Supine to sit: Min guard     General bed mobility comments: min guard for safety, min vc  Transfers Overall transfer level: Needs assistance Equipment used: Rolling walker (2 wheeled);2 person hand held assist Transfers: Sit to/from [employer] Sit to Stand: Mod assist Stand pivot transfers: Min assist       General transfer comment: performed SPT with 2 person HHA, then able to perform multiple sit <>stands with RW and mod A   Ambulation/Gait             General Gait Details: unable this session  due to drop in BP       Balance Overall balance assessment: Needs assistance Sitting-balance support: Bilateral upper extremity supported Sitting balance-Leahy Scale: Good     Standing balance support: Bilateral upper extremity supported Standing balance-Leahy Scale: Poor Standing balance comment: dependent on BUE support on RW                            Cognition Arousal/Alertness: Awake/alert Behavior During Therapy: WFL for tasks assessed/performed Overall Cognitive Status: Within Functional Limits for tasks assessed                                           General Comments General comments (skin integrity, edema, etc.): please see OT note/vitals information for orthostatic BP measurements      Pertinent Vitals/Pain Pain Assessment: 0-10 Pain Score: 5  Pain Location: L hip Pain Descriptors / Indicators: Grimacing;Guarding Pain Intervention(s): Limited activity within patient's tolerance;Monitored during session;Repositioned           PT Goals (current goals can now be found in the care plan section) Acute Rehab PT Goals Patient Stated Goal: Back to exercise PT Goal Formulation: With patient Time For Goal Achievement: 06/22/18 Potential to Achieve Goals: Good Progress towards PT goals: Progressing toward goals    Frequency    7X/week      PT Plan Current plan remains appropriate    Co-evaluation PT/OT/SLP Co-Evaluation/Treatment: Yes Reason for Co-Treatment: For patient/therapist safety(orthostatic) PT goals  addressed during session: Mobility/safety with mobility OT goals addressed during session: ADL's and self-care;Proper use of Adaptive equipment and DME;Strengthening/ROM      AM-PAC PT "6 Clicks" Daily Activity  Outcome Measure  Difficulty turning over in bed (including adjusting bedclothes, sheets and blankets)?: A Lot Difficulty moving from lying on back to sitting on the side of the bed? : Unable Difficulty  sitting down on and standing up from a chair with arms (e.g., wheelchair, bedside commode, etc,.)?: Unable Help needed moving to and from a bed to chair (including a wheelchair)?: A Little Help needed walking in hospital room?: A Lot Help needed climbing 3-5 steps with a railing? : Total 6 Click Score: 10    End of Session Equipment Utilized During Treatment: Gait belt Activity Tolerance: Other (comment)(limited by BP drop in standing) Patient left: in chair;with call bell/phone within reach;Other (comment)(recliner in semi-supine position) Nurse Communication: Mobility status;Other (comment)(BP drop with activity) PT Visit Diagnosis: Other abnormalities of gait and mobility (R26.89);Pain Pain - Right/Left: Left Pain - part of body: Hip     Time: 1455-1520 PT Time Calculation (min) (ACUTE ONLY): 25 min  Charges:  $Therapeutic Activity: 8-22 mins                     Yuliya Nova B. Beverely Risen PT, DPT Acute Rehabilitation Services Pager [phone] Office [phone]    Elon Alas Fleet 06/16/2018, 6:05 PM

## 2018-06-16 NOTE — Progress Notes (Signed)
Subjective: 2 Days Post-Op Procedure(s) (LRB): LEFT TOTAL HIP ARTHROPLASTY ANTERIOR APPROACH (Left) Patient reports pain as moderate.  Mobility significantly slowed by pain and nausea.  Acute blood loss anmeia from surgery - slightly hypotensive when up.  Objective: Vital signs in last 24 hours: Temp:  [98.5 F (36.9 C)-100.8 F (38.2 C)] 98.5 F (36.9 C) (10/10 0405) Pulse Rate:  [81-112] 81 (10/10 0405) Resp:  [18-20] 19 (10/10 0405) BP: (89-117)/(61-73) 94/61 (10/10 0405) SpO2:  [95 %-97 %] 95 % (10/10 0405)  Intake/Output from previous day: 10/09 0701 - 10/10 0700 In: 1490.4 [P.O.:360; I.V.:1130.4] Out: 800 [Urine:800] Intake/Output this shift: No intake/output data recorded.  Recent Labs    06/15/18 0421 06/16/18 0545  HGB 9.6* 8.7*   Recent Labs    06/15/18 0421 06/16/18 0545  WBC 6.6 6.2  RBC 3.22* 2.94*  HCT 30.4* 28.6*  PLT 177 145*   Recent Labs    06/15/18 0421  NA 138  K 5.0  CL 108  CO2 25  BUN 12  CREATININE 1.02*  GLUCOSE 124*  CALCIUM 8.2*   No results for input(s): LABPT, INR in the last 72 hours.  Sensation intact distally Intact pulses distally Dorsiflexion/Plantar flexion intact Incision: dressing C/D/I   Assessment/Plan: 2 Days Post-Op Procedure(s) (LRB): LEFT TOTAL HIP ARTHROPLASTY ANTERIOR APPROACH (Left) Up with therapy  Will check CBC again tomorrow (hgb 8.7). Still would like to go home with HHPT; we will need to re-evaluate the safety of this vs SNF if mobility does not improve.  I have communicated this with her and her family.    Kathryne Hitch 06/16/2018, 11:28 AM

## 2018-06-16 NOTE — Discharge Instructions (Signed)

## 2018-06-17 LAB — CBC
HCT: 23.7 % — ABNORMAL LOW (ref 36.0–46.0)
Hemoglobin: 7.5 g/dL — ABNORMAL LOW (ref 12.0–15.0)
MCH: 30.7 pg (ref 26.0–34.0)
MCHC: 31.6 g/dL (ref 30.0–36.0)
MCV: 97.1 fL (ref 80.0–100.0)
Platelets: 131 10*3/uL — ABNORMAL LOW (ref 150–400)
RBC: 2.44 MIL/uL — ABNORMAL LOW (ref 3.87–5.11)
RDW: 13.3 % (ref 11.5–15.5)
WBC: 4.7 10*3/uL (ref 4.0–10.5)
nRBC: 0 % (ref 0.0–0.2)

## 2018-06-17 LAB — PREPARE RBC (CROSSMATCH)

## 2018-06-17 MED ORDER — SODIUM CHLORIDE 0.9% IV SOLUTION
Freq: Once | INTRAVENOUS | Status: AC
  Administered 2018-06-18: via INTRAVENOUS

## 2018-06-17 MED ORDER — ASPIRIN 81 MG PO CHEW: 81.0000 mg | CHEWABLE_TABLET | Freq: Two times a day (BID) | ORAL | 0 refills | Status: DC

## 2018-06-17 MED ORDER — OXYCODONE HCL 5 MG PO TABS: 5.0000 mg | ORAL_TABLET | ORAL | 0 refills | Status: DC | PRN

## 2018-06-17 MED ORDER — ONDANSETRON 4 MG PO TBDP: 4.0000 mg | ORAL_TABLET | Freq: Three times a day (TID) | ORAL | 0 refills | Status: DC | PRN

## 2018-06-17 NOTE — Progress Notes (Signed)
Patient ID: Nicole Morris, female   DOB: 1961/09/04, 57 y.o.   MRN: [id number] She has had a much better day with mobility and her therapy.  Walked much further.  Her blood pressure has run a little low and she does feel fatigued.  Given her hgb of 7.5 and her current symptoms from her post-surgical acute blood loss anemia, she agrees to a blood transfusion.  I anticipate her going home on Sunday.

## 2018-06-17 NOTE — Care Management Note (Signed)
Case Management Note  Patient Details  Name: Nicole Morris MRN: [id number] Date of Birth: [birth year]  Subjective/Objective:    57 yr old female s/p left total hip arthroplasty.                Action/Plan: Case manager spoke with patient concerning discharge plan and DME. Choice for Home Health service was offered, patient says she was preoperatively setup with [facility], no changes. She has 3in1 at Home, will need RW. Patient has family support at discharge.    Expected Discharge Date:   06/17/18           Expected Discharge Plan:  (P) Home w Home Health Services  In-House Referral:     Discharge planning Services  (P) CM Consult  Post Acute Care Choice:  (P) Home Health, Durable Medical Equipment Choice offered to:  (P) Patient  DME Arranged:  (P) Walker rolling DME Agency:  (P) [facility]  HH Arranged:  (P) PT HH Agency:  (P) [facility] ([facility]  Status of Service:  (P) Completed, signed off  If discussed at Long Length of Stay Meetings, dates discussed:    Additional Comments:  Durenda Guthrie, RN 06/17/2018, 1:29 PM

## 2018-06-17 NOTE — Progress Notes (Signed)
Patient ID: Nicole Morris, female   DOB: 1961-01-25, 57 y.o.   MRN: [id number] Still very slow progress.  Slightly better in the afternoon yesterday.  Will need to continue therapy today.  Vitals better today.  Will check CBC.  Will need guidance from PT as to whether or not the patient can go home soon vs skilled nursing.  Will check on her again late this afternoon.

## 2018-06-17 NOTE — Progress Notes (Signed)
Occupational Therapy Treatment Patient Details Name: Nicole Morris MRN: [id number] DOB: [birth year] Today's Date: 06/17/2018    History of present illness Admitted for L THA, Anterior Approach;  has a past medical history of Anxiety, Asthma, Chronic lower back pain, Dysrhythmia,  Hypothyroidism, Kidney disease, chronic, stage II (GFR 60-89 ml/min), Migraine, Neuromuscular disorder (HCC), PONV (postoperative nausea and vomiting), and Scoliosis.   OT comments  Pt. Reports feeling much better than previous sessions.  States minimal "light headed" feeling that resolves quickly.  Able to amb. To/from b.room for toileting tasks. Will cont. To follow with current poc in preparation for home.  Follow Up Recommendations  Follow surgeon's recommendation for DC plan and follow-up therapies;Supervision/Assistance - 24 hour    Equipment Recommendations  3 in 1 bedside commode    Recommendations for Other Services      Precautions / Restrictions Precautions Precautions: Fall Precaution Comments: watch BP  Restrictions Weight Bearing Restrictions: Yes LLE Weight Bearing: Weight bearing as tolerated       Mobility Bed Mobility               General bed mobility comments: pt OOB in chair upon arrival and at end of session  Transfers Overall transfer level: Needs assistance Equipment used: Rolling walker (2 wheeled) Transfers: Sit to/from [employer] Sit to Stand: Min guard Stand pivot transfers: Min guard       General transfer comment: for safety; cues for hand placement    Balance Overall balance assessment: Needs assistance Sitting-balance support: Feet supported;Single extremity supported Sitting balance-Leahy Scale: Good     Standing balance support: Bilateral upper extremity supported Standing balance-Leahy Scale: Poor                             ADL either performed or assessed with clinical judgement   ADL Overall ADL's : Needs  assistance/impaired     Grooming: Wash/dry hands;Standing;Supervision/safety                   Toilet Transfer: TEFL teacher Details (indicate cue type and reason): pt. performs self cath with set up seated  Toileting- Clothing Manipulation and Hygiene: Supervision/safety;Sitting/lateral lean       Functional mobility during ADLs: Supervision/safety;Min guard General ADL Comments: pt. reported initial light dizziness but it quickly subisded and she was ready to amb. to b.room (followed with chair initially but was not needed)     Vision       Perception     Praxis      Cognition Arousal/Alertness: Awake/alert Behavior During Therapy: WFL for tasks assessed/performed Overall Cognitive Status: Within Functional Limits for tasks assessed                                          Exercises     Shoulder Instructions       General Comments      Pertinent Vitals/ Pain       Pain Assessment: 0-10 Pain Score: 6  Faces Pain Scale: Hurts little more Pain Location: Hip Pain Descriptors / Indicators: Aching Pain Intervention(s): RN gave pain meds during session  Home Living  Prior Functioning/Environment              Frequency  Min 3X/week        Progress Toward Goals  OT Goals(current goals can now be found in the care plan section)  Progress towards OT goals: Progressing toward goals     Plan Discharge plan remains appropriate;Frequency remains appropriate    Co-evaluation                 AM-PAC PT "6 Clicks" Daily Activity     Outcome Measure   Help from another person eating meals?: None Help from another person taking care of personal grooming?: None Help from another person toileting, which includes using toliet, bedpan, or urinal?: A Lot Help from another person bathing (including washing, rinsing, drying)?: A  Lot Help from another person to put on and taking off regular upper body clothing?: None Help from another person to put on and taking off regular lower body clothing?: A Lot 6 Click Score: 18    End of Session Equipment Utilized During Treatment: Gait belt;Rolling walker  OT Visit Diagnosis: Unsteadiness on feet (R26.81);Other abnormalities of gait and mobility (R26.89);Dizziness and giddiness (R42);Pain Pain - Right/Left: Left Pain - part of body: Hip   Activity Tolerance Patient tolerated treatment well   Patient Left in chair;with call bell/phone within reach   Nurse Communication          Time: 6213-0865 OT Time Calculation (min): 14 min  Charges: OT Treatments $Self Care/Home Management : 8-22 mins   Robet Leu, COTA/L 06/17/2018, 2:23 PM

## 2018-06-17 NOTE — Progress Notes (Addendum)
Physical Therapy Treatment Patient Details Name: Nicole Morris MRN: [id number] DOB: May 05, 1961 Today's Date: 06/17/2018    History of Present Illness Admitted for L THA, Anterior Approach;  has a past medical history of Anxiety, Asthma, Chronic lower back pain, Dysrhythmia,  Hypothyroidism, Kidney disease, chronic, stage II (GFR 60-89 ml/min), Migraine, Neuromuscular disorder (HCC), PONV (postoperative nausea and vomiting), and Scoliosis.    PT Comments    Patient seen for mobility progression. Gait training limited by drop in BP in standing and feeling of lightheadedness. Pt able to ambulate 68ft in room with RW and min A and tolerated L LE therex well. Pt is making progress with mobility and reports no increase in pain with mobility. Suspect pt will progress well when BP remains stable. Continue to progress as tolerated.   Orthostatic BP In supine 90/58 (69) In sitting 96/75 (75) In standing 81/55(64) Sitting end of session 90/69 (69)   Follow Up Recommendations  Follow surgeon's recommendation for DC plan and follow-up therapies;Supervision/Assistance - 24 hour     Equipment Recommendations  Rolling walker with 5" wheels;3in1 (PT)    Recommendations for Other Services       Precautions / Restrictions Precautions Precautions: Fall Precaution Comments: orthostatic  Restrictions Weight Bearing Restrictions: Yes LLE Weight Bearing: Weight bearing as tolerated    Mobility  Bed Mobility Overal bed mobility: Modified Independent Bed Mobility: Supine to Sit           General bed mobility comments: increased time and effort; no use of rail  Transfers Overall transfer level: Needs assistance Equipment used: Rolling walker (2 wheeled);2 person hand held assist Transfers: Sit to/from Stand Sit to Stand: Min guard         General transfer comment: min guard for safety from EOB; cues for safe hand placement  Ambulation/Gait Ambulation/Gait assistance: Min  assist Gait Distance (Feet): 20 Feet Assistive device: Rolling walker (2 wheeled) Gait Pattern/deviations: Step-through pattern;Decreased dorsiflexion - left;Decreased weight shift to left;Decreased stride length Gait velocity: decreased   General Gait Details: limited to gait in room this am due to orthostasis; cues for safe use of AD and L heel strike/toe off   Stairs             Wheelchair Mobility    Modified Rankin (Stroke Patients Only)       Balance Overall balance assessment: Needs assistance Sitting-balance support: Feet supported;Single extremity supported Sitting balance-Leahy Scale: Good     Standing balance support: Bilateral upper extremity supported Standing balance-Leahy Scale: Poor                              Cognition Arousal/Alertness: Awake/alert Behavior During Therapy: WFL for tasks assessed/performed Overall Cognitive Status: Within Functional Limits for tasks assessed                                        Exercises Total Joint Exercises Quad Sets: AROM;Both Short Arc Quad: AROM;Left Heel Slides: AROM;Left Hip ABduction/ADduction: AROM;Left Long Arc Quad: AROM;Left    General Comments        Pertinent Vitals/Pain Pain Assessment: Faces Faces Pain Scale: Hurts little more Pain Location: L hip Pain Descriptors / Indicators: Guarding;Sore Pain Intervention(s): Monitored during session;Repositioned    Home Living  Prior Function            PT Goals (current goals can now be found in the care plan section) Progress towards PT goals: Progressing toward goals    Frequency    7X/week      PT Plan Current plan remains appropriate    Co-evaluation              AM-PAC PT "6 Clicks" Daily Activity  Outcome Measure  Difficulty turning over in bed (including adjusting bedclothes, sheets and blankets)?: A Lot Difficulty moving from lying on back to sitting on  the side of the bed? : A Lot Difficulty sitting down on and standing up from a chair with arms (e.g., wheelchair, bedside commode, etc,.)?: Unable Help needed moving to and from a bed to chair (including a wheelchair)?: A Little Help needed walking in hospital room?: A Little Help needed climbing 3-5 steps with a railing? : A Lot 6 Click Score: 13    End of Session Equipment Utilized During Treatment: Gait belt Activity Tolerance: Other (comment)(limited by BP drop in standing) Patient left: in chair;with call bell/phone within reach;with family/visitor present Nurse Communication: Mobility status;Other (comment)(BP drop with activity) PT Visit Diagnosis: Other abnormalities of gait and mobility (R26.89);Pain Pain - Right/Left: Left Pain - part of body: Hip     Time: 0828-0900 PT Time Calculation (min) (ACUTE ONLY): 32 min  Charges:  $Gait Training: 8-22 mins $Therapeutic Exercise: 8-22 mins                     Erline Levine, PTA Acute Rehabilitation Services Pager: [phone] Office: [phone]     Carolynne Edouard 06/17/2018, 9:15 AM

## 2018-06-17 NOTE — Progress Notes (Signed)
Physical Therapy Treatment Patient Details Name: Nicole Morris MRN: [id number] DOB: 03/01/1961 Today's Date: 06/17/2018    History of Present Illness Admitted for L THA, Anterior Approach;  has a past medical history of Anxiety, Asthma, Chronic lower back pain, Dysrhythmia,  Hypothyroidism, Kidney disease, chronic, stage II (GFR 60-89 ml/min), Migraine, Neuromuscular disorder (HCC), PONV (postoperative nausea and vomiting), and Scoliosis.    PT Comments    Patient is progressing well toward PT goals and with no complaint of lightheadedness/dizziness while ambulating this session. Pt able to ambulate 186ft with RW and min guard assist. Patient needs to practice stairs next session.   Continue to progress as tolerated.   Follow Up Recommendations  Follow surgeon's recommendation for DC plan and follow-up therapies;Supervision/Assistance - 24 hour     Equipment Recommendations  Rolling walker with 5" wheels;3in1 (PT)    Recommendations for Other Services       Precautions / Restrictions Precautions Precautions: Fall Precaution Comments: watch BP  Restrictions Weight Bearing Restrictions: Yes LLE Weight Bearing: Weight bearing as tolerated    Mobility  Bed Mobility               General bed mobility comments: pt OOB in chair upon arrival  Transfers Overall transfer level: Needs assistance Equipment used: Rolling walker (2 wheeled);2 person hand held assist Transfers: Sit to/from Stand Sit to Stand: Min guard         General transfer comment: for safety; cues for hand placement  Ambulation/Gait Ambulation/Gait assistance: Min guard Gait Distance (Feet): 150 Feet Assistive device: Rolling walker (2 wheeled) Gait Pattern/deviations: Step-through pattern;Decreased dorsiflexion - left;Decreased weight shift to left;Decreased stance time - left;Decreased stride length Gait velocity: decreased   General Gait Details: cues for step length symmetry, L heel strike/toe  off, and safe use of AD   Stairs             Wheelchair Mobility    Modified Rankin (Stroke Patients Only)       Balance Overall balance assessment: Needs assistance Sitting-balance support: Feet supported;Single extremity supported Sitting balance-Leahy Scale: Good     Standing balance support: Bilateral upper extremity supported Standing balance-Leahy Scale: Poor                              Cognition Arousal/Alertness: Awake/alert Behavior During Therapy: WFL for tasks assessed/performed Overall Cognitive Status: Within Functional Limits for tasks assessed                                        Exercises      General Comments        Pertinent Vitals/Pain Pain Assessment: Faces Faces Pain Scale: Hurts little more Pain Location: L hip Pain Descriptors / Indicators: Guarding;Sore Pain Intervention(s): Monitored during session;Repositioned    Home Living                      Prior Function            PT Goals (current goals can now be found in the care plan section) Progress towards PT goals: Progressing toward goals    Frequency    7X/week      PT Plan Current plan remains appropriate    Co-evaluation              AM-PAC PT "6 Clicks" Daily  Activity  Outcome Measure  Difficulty turning over in bed (including adjusting bedclothes, sheets and blankets)?: A Lot Difficulty moving from lying on back to sitting on the side of the bed? : A Lot Difficulty sitting down on and standing up from a chair with arms (e.g., wheelchair, bedside commode, etc,.)?: Unable Help needed moving to and from a bed to chair (including a wheelchair)?: A Little Help needed walking in hospital room?: A Little Help needed climbing 3-5 steps with a railing? : A Little 6 Click Score: 14    End of Session Equipment Utilized During Treatment: Gait belt Activity Tolerance: Patient tolerated treatment well Patient left: in  chair;with call bell/phone within reach;with family/visitor present Nurse Communication: Mobility status PT Visit Diagnosis: Other abnormalities of gait and mobility (R26.89);Pain Pain - Right/Left: Left Pain - part of body: Hip     Time: 1610-9604 PT Time Calculation (min) (ACUTE ONLY): 16 min  Charges:  $Gait Training: 8-22 mins                     Erline Levine, PTA Acute Rehabilitation Services Pager: [phone] Office: [phone]     Carolynne Edouard 06/17/2018, 1:33 PM

## 2018-06-18 LAB — TYPE AND SCREEN
ABO/RH(D): O POS
Antibody Screen: NEGATIVE
Unit division: 0
Unit division: 0

## 2018-06-18 LAB — ABO/RH: ABO/RH(D): O POS

## 2018-06-18 LAB — BPAM RBC
Blood Product Expiration Date: 201911102359
Blood Product Expiration Date: 201911102359
ISSUE DATE / TIME: 201910112059
ISSUE DATE / TIME: 201910112351
Unit Type and Rh: 5100
Unit Type and Rh: 5100

## 2018-06-18 LAB — HEMOGLOBIN AND HEMATOCRIT, BLOOD
HCT: 30.1 % — ABNORMAL LOW (ref 36.0–46.0)
Hemoglobin: 9.8 g/dL — ABNORMAL LOW (ref 12.0–15.0)

## 2018-06-18 NOTE — Plan of Care (Signed)

## 2018-06-18 NOTE — Plan of Care (Signed)

## 2018-06-18 NOTE — Progress Notes (Signed)
Physical Therapy Treatment Patient Details Name: Nicole Morris MRN: [id number] DOB: [birth year] Today's Date: 06/18/2018    History of Present Illness Admitted for L THA, Anterior Approach;  has a past medical history of Anxiety, Asthma, Chronic lower back pain, Dysrhythmia,  Hypothyroidism, Kidney disease, chronic, stage II (GFR 60-89 ml/min), Migraine, Neuromuscular disorder (HCC), PONV (postoperative nausea and vomiting), and Scoliosis.    PT Comments    Ambulating safely with RW and able to navigate steps with husband to block RW. Feels confident about gait, stairs, exercises. Mild dizziness upon sitting up however BP stable this afternoon. Focused on symmetry of gait which is coming along nicely. Adequate for d/c from PT standpoint when medically stable. Will follow until discharged.   Follow Up Recommendations  Follow surgeon's recommendation for DC plan and follow-up therapies;Supervision/Assistance - 24 hour     Equipment Recommendations  Rolling walker with 5" wheels;3in1 (PT)    Recommendations for Other Services       Precautions / Restrictions Precautions Precautions: Fall Precaution Comments: watch BP  Restrictions Weight Bearing Restrictions: Yes LLE Weight Bearing: Weight bearing as tolerated    Mobility  Bed Mobility Overal bed mobility: Modified Independent Bed Mobility: Supine to Sit     Supine to sit: Modified independent (Device/Increase time)     General bed mobility comments: extra time, performed to Rt side of bed  Transfers Overall transfer level: Needs assistance Equipment used: Rolling walker (2 wheeled) Transfers: Sit to/from Stand Sit to Stand: Supervision         General transfer comment: Slow to rise but able without physical assist. Cues for technique.  Ambulation/Gait Ambulation/Gait assistance: Supervision   Assistive device: Rolling walker (2 wheeled) Gait Pattern/deviations: Step-through pattern;Decreased dorsiflexion -  left;Decreased weight shift to left;Decreased stance time - left;Decreased stride length Gait velocity: decreased   General Gait Details: VC for gait symmetry and Lt heel strike. Step through gait pattern intermittently emerging. Supervision for safety.    Stairs   Stairs assistance: Min assist Stair Management: No rails;Step to pattern;Backwards;With walker   General stair comments: Husband assisted, pt able to verbalize and demo correct technique.   Wheelchair Mobility    Modified Rankin (Stroke Patients Only)       Balance Overall balance assessment: Needs assistance Sitting-balance support: Feet supported;Single extremity supported Sitting balance-Leahy Scale: Good     Standing balance support: Bilateral upper extremity supported Standing balance-Leahy Scale: Poor                              Cognition Arousal/Alertness: Awake/alert Behavior During Therapy: WFL for tasks assessed/performed Overall Cognitive Status: Within Functional Limits for tasks assessed                                        Exercises      General Comments General comments (skin integrity, edema, etc.): BP stable although felt a little light headed when she sat up.      Pertinent Vitals/Pain Pain Assessment: Faces Faces Pain Scale: Hurts little more Pain Location: L hip Pain Descriptors / Indicators: Guarding;Sore Pain Intervention(s): Monitored during session    Home Living                      Prior Function            PT Goals (current  goals can now be found in the care plan section) Acute Rehab PT Goals PT Goal Formulation: With patient Time For Goal Achievement: 06/22/18 Potential to Achieve Goals: Good Progress towards PT goals: Progressing toward goals    Frequency    7X/week      PT Plan Current plan remains appropriate    Co-evaluation              AM-PAC PT "6 Clicks" Daily Activity  Outcome Measure   Difficulty turning over in bed (including adjusting bedclothes, sheets and blankets)?: A Little Difficulty moving from lying on back to sitting on the side of the bed? : A Little Difficulty sitting down on and standing up from a chair with arms (e.g., wheelchair, bedside commode, etc,.)?: A Little Help needed moving to and from a bed to chair (including a wheelchair)?: None Help needed walking in hospital room?: None Help needed climbing 3-5 steps with a railing? : A Little 6 Click Score: 20    End of Session Equipment Utilized During Treatment: Gait belt Activity Tolerance: Patient tolerated treatment well Patient left: in chair;with call bell/phone within reach Nurse Communication: Mobility status PT Visit Diagnosis: Other abnormalities of gait and mobility (R26.89);Pain Pain - Right/Left: Left Pain - part of body: Hip     Time: 1610-9604 PT Time Calculation (min) (ACUTE ONLY): 13 min  Charges:  $Gait Training: 8-22 mins                     Charlsie Merles, PT, DPT   Berton Mount 06/18/2018, 5:24 PM

## 2018-06-18 NOTE — Progress Notes (Signed)
Subjective: Patient stable.  Feels better today.  Had 2 units packed red blood cells transfused yesterday.   Objective: Vital signs in last 24 hours: Temp:  [97.7 F (36.5 C)-100.2 F (37.9 C)] 98.3 F (36.8 C) (10/12 0500) Pulse Rate:  [80-99] 80 (10/12 0500) Resp:  [14-20] 15 (10/12 0500) BP: (97-108)/(66-77) 106/76 (10/12 0500) SpO2:  [94 %-100 %] 98 % (10/12 0500)  Intake/Output from previous day: 10/11 0701 - 10/12 0700 In: 1350 [P.O.:720; Blood:630] Out: -  Intake/Output this shift: No intake/output data recorded.  Exam:  Dorsiflexion/Plantar flexion intact  Labs: Recent Labs    06/16/18 0545 06/17/18 0931 06/18/18 0758  HGB 8.7* 7.5* 9.8*   Recent Labs    06/16/18 0545 06/17/18 0931 06/18/18 0758  WBC 6.2 4.7  --   RBC 2.94* 2.44*  --   HCT 28.6* 23.7* 30.1*  PLT 145* 131*  --    No results for input(s): NA, K, CL, CO2, BUN, CREATININE, GLUCOSE, CALCIUM in the last 72 hours. No results for input(s): LABPT, INR in the last 72 hours.  Assessment/Plan: Plan is to continue to mobilize.  Check hemoglobin tomorrow.   Nicole Morris 06/18/2018, 9:28 AM

## 2018-06-19 LAB — CBC
HCT: 29 % — ABNORMAL LOW (ref 36.0–46.0)
Hemoglobin: 9.4 g/dL — ABNORMAL LOW (ref 12.0–15.0)
MCH: 29.6 pg (ref 26.0–34.0)
MCHC: 32.4 g/dL (ref 30.0–36.0)
MCV: 91.2 fL (ref 80.0–100.0)
Platelets: 182 10*3/uL (ref 150–400)
RBC: 3.18 MIL/uL — ABNORMAL LOW (ref 3.87–5.11)
RDW: 14.4 % (ref 11.5–15.5)
WBC: 4.2 10*3/uL (ref 4.0–10.5)
nRBC: 0 % (ref 0.0–0.2)

## 2018-06-19 NOTE — Progress Notes (Signed)
Physical Therapy Treatment Patient Details Name: Nicole Morris MRN: [id number] DOB: [birth year] Today's Date: 06/19/2018    History of Present Illness Admitted for L THA, Anterior Approach;  has a past medical history of Anxiety, Asthma, Chronic lower back pain, Dysrhythmia,  Hypothyroidism, Kidney disease, chronic, stage II (GFR 60-89 ml/min), Migraine, Neuromuscular disorder (HCC), PONV (postoperative nausea and vomiting), and Scoliosis.    PT Comments    Continuing work on functional mobility and activity tolerance;  Discussed HEP, car transfers, stairs, keeping activity level at dc home; Questions solicited and answered; OK for dc home from PT standpoint    Follow Up Recommendations  Follow surgeon's recommendation for DC plan and follow-up therapies;Supervision/Assistance - 24 hour     Equipment Recommendations  Rolling walker with 5" wheels;3in1 (PT)    Recommendations for Other Services       Precautions / Restrictions Precautions Precautions: Fall Precaution Comments: watch BP  Restrictions Weight Bearing Restrictions: Yes LLE Weight Bearing: Weight bearing as tolerated    Mobility  Bed Mobility                  Transfers Overall transfer level: Needs assistance Equipment used: Rolling walker (2 wheeled) Transfers: Sit to/from Stand Sit to Stand: Supervision         General transfer comment: Slow to rise but able without physical assist. less need for cues  Ambulation/Gait Ambulation/Gait assistance: Supervision Gait Distance (Feet): 25 Feet(to and from bathroom) Assistive device: Rolling walker (2 wheeled) Gait Pattern/deviations: Step-through pattern;Decreased dorsiflexion - left;Decreased weight shift to left;Decreased stance time - left;Decreased stride length Gait velocity: decreased   General Gait Details: Less distance as she is discharging this afternoon; no loss of balance or dizziness noted; good use of RW   Stairs          General stair comments: no questions about stairs   Wheelchair Mobility    Modified Rankin (Stroke Patients Only)       Balance     Sitting balance-Leahy Scale: Good       Standing balance-Leahy Scale: Fair                              Cognition Arousal/Alertness: Awake/alert Behavior During Therapy: WFL for tasks assessed/performed Overall Cognitive Status: Within Functional Limits for tasks assessed                                        Exercises      General Comments General comments (skin integrity, edema, etc.): Assisted pt minimally in getting dressed to go home; provided another HEP for her review      Pertinent Vitals/Pain Pain Assessment: Faces Faces Pain Scale: Hurts a little bit Pain Location: L hip Pain Descriptors / Indicators: Guarding;Sore Pain Intervention(s): Monitored during session    Home Living                      Prior Function            PT Goals (current goals can now be found in the care plan section) Acute Rehab PT Goals Patient Stated Goal: Back to exercise PT Goal Formulation: With patient Time For Goal Achievement: 06/22/18 Potential to Achieve Goals: Good Progress towards PT goals: Progressing toward goals    Frequency    7X/week  PT Plan Current plan remains appropriate    Co-evaluation              AM-PAC PT "6 Clicks" Daily Activity  Outcome Measure  Difficulty turning over in bed (including adjusting bedclothes, sheets and blankets)?: A Little Difficulty moving from lying on back to sitting on the side of the bed? : A Little Difficulty sitting down on and standing up from a chair with arms (e.g., wheelchair, bedside commode, etc,.)?: A Little Help needed moving to and from a bed to chair (including a wheelchair)?: None Help needed walking in hospital room?: None Help needed climbing 3-5 steps with a railing? : A Little 6 Click Score: 20    End of Session    Activity Tolerance: Patient tolerated treatment well Patient left: in chair;with call bell/phone within reach;with family/visitor present Nurse Communication: Mobility status PT Visit Diagnosis: Other abnormalities of gait and mobility (R26.89);Pain Pain - Right/Left: Left Pain - part of body: Hip     Time: 1339-1401 PT Time Calculation (min) (ACUTE ONLY): 22 min  Charges:  $Therapeutic Activity: 8-22 mins                     Van Clines, PT  Acute Rehabilitation Services Pager [phone] Office [phone]    [name] 06/19/2018, 2:11 PM

## 2018-06-19 NOTE — Progress Notes (Signed)
Patient discharging home today. Discharge instructions explained to patient and she verbalized understanding. Took all personal belongings. No further questons or concerns voiced.

## 2018-06-19 NOTE — Progress Notes (Signed)
Physical Therapy Treatment Patient Details Name: Nicole Morris MRN: [id number] DOB: March 10, 1961 Today's Date: 06/19/2018    History of Present Illness Admitted for L THA, Anterior Approach;  has a past medical history of Anxiety, Asthma, Chronic lower back pain, Dysrhythmia,  Hypothyroidism, Kidney disease, chronic, stage II (GFR 60-89 ml/min), Migraine, Neuromuscular disorder (HCC), PONV (postoperative nausea and vomiting), and Scoliosis.    PT Comments    Continuing work on functional mobility and activity tolerance;  Able to walk hallways, practice tow different techniques for going up and down stairs; Plan for second session today to answer questions and go over exercises; On track for dc home later today   Follow Up Recommendations  Follow surgeon's recommendation for DC plan and follow-up therapies;Supervision/Assistance - 24 hour     Equipment Recommendations  Rolling walker with 5" wheels;3in1 (PT)    Recommendations for Other Services       Precautions / Restrictions Precautions Precautions: Fall Restrictions LLE Weight Bearing: Weight bearing as tolerated    Mobility  Bed Mobility Overal bed mobility: Modified Independent       Supine to sit: Modified independent (Device/Increase time)     General bed mobility comments: extra time, performed to Rt side of bed  Transfers Overall transfer level: Needs assistance Equipment used: Rolling walker (2 wheeled) Transfers: Sit to/from Stand Sit to Stand: Supervision         General transfer comment: Slow to rise but able without physical assist. Cues for technique.  Ambulation/Gait Ambulation/Gait assistance: Supervision Gait Distance (Feet): 200 Feet Assistive device: Rolling walker (2 wheeled) Gait Pattern/deviations: Step-through pattern;Decreased dorsiflexion - left;Decreased weight shift to left;Decreased stance time - left;Decreased stride length Gait velocity: decreased   General Gait Details: VC for  gait symmetry and Lt heel strike, as well as hand positioning on RW; Step through gait pattern intermittently emerging. Supervision for safety.    Stairs Stairs: Yes Stairs assistance: Min assist;Min guard Stair Management: No rails;Backwards;One rail Left;Sideways Number of Stairs: 12(also reviewed backwards technique) General stair comments: Pt able to instruct son on how to correctly assist and stabilize RW with going up backwards; then went up and down flight of steps with L rail; overall managing quite well   Wheelchair Mobility    Modified Rankin (Stroke Patients Only)       Balance     Sitting balance-Leahy Scale: Good       Standing balance-Leahy Scale: Fair                              Cognition Arousal/Alertness: Awake/alert Behavior During Therapy: WFL for tasks assessed/performed Overall Cognitive Status: Within Functional Limits for tasks assessed                                        Exercises      General Comments General comments (skin integrity, edema, etc.): Minimal dizziness with activity      Pertinent Vitals/Pain Pain Assessment: 0-10 Pain Score: 5  Pain Location: L hip Pain Descriptors / Indicators: Guarding;Sore Pain Intervention(s): Monitored during session;Patient requesting pain meds-RN notified    Home Living                      Prior Function            PT Goals (current goals can now be  found in the care plan section) Acute Rehab PT Goals Patient Stated Goal: Back to exercise PT Goal Formulation: With patient Time For Goal Achievement: 06/22/18 Potential to Achieve Goals: Good Progress towards PT goals: Progressing toward goals(EXCELLENT progress compare to PT eval)    Frequency    7X/week      PT Plan Current plan remains appropriate    Co-evaluation              AM-PAC PT "6 Clicks" Daily Activity  Outcome Measure  Difficulty turning over in bed (including  adjusting bedclothes, sheets and blankets)?: A Little Difficulty moving from lying on back to sitting on the side of the bed? : A Little Difficulty sitting down on and standing up from a chair with arms (e.g., wheelchair, bedside commode, etc,.)?: A Little Help needed moving to and from a bed to chair (including a wheelchair)?: None Help needed walking in hospital room?: None Help needed climbing 3-5 steps with a railing? : A Little 6 Click Score: 20    End of Session Equipment Utilized During Treatment: Gait belt Activity Tolerance: Patient tolerated treatment well Patient left: in chair;with call bell/phone within reach;with family/visitor present Nurse Communication: Mobility status PT Visit Diagnosis: Other abnormalities of gait and mobility (R26.89);Pain Pain - Right/Left: Left Pain - part of body: Hip     Time: 1610-9604 PT Time Calculation (min) (ACUTE ONLY): 36 min  Charges:  $Gait Training: 23-37 mins                     Van Clines, Ethel  Acute Rehabilitation Services Pager [phone] Office [phone]    [name] 06/19/2018, 9:26 AM

## 2018-06-19 NOTE — Progress Notes (Signed)
Subjective: Patient stable.  Feels very good this morning.  Ready for discharge to home.   Objective: Vital signs in last 24 hours: Temp:  [98 F (36.7 C)-99.9 F (37.7 C)] 98.4 F (36.9 C) (10/13 0409) Pulse Rate:  [80-87] 80 (10/13 0409) Resp:  [16-20] 16 (10/13 0409) BP: (100-108)/(77-78) 108/77 (10/13 0409) SpO2:  [99 %-100 %] 99 % (10/13 0409)  Intake/Output from previous day: 10/12 0701 - 10/13 0700 In: 120 [P.O.:120] Out: -  Intake/Output this shift: Total I/O In: 240 [P.O.:240] Out: -   Exam:  Dorsiflexion/Plantar flexion intact  Labs: Recent Labs    06/17/18 0931 06/18/18 0758 06/19/18 0452  HGB 7.5* 9.8* 9.4*   Recent Labs    06/17/18 0931 06/18/18 0758 06/19/18 0452  WBC 4.7  --  4.2  RBC 2.44*  --  3.18*  HCT 23.7* 30.1* 29.0*  PLT 131*  --  182   No results for input(s): NA, K, CL, CO2, BUN, CREATININE, GLUCOSE, CALCIUM in the last 72 hours. No results for input(s): LABPT, INR in the last 72 hours.  Assessment/Plan: Plan discharge today.  Hemoglobin 9.4.  Patient feels ready.   G Scott Kataleah Bejar 06/19/2018, 10:15 AM

## 2018-06-19 NOTE — Plan of Care (Signed)
  Problem: Activity: Goal: Risk for activity intolerance will decrease Outcome: Progressing   Problem: Pain Managment: Goal: General experience of comfort will improve Outcome: Progressing   Problem: Safety: Goal: Ability to remain free from injury will improve Outcome: Progressing   Problem: Activity: Goal: Risk for activity intolerance will decrease Outcome: Progressing

## 2018-06-21 DIAGNOSIS — Z9181 History of falling: Secondary | ICD-10-CM | POA: Diagnosis not present

## 2018-06-21 DIAGNOSIS — Z471 Aftercare following joint replacement surgery: Secondary | ICD-10-CM | POA: Diagnosis not present

## 2018-06-21 DIAGNOSIS — G629 Polyneuropathy, unspecified: Secondary | ICD-10-CM | POA: Diagnosis not present

## 2018-06-21 DIAGNOSIS — N182 Chronic kidney disease, stage 2 (mild): Secondary | ICD-10-CM | POA: Diagnosis not present

## 2018-06-21 DIAGNOSIS — Z7982 Long term (current) use of aspirin: Secondary | ICD-10-CM | POA: Diagnosis not present

## 2018-06-21 DIAGNOSIS — J45909 Unspecified asthma, uncomplicated: Secondary | ICD-10-CM | POA: Diagnosis not present

## 2018-06-21 DIAGNOSIS — M2170 Unequal limb length (acquired), unspecified site: Secondary | ICD-10-CM | POA: Diagnosis not present

## 2018-06-21 DIAGNOSIS — M4186 Other forms of scoliosis, lumbar region: Secondary | ICD-10-CM | POA: Diagnosis not present

## 2018-06-21 DIAGNOSIS — E039 Hypothyroidism, unspecified: Secondary | ICD-10-CM | POA: Diagnosis not present

## 2018-06-21 DIAGNOSIS — N319 Neuromuscular dysfunction of bladder, unspecified: Secondary | ICD-10-CM | POA: Diagnosis not present

## 2018-06-21 DIAGNOSIS — Z96642 Presence of left artificial hip joint: Secondary | ICD-10-CM | POA: Diagnosis not present

## 2018-06-21 NOTE — Discharge Summary (Signed)
Patient ID: Nicole Morris MRN: [id number] DOB/AGE: [birth year] 57 y.o.  Admit date: 06/14/2018 Discharge date: 06/21/2018  Admission Diagnoses:  Principal Problem:   Unilateral primary osteoarthritis, left hip Active Problems:   Status post total replacement of left hip   Discharge Diagnoses:  Status post total replacement of left hip Post-op anemia secondary to surgery  Past Medical History:  Diagnosis Date  . Anxiety   . Asthma   . Chronic lower back pain   . Dysrhythmia    pt got Tachy last time under anesthesia  . High cholesterol   . History of blood transfusion    "related to surgeries" (06/14/2018)  . Hypothyroidism   . Kidney disease, chronic, stage II (GFR 60-89 ml/min)    "one of my kidneys is in my abdomen" (06/14/2018)  . Migraine    "daily-weekly" (06/14/2018)  . Neuromuscular disorder (HCC)    neuropathy in legs from back surgery  . PONV (postoperative nausea and vomiting)   . Scoliosis     Surgeries: Procedure(s): LEFT TOTAL HIP ARTHROPLASTY ANTERIOR APPROACH on 06/14/2018   Consultants:   Discharged Condition: Improved  Hospital Course: Nicole Morris is an 57 y.o. female who was admitted 06/14/2018 for operative treatment ofUnilateral primary osteoarthritis, left hip. Patient has severe unremitting pain that affects sleep, daily activities, and work/hobbies. After pre-op clearance the patient was taken to the operating room on 06/14/2018 and underwent  Procedure(s): LEFT TOTAL HIP ARTHROPLASTY ANTERIOR APPROACH.    Patient was given perioperative antibiotics:  Anti-infectives (From admission, onward)   Start     Dose/Rate Route Frequency Ordered Stop   06/14/18 1800  ceFAZolin (ANCEF) IVPB 1 g/50 mL premix     1 g 100 mL/hr over 30 Minutes Intravenous Every 6 hours 06/14/18 1622 06/14/18 2359   06/14/18 1030  ceFAZolin (ANCEF) IVPB 2g/100 mL premix     2 g 200 mL/hr over 30 Minutes Intravenous On call to O.R. 06/14/18 1021 06/14/18 1227   06/14/18 1026  ceFAZolin (ANCEF) 2-4 GM/100ML-% IVPB    Note to Pharmacy:  Aquilla Hacker   : cabinet override      06/14/18 1026 06/14/18 1227       Patient was given sequential compression devices, early ambulation, and chemoprophylaxis to prevent DVT.  Patient benefited maximally from hospital stay and required a blood transfusion due to post op anmeia.     Recent vital signs: No data found.   Recent laboratory studies:  Recent Labs    06/19/18 0452  WBC 4.2  HGB 9.4*  HCT 29.0*  PLT 182     Discharge Medications:   Allergies as of 06/19/2018      Reactions   Codeine Nausea Only   Mushroom Extract Complex Nausea Only   Nausea       Medication List    TAKE these medications   albuterol 108 (90 Base) MCG/ACT inhaler Commonly known as:  PROVENTIL HFA;VENTOLIN HFA Inhale 2 puffs into the lungs every 6 (six) hours as needed for wheezing or shortness of breath.   aspirin 81 MG chewable tablet Chew 1 tablet (81 mg total) by mouth 2 (two) times daily.   buPROPion 300 MG 24 hr tablet Commonly known as:  WELLBUTRIN XL Take 300 mg by mouth daily.   butalbital-acetaminophen-caffeine 50-325-40 MG tablet Commonly known as:  FIORICET, ESGIC Take 1 tablet by mouth daily as needed for headache.   calcium-vitamin D 500-200 MG-UNIT tablet Commonly known as:  OSCAL WITH D Take 1 tablet by mouth  daily with breakfast.   cyclobenzaprine 5 MG tablet Commonly known as:  FLEXERIL Take 5 mg by mouth 3 (three) times daily as needed for muscle spasms.   diclofenac sodium 1 % Gel Commonly known as:  VOLTAREN Apply 2 g topically daily as needed (joint pain).   EPINEPHrine 0.3 mg/0.3 mL Soaj injection Commonly known as:  EPI-PEN Inject 0.3 mLs (0.3 mg total) into the muscle as needed.   gabapentin 300 MG capsule Commonly known as:  NEURONTIN Take 300 mg by mouth at bedtime.   levothyroxine 50 MCG tablet Commonly known as:  SYNTHROID, LEVOTHROID Take 50 mcg by mouth daily  before breakfast.   LORazepam 0.5 MG tablet Commonly known as:  ATIVAN Take 0.25-0.5 mg by mouth every 8 (eight) hours as needed for anxiety.   NUCYNTA ER 50 MG 12 hr tablet Generic drug:  tapentadol Take 50 mg by mouth daily.   ondansetron 4 MG disintegrating tablet Commonly known as:  ZOFRAN-ODT Take 1 tablet (4 mg total) by mouth every 8 (eight) hours as needed for nausea or vomiting.   oxyCODONE 5 MG immediate release tablet Commonly known as:  Oxy IR/ROXICODONE Take 1-2 tablets (5-10 mg total) by mouth every 4 (four) hours as needed for moderate pain (pain score 4-6).   rosuvastatin 5 MG tablet Commonly known as:  CRESTOR Take 5 mg by mouth daily.   Vitamin D (Ergocalciferol) 50000 units Caps capsule Commonly known as:  DRISDOL TAKE ONE CAPSULE BY MOUTH EVERY 7 DAYS What changed:  See the new instructions.   ZOLOFT 100 MG tablet Generic drug:  sertraline Take 100 mg by mouth every evening.       Diagnostic Studies: Dg Pelvis Portable  Result Date: 06/14/2018 CLINICAL DATA:  57 year old female post left hip arthroplasty for arthritis. Subsequent encounter. EXAM: PORTABLE PELVIS 1-2 VIEWS COMPARISON:  Intraoperative films 06/14/2018. FINDINGS: Post total left hip replacement which appears in satisfactory position on frontal projection. Acetabular aspect immediately beneath cortical margin of the pelvic inlet. Postsurgical changes lower lumbar spine and sacrum. IMPRESSION: Post total left hip replacement. Electronically Signed   By: Lacy Duverney M.D.   On: 06/14/2018 15:25   Dg C-arm 1-60 Min  Result Date: 06/14/2018 CLINICAL DATA:  Total left hip arthroplasty. EXAM: OPERATIVE LEFT HIP (WITH PELVIS IF PERFORMED) TECHNIQUE: Fluoroscopic spot image(s) were submitted for interpretation post-operatively. COMPARISON:  CT of the pelvis 03/16/2018 FINDINGS: Intraoperative images from total left hip arthroplasty demonstrate normal alignment of the orthopedic hardware without  evidence of fracture or other immediate complications. Total fluoroscopy time is reported as 33.2 seconds. IMPRESSION: Intraoperative images from left hip arthroplasty without evidence of immediate complications. Electronically Signed   By: Ted Mcalpine M.D.   On: 06/14/2018 14:47   Dg C-arm 1-60 Min  Result Date: 06/14/2018 CLINICAL DATA:  Total left hip arthroplasty. EXAM: OPERATIVE LEFT HIP (WITH PELVIS IF PERFORMED) TECHNIQUE: Fluoroscopic spot image(s) were submitted for interpretation post-operatively. COMPARISON:  CT of the pelvis 03/16/2018 FINDINGS: Intraoperative images from total left hip arthroplasty demonstrate normal alignment of the orthopedic hardware without evidence of fracture or other immediate complications. Total fluoroscopy time is reported as 33.2 seconds. IMPRESSION: Intraoperative images from left hip arthroplasty without evidence of immediate complications. Electronically Signed   By: Ted Mcalpine M.D.   On: 06/14/2018 14:47   Dg Hip Operative Unilat W Or W/o Pelvis Left  Result Date: 06/14/2018 CLINICAL DATA:  Total left hip arthroplasty. EXAM: OPERATIVE LEFT HIP (WITH PELVIS IF PERFORMED) TECHNIQUE: Fluoroscopic  spot image(s) were submitted for interpretation post-operatively. COMPARISON:  CT of the pelvis 03/16/2018 FINDINGS: Intraoperative images from total left hip arthroplasty demonstrate normal alignment of the orthopedic hardware without evidence of fracture or other immediate complications. Total fluoroscopy time is reported as 33.2 seconds. IMPRESSION: Intraoperative images from left hip arthroplasty without evidence of immediate complications. Electronically Signed   By: Ted Mcalpine M.D.   On: 06/14/2018 14:47    Disposition:   Discharge Instructions    Call MD / Call 911   Complete by:  As directed    If you experience chest pain or shortness of breath, CALL 911 and be transported to the hospital emergency room.  If you develope a fever  above 101 F, pus (white drainage) or increased drainage or redness at the wound, or calf pain, call your surgeon's office.   Constipation Prevention   Complete by:  As directed    Drink plenty of fluids.  Prune juice may be helpful.  You may use a stool softener, such as Colace (over the counter) 100 mg twice a day.  Use MiraLax (over the counter) for constipation as needed.   Diet - low sodium heart healthy   Complete by:  As directed    Increase activity slowly as tolerated   Complete by:  As directed       Follow-up Information    Kathryne Hitch, MD Follow up in 2 week(s).   Specialty:  Orthopedic Surgery Contact information: [address] [place] [address] [phone]        [facility] Follow up.   Specialty:  Home Health Services Why:  A representative from [facility] will contact you to arrange start date and time for your therapy. Contact information: [address] [place] [address] [place] [address] [phone]            Signed: Richardean Canal 06/21/2018, 2:31 PM

## 2018-06-28 ENCOUNTER — Encounter (INDEPENDENT_AMBULATORY_CARE_PROVIDER_SITE_OTHER): Payer: Self-pay | Admitting: Orthopaedic Surgery

## 2018-06-28 ENCOUNTER — Ambulatory Visit (INDEPENDENT_AMBULATORY_CARE_PROVIDER_SITE_OTHER): Payer: BLUE CROSS/BLUE SHIELD | Admitting: Orthopaedic Surgery

## 2018-06-28 DIAGNOSIS — E039 Hypothyroidism, unspecified: Secondary | ICD-10-CM | POA: Diagnosis not present

## 2018-06-28 DIAGNOSIS — Z9181 History of falling: Secondary | ICD-10-CM | POA: Diagnosis not present

## 2018-06-28 DIAGNOSIS — N319 Neuromuscular dysfunction of bladder, unspecified: Secondary | ICD-10-CM | POA: Diagnosis not present

## 2018-06-28 DIAGNOSIS — M2170 Unequal limb length (acquired), unspecified site: Secondary | ICD-10-CM | POA: Diagnosis not present

## 2018-06-28 DIAGNOSIS — Z96642 Presence of left artificial hip joint: Secondary | ICD-10-CM | POA: Diagnosis not present

## 2018-06-28 DIAGNOSIS — Z471 Aftercare following joint replacement surgery: Secondary | ICD-10-CM | POA: Diagnosis not present

## 2018-06-28 DIAGNOSIS — M4186 Other forms of scoliosis, lumbar region: Secondary | ICD-10-CM | POA: Diagnosis not present

## 2018-06-28 DIAGNOSIS — Z7982 Long term (current) use of aspirin: Secondary | ICD-10-CM | POA: Diagnosis not present

## 2018-06-28 DIAGNOSIS — J45909 Unspecified asthma, uncomplicated: Secondary | ICD-10-CM | POA: Diagnosis not present

## 2018-06-28 DIAGNOSIS — G629 Polyneuropathy, unspecified: Secondary | ICD-10-CM | POA: Diagnosis not present

## 2018-06-28 DIAGNOSIS — N182 Chronic kidney disease, stage 2 (mild): Secondary | ICD-10-CM | POA: Diagnosis not present

## 2018-06-28 NOTE — Progress Notes (Signed)
The patient is 2 weeks status post a left total hip arthroplasty.  She has hip is doing well but she is flared up her back and strained her back.  She has been a patient of [facility] in the past and would like to go back to their work on her back.  She is on oxycodone but only once a day she does not need refills of that.  She does take Flexeril as well.  She says she is doing well from that standpoint.  She has been on aspirin twice a day.  On exam her left hip incision looks good.  I did remove her old Steri-Strips in place new Steri-Strips.  Her leg lengths appear equal.  She tolerates me putting her hip the range of motion.  There is a mild hematoma but no significant seroma.  At this point we will send her to outpatient physical therapy to work on her back and not her hip.  All question concerns were answered and addressed.  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  We will see her back in 4 weeks to see how she is doing overall but no x-rays are needed.  She is to call if she needs any refill on medications.

## 2018-06-30 DIAGNOSIS — G629 Polyneuropathy, unspecified: Secondary | ICD-10-CM | POA: Diagnosis not present

## 2018-06-30 DIAGNOSIS — Z471 Aftercare following joint replacement surgery: Secondary | ICD-10-CM | POA: Diagnosis not present

## 2018-06-30 DIAGNOSIS — E039 Hypothyroidism, unspecified: Secondary | ICD-10-CM | POA: Diagnosis not present

## 2018-06-30 DIAGNOSIS — J45909 Unspecified asthma, uncomplicated: Secondary | ICD-10-CM | POA: Diagnosis not present

## 2018-06-30 DIAGNOSIS — M2170 Unequal limb length (acquired), unspecified site: Secondary | ICD-10-CM | POA: Diagnosis not present

## 2018-06-30 DIAGNOSIS — Z96642 Presence of left artificial hip joint: Secondary | ICD-10-CM | POA: Diagnosis not present

## 2018-06-30 DIAGNOSIS — Z7982 Long term (current) use of aspirin: Secondary | ICD-10-CM | POA: Diagnosis not present

## 2018-06-30 DIAGNOSIS — M4186 Other forms of scoliosis, lumbar region: Secondary | ICD-10-CM | POA: Diagnosis not present

## 2018-06-30 DIAGNOSIS — N182 Chronic kidney disease, stage 2 (mild): Secondary | ICD-10-CM | POA: Diagnosis not present

## 2018-06-30 DIAGNOSIS — N319 Neuromuscular dysfunction of bladder, unspecified: Secondary | ICD-10-CM | POA: Diagnosis not present

## 2018-06-30 DIAGNOSIS — Z9181 History of falling: Secondary | ICD-10-CM | POA: Diagnosis not present

## 2018-07-06 DIAGNOSIS — Z23 Encounter for immunization: Secondary | ICD-10-CM | POA: Diagnosis not present

## 2018-07-06 DIAGNOSIS — R8281 Pyuria: Secondary | ICD-10-CM | POA: Diagnosis not present

## 2018-07-06 DIAGNOSIS — N183 Chronic kidney disease, stage 3 (moderate): Secondary | ICD-10-CM | POA: Diagnosis not present

## 2018-07-07 DIAGNOSIS — M25552 Pain in left hip: Secondary | ICD-10-CM | POA: Diagnosis not present

## 2018-07-07 DIAGNOSIS — M542 Cervicalgia: Secondary | ICD-10-CM | POA: Diagnosis not present

## 2018-07-07 DIAGNOSIS — M4802 Spinal stenosis, cervical region: Secondary | ICD-10-CM | POA: Diagnosis not present

## 2018-07-11 ENCOUNTER — Other Ambulatory Visit (INDEPENDENT_AMBULATORY_CARE_PROVIDER_SITE_OTHER): Payer: Self-pay | Admitting: Specialist

## 2018-07-11 NOTE — Telephone Encounter (Signed)
Vitamin D Refill request

## 2018-07-13 DIAGNOSIS — M4802 Spinal stenosis, cervical region: Secondary | ICD-10-CM | POA: Diagnosis not present

## 2018-07-13 DIAGNOSIS — M542 Cervicalgia: Secondary | ICD-10-CM | POA: Diagnosis not present

## 2018-07-13 DIAGNOSIS — M25552 Pain in left hip: Secondary | ICD-10-CM | POA: Diagnosis not present

## 2018-07-18 DIAGNOSIS — M961 Postlaminectomy syndrome, not elsewhere classified: Secondary | ICD-10-CM | POA: Diagnosis not present

## 2018-07-18 DIAGNOSIS — M5481 Occipital neuralgia: Secondary | ICD-10-CM | POA: Diagnosis not present

## 2018-07-18 DIAGNOSIS — M25552 Pain in left hip: Secondary | ICD-10-CM | POA: Diagnosis not present

## 2018-07-18 DIAGNOSIS — M47812 Spondylosis without myelopathy or radiculopathy, cervical region: Secondary | ICD-10-CM | POA: Diagnosis not present

## 2018-07-26 ENCOUNTER — Ambulatory Visit (INDEPENDENT_AMBULATORY_CARE_PROVIDER_SITE_OTHER): Payer: BLUE CROSS/BLUE SHIELD | Admitting: Orthopaedic Surgery

## 2018-07-26 ENCOUNTER — Encounter (INDEPENDENT_AMBULATORY_CARE_PROVIDER_SITE_OTHER): Payer: Self-pay | Admitting: Orthopaedic Surgery

## 2018-07-26 DIAGNOSIS — Z96642 Presence of left artificial hip joint: Secondary | ICD-10-CM

## 2018-07-26 NOTE — Progress Notes (Signed)
The patient is now 6 weeks status post a left total hip arthroplasty through direct anterior approach.  She states she is doing well overall and has good range of motion strength and she is satisfied.  She is crossing her legs and getting back to more significant activities.  On exam she tolerates me putting her left operative hip the range of motion.  Her right hip is stiff but has no significant pain issues she states.  At this point she continue increase her activities as comfort allows with no restrictions.  I do not need to see her back now for 6 months.  At that visit I like a standing low AP pelvis and lateral of her left operative hip.

## 2018-08-22 DIAGNOSIS — G894 Chronic pain syndrome: Secondary | ICD-10-CM | POA: Diagnosis not present

## 2018-08-22 DIAGNOSIS — F324 Major depressive disorder, single episode, in partial remission: Secondary | ICD-10-CM | POA: Diagnosis not present

## 2018-08-22 DIAGNOSIS — N183 Chronic kidney disease, stage 3 (moderate): Secondary | ICD-10-CM | POA: Diagnosis not present

## 2018-08-22 DIAGNOSIS — F411 Generalized anxiety disorder: Secondary | ICD-10-CM | POA: Diagnosis not present

## 2018-08-22 DIAGNOSIS — F900 Attention-deficit hyperactivity disorder, predominantly inattentive type: Secondary | ICD-10-CM | POA: Diagnosis not present

## 2018-09-11 DIAGNOSIS — J45909 Unspecified asthma, uncomplicated: Secondary | ICD-10-CM | POA: Diagnosis not present

## 2018-09-11 DIAGNOSIS — J069 Acute upper respiratory infection, unspecified: Secondary | ICD-10-CM | POA: Diagnosis not present

## 2018-09-14 DIAGNOSIS — M961 Postlaminectomy syndrome, not elsewhere classified: Secondary | ICD-10-CM | POA: Diagnosis not present

## 2018-09-14 DIAGNOSIS — M5481 Occipital neuralgia: Secondary | ICD-10-CM | POA: Diagnosis not present

## 2018-09-14 DIAGNOSIS — M47812 Spondylosis without myelopathy or radiculopathy, cervical region: Secondary | ICD-10-CM | POA: Diagnosis not present

## 2018-09-14 DIAGNOSIS — M25552 Pain in left hip: Secondary | ICD-10-CM | POA: Diagnosis not present

## 2018-10-03 DIAGNOSIS — N3000 Acute cystitis without hematuria: Secondary | ICD-10-CM | POA: Diagnosis not present

## 2018-10-03 DIAGNOSIS — J4521 Mild intermittent asthma with (acute) exacerbation: Secondary | ICD-10-CM | POA: Diagnosis not present

## 2018-12-14 DIAGNOSIS — M5481 Occipital neuralgia: Secondary | ICD-10-CM | POA: Diagnosis not present

## 2018-12-14 DIAGNOSIS — M25552 Pain in left hip: Secondary | ICD-10-CM | POA: Diagnosis not present

## 2018-12-14 DIAGNOSIS — M47812 Spondylosis without myelopathy or radiculopathy, cervical region: Secondary | ICD-10-CM | POA: Diagnosis not present

## 2018-12-14 DIAGNOSIS — M961 Postlaminectomy syndrome, not elsewhere classified: Secondary | ICD-10-CM | POA: Diagnosis not present

## 2019-01-24 ENCOUNTER — Ambulatory Visit: Payer: Self-pay | Admitting: Orthopaedic Surgery

## 2019-02-24 DIAGNOSIS — R8290 Unspecified abnormal findings in urine: Secondary | ICD-10-CM | POA: Diagnosis not present

## 2019-02-24 DIAGNOSIS — N3090 Cystitis, unspecified without hematuria: Secondary | ICD-10-CM | POA: Diagnosis not present

## 2019-04-04 DIAGNOSIS — M5481 Occipital neuralgia: Secondary | ICD-10-CM | POA: Diagnosis not present

## 2019-04-04 DIAGNOSIS — M47812 Spondylosis without myelopathy or radiculopathy, cervical region: Secondary | ICD-10-CM | POA: Diagnosis not present

## 2019-04-04 DIAGNOSIS — M961 Postlaminectomy syndrome, not elsewhere classified: Secondary | ICD-10-CM | POA: Diagnosis not present

## 2019-04-04 DIAGNOSIS — M25552 Pain in left hip: Secondary | ICD-10-CM | POA: Diagnosis not present

## 2019-04-11 DIAGNOSIS — E039 Hypothyroidism, unspecified: Secondary | ICD-10-CM | POA: Diagnosis not present

## 2019-04-11 DIAGNOSIS — N183 Chronic kidney disease, stage 3 (moderate): Secondary | ICD-10-CM | POA: Diagnosis not present

## 2019-04-11 DIAGNOSIS — F324 Major depressive disorder, single episode, in partial remission: Secondary | ICD-10-CM | POA: Diagnosis not present

## 2019-04-11 DIAGNOSIS — E785 Hyperlipidemia, unspecified: Secondary | ICD-10-CM | POA: Diagnosis not present

## 2019-04-11 DIAGNOSIS — Z0000 Encounter for general adult medical examination without abnormal findings: Secondary | ICD-10-CM | POA: Diagnosis not present

## 2019-04-20 DIAGNOSIS — L821 Other seborrheic keratosis: Secondary | ICD-10-CM | POA: Diagnosis not present

## 2019-04-20 DIAGNOSIS — L570 Actinic keratosis: Secondary | ICD-10-CM | POA: Diagnosis not present

## 2019-05-19 DIAGNOSIS — M961 Postlaminectomy syndrome, not elsewhere classified: Secondary | ICD-10-CM | POA: Diagnosis not present

## 2019-05-19 DIAGNOSIS — M5481 Occipital neuralgia: Secondary | ICD-10-CM | POA: Diagnosis not present

## 2019-05-19 DIAGNOSIS — R51 Headache: Secondary | ICD-10-CM | POA: Diagnosis not present

## 2019-05-19 DIAGNOSIS — M47812 Spondylosis without myelopathy or radiculopathy, cervical region: Secondary | ICD-10-CM | POA: Diagnosis not present

## 2019-05-19 DIAGNOSIS — M25552 Pain in left hip: Secondary | ICD-10-CM | POA: Diagnosis not present

## 2019-05-26 DIAGNOSIS — M961 Postlaminectomy syndrome, not elsewhere classified: Secondary | ICD-10-CM | POA: Diagnosis not present

## 2019-05-26 DIAGNOSIS — M25552 Pain in left hip: Secondary | ICD-10-CM | POA: Diagnosis not present

## 2019-05-26 DIAGNOSIS — M47812 Spondylosis without myelopathy or radiculopathy, cervical region: Secondary | ICD-10-CM | POA: Diagnosis not present

## 2019-05-26 DIAGNOSIS — M5481 Occipital neuralgia: Secondary | ICD-10-CM | POA: Diagnosis not present

## 2019-06-01 DIAGNOSIS — N183 Chronic kidney disease, stage 3 (moderate): Secondary | ICD-10-CM | POA: Diagnosis not present

## 2019-06-01 DIAGNOSIS — Z23 Encounter for immunization: Secondary | ICD-10-CM | POA: Diagnosis not present

## 2019-06-01 DIAGNOSIS — E785 Hyperlipidemia, unspecified: Secondary | ICD-10-CM | POA: Diagnosis not present

## 2019-06-01 DIAGNOSIS — E039 Hypothyroidism, unspecified: Secondary | ICD-10-CM | POA: Diagnosis not present

## 2019-06-02 DIAGNOSIS — M961 Postlaminectomy syndrome, not elsewhere classified: Secondary | ICD-10-CM | POA: Diagnosis not present

## 2019-06-02 DIAGNOSIS — M25552 Pain in left hip: Secondary | ICD-10-CM | POA: Diagnosis not present

## 2019-06-02 DIAGNOSIS — M5481 Occipital neuralgia: Secondary | ICD-10-CM | POA: Diagnosis not present

## 2019-06-02 DIAGNOSIS — M47812 Spondylosis without myelopathy or radiculopathy, cervical region: Secondary | ICD-10-CM | POA: Diagnosis not present

## 2019-07-05 DIAGNOSIS — M25552 Pain in left hip: Secondary | ICD-10-CM | POA: Diagnosis not present

## 2019-07-05 DIAGNOSIS — M25562 Pain in left knee: Secondary | ICD-10-CM | POA: Diagnosis not present

## 2019-07-05 DIAGNOSIS — M25571 Pain in right ankle and joints of right foot: Secondary | ICD-10-CM | POA: Diagnosis not present

## 2019-07-06 ENCOUNTER — Ambulatory Visit (INDEPENDENT_AMBULATORY_CARE_PROVIDER_SITE_OTHER): Payer: BC Managed Care – PPO | Admitting: Orthopaedic Surgery

## 2019-07-06 ENCOUNTER — Other Ambulatory Visit: Payer: Self-pay

## 2019-07-06 ENCOUNTER — Encounter: Payer: Self-pay | Admitting: Orthopaedic Surgery

## 2019-07-06 DIAGNOSIS — M25562 Pain in left knee: Secondary | ICD-10-CM | POA: Diagnosis not present

## 2019-07-06 DIAGNOSIS — M25462 Effusion, left knee: Secondary | ICD-10-CM

## 2019-07-06 NOTE — Progress Notes (Signed)
Office Visit Note   Patient: Nicole Morris           Date of Birth: 03/19/1961           MRN: [id number] Visit Date: 07/06/2019              Requested by: Laurann Montana, MD [id number] [place],  [place] [address] PCP: Laurann Montana, MD   Assessment & Plan: Visit Diagnoses:  1. Acute pain of left knee   2. Effusion, left knee     Plan: Given her large left knee effusion combined with weakness with knee extension we are concerned about a tendon disruption of her knee of her extensor mechanism.  We are going to place her in a hinged knee brace today and order a MRI for her left knee that needs to be done in the next week to assess for any acute injury that may require surgical intervention.  She understands this fully.  She knows to call us as soon as she knows when her MRI is being obtained so she get a follow-up appointment with [place].  All question concerns were answered and addressed.  She can continue to weight-bear as tolerated in the brace.  Follow-Up Instructions: The patient will call for a follow-up appointment with [place] when she has an MRI obtained of her left knee.  Orders:  No orders of the defined types were placed in this encounter.  No orders of the defined types were placed in this encounter.     Procedures: No procedures performed   Clinical Data: No additional findings.   Subjective: Chief Complaint  Patient presents with  . Left Leg - Pain  The patient is actually regular patient of mine.  She is a 58 year old that we performed a left total hip arthroplasty on last year.  She has had an acute mechanical fall yesterday.  She was seen at an orthopedic urgent care clinic and was told she may have a quadriceps tendon rupture.  This was of the left leg.  She was placed in knee immobilizer.  She is ambulate with a cane with full weightbearing and then to see [place] today.  She does have an ASO in the right ankle because she did injure that ankle as  well.  She was told that x-rays did not show a fracture of her ankle or her knee.  She does have chronic ankle instability in the right side.  The left knee has been swollen and is a bigger issue for her.  HPI  Review of Systems She currently denies any headache, chest pain, shortness of breath, fever, chills, nausea, vomiting  Objective: Vital Signs: There were no vitals taken for this visit.  Physical Exam She is alert and orient x3 and in no acute distress Ortho Exam Examination of her left knee does show a very large effusion consistent with a hemarthrosis from her fall.  She can actually hold her knee in full extension but it is weak to do that.  Her patella is centrally located.  There is pain over the quad tendon and the patella tendon.  Her distal motor and sensory exam is normal and her left foot.  Her left calf is soft. Specialty Comments:  No specialty comments available.  Imaging: No results found.   PMFS History: Patient Active Problem List   Diagnosis Date Noted  . Status post total replacement of left hip 06/14/2018  . Unilateral primary osteoarthritis, left hip 04/26/2018  .  Epicondylitis elbow, medial 12/17/2011  . Lateral epicondylitis 01/09/2011  . BURSITIS, HIP 07/22/2010  . LEG PAIN, RIGHT 05/22/2010  . UNEQUAL LEG LENGTH 05/22/2010  . SCOLIOSIS, LUMBAR SPINE 05/22/2010  . ABNORMALITY OF GAIT 05/22/2010   Past Medical History:  Diagnosis Date  . Anxiety   . Asthma   . Chronic lower back pain   . Dysrhythmia    pt got Tachy last time under anesthesia  . High cholesterol   . History of blood transfusion    "related to surgeries" (06/14/2018)  . Hypothyroidism   . Kidney disease, chronic, stage II (GFR 60-89 ml/min)    "one of my kidneys is in my abdomen" (06/14/2018)  . Migraine    "daily-weekly" (06/14/2018)  . Neuromuscular disorder ([facility])    neuropathy in legs from back surgery  . PONV (postoperative nausea and vomiting)   . Scoliosis      History reviewed. No pertinent family history.  Past Surgical History:  Procedure Laterality Date  . ABDOMINAL HYSTERECTOMY  1988  . BACK SURGERY    . BLADDER AUGMENTATION  1988  . HERNIA REPAIR    . JOINT REPLACEMENT    . KNEE ARTHROSCOPY Right   . SPINAL FUSION  2008   "w/harrington rods, etc"  . TOTAL HIP ARTHROPLASTY Left 06/14/2018  . TOTAL HIP ARTHROPLASTY Left 06/14/2018   Procedure: LEFT TOTAL HIP ARTHROPLASTY ANTERIOR APPROACH;  Surgeon: Mcarthur Rossetti, MD;  Location: [facility];  Service: Orthopedics;  Laterality: Left;   Social History   Occupational History  . Not on file  Tobacco Use  . Smoking status: Never Smoker  . Smokeless tobacco: Never Used  Substance and Sexual Activity  . Alcohol use: Yes    Comment: 06/14/2018 "maybe 1 bottle of wine/year; if that"  . Drug use: Never  . Sexual activity: Not Currently

## 2019-07-14 ENCOUNTER — Telehealth: Payer: Self-pay | Admitting: Orthopaedic Surgery

## 2019-07-14 NOTE — Telephone Encounter (Signed)
LMOM for patient to call the office back to schedule their MRI Review

## 2019-07-22 ENCOUNTER — Ambulatory Visit
Admission: RE | Admit: 2019-07-22 | Discharge: 2019-07-22 | Disposition: A | Discharge status: Home / Self Care | Payer: BC Managed Care – PPO | Source: Ambulatory Visit | Attending: Orthopaedic Surgery | Admitting: Orthopaedic Surgery

## 2019-07-22 ENCOUNTER — Other Ambulatory Visit: Payer: Self-pay

## 2019-07-22 DIAGNOSIS — M25462 Effusion, left knee: Secondary | ICD-10-CM | POA: Diagnosis not present

## 2019-07-22 DIAGNOSIS — M25562 Pain in left knee: Secondary | ICD-10-CM

## 2019-07-27 ENCOUNTER — Encounter: Payer: Self-pay | Admitting: Orthopaedic Surgery

## 2019-07-27 ENCOUNTER — Ambulatory Visit (INDEPENDENT_AMBULATORY_CARE_PROVIDER_SITE_OTHER): Payer: BC Managed Care – PPO | Admitting: Orthopaedic Surgery

## 2019-07-27 ENCOUNTER — Other Ambulatory Visit: Payer: Self-pay

## 2019-07-27 DIAGNOSIS — S82025D Nondisplaced longitudinal fracture of left patella, subsequent encounter for closed fracture with routine healing: Secondary | ICD-10-CM

## 2019-07-27 DIAGNOSIS — M25462 Effusion, left knee: Secondary | ICD-10-CM

## 2019-07-27 DIAGNOSIS — M25562 Pain in left knee: Secondary | ICD-10-CM | POA: Diagnosis not present

## 2019-07-27 NOTE — Progress Notes (Signed)
The patient comes in today to go over an MRI of her left knee.  We saw her last week we were concerned about a disruption of the extensor mechanism of the knee.  She has sustained a mechanical fall and had inability to extend her knee.  We sent her for MRI last week so we can see her today to determine whether or not she needs surgery.  She is in a knee brace.  She does report pain in her left knee but seems to be doing well overall.  On examination she still has a significant pain with palpation over the patella.  She can hold her knee fully extended though.  There is still a mild effusion.  Her calf is soft.  The knee is ligamentously stable but painful.  MRIs reviewed with her.  It actually shows edema in the patella and a vertical fracture line to the lateral aspect of the patella.  This is nondisplaced.  There is a small meniscal root tear which is incomplete.  She does have moderate chondrosis in all 3 compartments of her knee but more severe with areas of full-thickness cartilage loss of the patellofemoral joint.  The lateral meniscus is intact.  The ACL and PCL are intact.  The patella tendon and quad tendon are intact.  This is good news for her in terms of we do not have to proceed with emergent surgical intervention.  She has been to be very careful with her knee not getting down on it and limiting her knee flexion and extension.  I would like to see her back in 4 weeks with an AP and lateral of her left knee.  All question concerns were answered and addressed.  I did give her a copy of the MRI report as well.

## 2019-08-24 ENCOUNTER — Encounter: Payer: Self-pay | Admitting: Orthopaedic Surgery

## 2019-08-24 ENCOUNTER — Ambulatory Visit: Payer: BC Managed Care – PPO | Admitting: Orthopaedic Surgery

## 2019-08-24 ENCOUNTER — Other Ambulatory Visit: Payer: Self-pay

## 2019-08-24 ENCOUNTER — Ambulatory Visit: Payer: Self-pay

## 2019-08-24 DIAGNOSIS — S82025D Nondisplaced longitudinal fracture of left patella, subsequent encounter for closed fracture with routine healing: Secondary | ICD-10-CM | POA: Diagnosis not present

## 2019-08-24 DIAGNOSIS — M25462 Effusion, left knee: Secondary | ICD-10-CM | POA: Diagnosis not present

## 2019-08-24 DIAGNOSIS — M25562 Pain in left knee: Secondary | ICD-10-CM | POA: Diagnosis not present

## 2019-08-24 NOTE — Progress Notes (Signed)
The patient is being seen in follow-up after sustaining a nondisplaced patella fracture of the left knee after mechanical fall directly on that patella.  She had difficulty extending her knee and we sent her for an MRI.  The patella fracture was seen with an intact extensor mechanism.  At her follow-up she is able to straighten and bend her knee.  There is a small tear of the medial meniscus but is not detached.  She is doing much better overall.  She has been wearing a knee brace.  On exam there is no pain when compressing the patella.  Her knee range of motion is full and there is no effusion.  There is just a little bit of discomfort when I really stressed the knee.  I gave her reassurance that the knee is going to do well.  She agrees with this.  X-rays of the left knee also show normal-appearing knee joint and patella.  At this point I will have her avoid high impact aerobic activities without need for at least another 4 to 6 weeks.  She has been having some trochanteric pain over the left hip and a stabbing sensation.  Some of this relates to her body habitus with having scoliosis and her leaning to the left side.  I showed her some stretching exercises to try.  All question concerns were answered and addressed.  Follow-up is as needed.

## 2020-01-15 ENCOUNTER — Encounter (HOSPITAL_COMMUNITY): Payer: Self-pay

## 2020-01-15 ENCOUNTER — Emergency Department (HOSPITAL_COMMUNITY)
Admission: EM | Admit: 2020-01-15 | Discharge: 2020-01-16 | Disposition: A | Discharge status: Home / Self Care | Payer: No Typology Code available for payment source | Attending: Emergency Medicine | Admitting: Emergency Medicine

## 2020-01-15 ENCOUNTER — Emergency Department (HOSPITAL_COMMUNITY): Payer: No Typology Code available for payment source

## 2020-01-15 DIAGNOSIS — Z96642 Presence of left artificial hip joint: Secondary | ICD-10-CM | POA: Diagnosis not present

## 2020-01-15 DIAGNOSIS — S299XXA Unspecified injury of thorax, initial encounter: Secondary | ICD-10-CM | POA: Insufficient documentation

## 2020-01-15 DIAGNOSIS — S42292A Other displaced fracture of upper end of left humerus, initial encounter for closed fracture: Secondary | ICD-10-CM | POA: Diagnosis not present

## 2020-01-15 DIAGNOSIS — S20219A Contusion of unspecified front wall of thorax, initial encounter: Secondary | ICD-10-CM | POA: Insufficient documentation

## 2020-01-15 DIAGNOSIS — S01112A Laceration without foreign body of left eyelid and periocular area, initial encounter: Secondary | ICD-10-CM | POA: Diagnosis present

## 2020-01-15 DIAGNOSIS — Z981 Arthrodesis status: Secondary | ICD-10-CM | POA: Insufficient documentation

## 2020-01-15 DIAGNOSIS — W010XXA Fall on same level from slipping, tripping and stumbling without subsequent striking against object, initial encounter: Secondary | ICD-10-CM | POA: Diagnosis not present

## 2020-01-15 DIAGNOSIS — M25532 Pain in left wrist: Secondary | ICD-10-CM | POA: Insufficient documentation

## 2020-01-15 DIAGNOSIS — S4992XA Unspecified injury of left shoulder and upper arm, initial encounter: Secondary | ICD-10-CM | POA: Diagnosis not present

## 2020-01-15 DIAGNOSIS — R519 Headache, unspecified: Secondary | ICD-10-CM | POA: Insufficient documentation

## 2020-01-15 DIAGNOSIS — S42202A Unspecified fracture of upper end of left humerus, initial encounter for closed fracture: Secondary | ICD-10-CM

## 2020-01-15 DIAGNOSIS — Y929 Unspecified place or not applicable: Secondary | ICD-10-CM | POA: Diagnosis not present

## 2020-01-15 DIAGNOSIS — N182 Chronic kidney disease, stage 2 (mild): Secondary | ICD-10-CM | POA: Diagnosis not present

## 2020-01-15 DIAGNOSIS — Y999 Unspecified external cause status: Secondary | ICD-10-CM | POA: Diagnosis not present

## 2020-01-15 DIAGNOSIS — M549 Dorsalgia, unspecified: Secondary | ICD-10-CM | POA: Diagnosis not present

## 2020-01-15 DIAGNOSIS — R9389 Abnormal findings on diagnostic imaging of other specified body structures: Secondary | ICD-10-CM | POA: Diagnosis not present

## 2020-01-15 DIAGNOSIS — Z23 Encounter for immunization: Secondary | ICD-10-CM | POA: Diagnosis not present

## 2020-01-15 DIAGNOSIS — S0181XA Laceration without foreign body of other part of head, initial encounter: Secondary | ICD-10-CM

## 2020-01-15 DIAGNOSIS — Y93K1 Activity, walking an animal: Secondary | ICD-10-CM | POA: Diagnosis not present

## 2020-01-15 LAB — CBC WITH DIFFERENTIAL/PLATELET
Abs Immature Granulocytes: 0.01 10*3/uL (ref 0.00–0.07)
Basophils Absolute: 0 10*3/uL (ref 0.0–0.1)
Basophils Relative: 0 %
Eosinophils Absolute: 0.2 10*3/uL (ref 0.0–0.5)
Eosinophils Relative: 3 %
HCT: 37.2 % (ref 36.0–46.0)
Hemoglobin: 12.4 g/dL (ref 12.0–15.0)
Immature Granulocytes: 0 %
Lymphocytes Relative: 53 %
Lymphs Abs: 2.9 10*3/uL (ref 0.7–4.0)
MCH: 30.9 pg (ref 26.0–34.0)
MCHC: 33.3 g/dL (ref 30.0–36.0)
MCV: 92.8 fL (ref 80.0–100.0)
Monocytes Absolute: 0.4 10*3/uL (ref 0.1–1.0)
Monocytes Relative: 7 %
Neutro Abs: 2 10*3/uL (ref 1.7–7.7)
Neutrophils Relative %: 37 %
Platelets: 205 10*3/uL (ref 150–400)
RBC: 4.01 MIL/uL (ref 3.87–5.11)
RDW: 13 % (ref 11.5–15.5)
WBC: 5.5 10*3/uL (ref 4.0–10.5)
nRBC: 0 % (ref 0.0–0.2)

## 2020-01-15 LAB — COMPREHENSIVE METABOLIC PANEL
ALT: 13 U/L (ref 0–44)
AST: 18 U/L (ref 15–41)
Albumin: 3.9 g/dL (ref 3.5–5.0)
Alkaline Phosphatase: 105 U/L (ref 38–126)
Anion gap: 15 (ref 5–15)
BUN: 13 mg/dL (ref 6–20)
CO2: 19 mmol/L — ABNORMAL LOW (ref 22–32)
Calcium: 9.4 mg/dL (ref 8.9–10.3)
Chloride: 105 mmol/L (ref 98–111)
Creatinine, Ser: 1.21 mg/dL — ABNORMAL HIGH (ref 0.44–1.00)
GFR calc Af Amer: 57 mL/min — ABNORMAL LOW (ref >60)
GFR calc non Af Amer: 49 mL/min — ABNORMAL LOW (ref >60)
Glucose, Bld: 116 mg/dL — ABNORMAL HIGH (ref 70–99)
Potassium: 3.8 mmol/L (ref 3.5–5.1)
Sodium: 139 mmol/L (ref 135–145)
Total Bilirubin: 0.6 mg/dL (ref 0.3–1.2)
Total Protein: 7.2 g/dL (ref 6.5–8.1)

## 2020-01-15 LAB — TROPONIN I (HIGH SENSITIVITY)
Troponin I (High Sensitivity): 3 ng/L (ref <18)
Troponin I (High Sensitivity): 3 ng/L (ref <18)

## 2020-01-15 LAB — CK: Total CK: 146 U/L (ref 38–234)

## 2020-01-15 MED ORDER — LIDOCAINE HCL 2 % IJ SOLN
10.0000 mL | Freq: Once | INTRAMUSCULAR | Status: AC
  Administered 2020-01-15: 200 mg
  Filled 2020-01-15: qty 20

## 2020-01-15 MED ORDER — SODIUM CHLORIDE 0.9 % IV BOLUS
1000.0000 mL | Freq: Once | INTRAVENOUS | Status: AC
  Administered 2020-01-15: 1000 mL via INTRAVENOUS

## 2020-01-15 MED ORDER — MORPHINE SULFATE (PF) 4 MG/ML IV SOLN
4.0000 mg | Freq: Once | INTRAVENOUS | Status: AC
  Administered 2020-01-15: 4 mg via INTRAVENOUS
  Filled 2020-01-15: qty 1

## 2020-01-15 MED ORDER — HYDROMORPHONE HCL 1 MG/ML IJ SOLN
1.0000 mg | Freq: Once | INTRAMUSCULAR | Status: AC
  Administered 2020-01-15: 1 mg via INTRAVENOUS
  Filled 2020-01-15: qty 1

## 2020-01-15 MED ORDER — PROMETHAZINE HCL 25 MG/ML IJ SOLN
25.0000 mg | Freq: Once | INTRAMUSCULAR | Status: AC
  Administered 2020-01-15: 25 mg via INTRAVENOUS
  Filled 2020-01-15: qty 1

## 2020-01-15 MED ORDER — IOHEXOL 300 MG/ML  SOLN
100.0000 mL | Freq: Once | INTRAMUSCULAR | Status: AC | PRN
  Administered 2020-01-15: 100 mL via INTRAVENOUS

## 2020-01-15 MED ORDER — TETANUS-DIPHTH-ACELL PERTUSSIS 5-2.5-18.5 LF-MCG/0.5 IM SUSP
0.5000 mL | Freq: Once | INTRAMUSCULAR | Status: AC
  Administered 2020-01-15: 0.5 mL via INTRAMUSCULAR
  Filled 2020-01-15: qty 0.5

## 2020-01-15 NOTE — ED Provider Notes (Signed)
[facility] EMERGENCY DEPARTMENT Provider Note   CSN: [id number] Arrival date & time: 01/15/20  1956     History Chief Complaint  Patient presents with  . Fall    Nicole Morris is a 59 y.o. female history of multiple back surgeries, CKD, high cholesterol, here presenting with fall.  Patient states that she was walking her dog and had a mechanical fall and fell forward.  She states that she landed on the left shoulder and had a laceration to the left eyebrow area.  She had multiple surgeries on her back as well as her knees.  She states that she has been falling recently due to her back pain.  She denies any numbness or weakness of her legs.  EMS noticed large laceration of left eyebrow.  She was also noted to have left shoulder deformity so a sling was placed.  She received fentanyl prior to arrival.  The history is provided by the patient.       Past Medical History:  Diagnosis Date  . Anxiety   . Asthma   . Chronic lower back pain   . Dysrhythmia    pt got Tachy last time under anesthesia  . High cholesterol   . History of blood transfusion    "related to surgeries" (06/14/2018)  . Hypothyroidism   . Kidney disease, chronic, stage II (GFR 60-89 ml/min)    "one of my kidneys is in my abdomen" (06/14/2018)  . Migraine    "daily-weekly" (06/14/2018)  . Neuromuscular disorder (HCC)    neuropathy in legs from back surgery  . PONV (postoperative nausea and vomiting)   . Scoliosis     Patient Active Problem List   Diagnosis Date Noted  . Status post total replacement of left hip 06/14/2018  . Unilateral primary osteoarthritis, left hip 04/26/2018  . Epicondylitis elbow, medial 12/17/2011  . Lateral epicondylitis 01/09/2011  . BURSITIS, HIP 07/22/2010  . LEG PAIN, RIGHT 05/22/2010  . UNEQUAL LEG LENGTH 05/22/2010  . SCOLIOSIS, LUMBAR SPINE 05/22/2010  . ABNORMALITY OF GAIT 05/22/2010    Past Surgical History:  Procedure Laterality Date  . ABDOMINAL  HYSTERECTOMY  1988  . BACK SURGERY    . BLADDER AUGMENTATION  1988  . HERNIA REPAIR    . JOINT REPLACEMENT    . KNEE ARTHROSCOPY Right   . SPINAL FUSION  2008   "w/harrington rods, etc"  . TOTAL HIP ARTHROPLASTY Left 06/14/2018  . TOTAL HIP ARTHROPLASTY Left 06/14/2018   Procedure: LEFT TOTAL HIP ARTHROPLASTY ANTERIOR APPROACH;  Surgeon: Kathryne Hitch, MD;  Location: [facility] OR;  Service: Orthopedics;  Laterality: Left;     OB History   No obstetric history on file.     No family history on file.  Social History   Tobacco Use  . Smoking status: Never Smoker  . Smokeless tobacco: Never Used  Substance Use Topics  . Alcohol use: Yes    Comment: 06/14/2018 "maybe 1 bottle of wine/year; if that"  . Drug use: Never    Home Medications Prior to Admission medications   Medication Sig Start Date End Date Taking? Authorizing Provider  albuterol (PROVENTIL HFA;VENTOLIN HFA) 108 (90 BASE) MCG/ACT inhaler Inhale 2 puffs into the lungs every 6 (six) hours as needed for wheezing or shortness of breath.    [provider]  aspirin 81 MG chewable tablet Chew 1 tablet (81 mg total) by mouth 2 (two) times daily. 06/17/18   Kathryne Hitch, MD  buPROPion [facility]  XL) 300 MG 24 hr tablet Take 300 mg by mouth daily.  11/30/17   [provider]  butalbital-acetaminophen-caffeine (FIORICET, ESGIC) 26-712-45 MG tablet Take 1 tablet by mouth daily as needed for headache. 03/25/18   [provider]  calcium-vitamin D (OSCAL WITH D) 500-200 MG-UNIT tablet Take 1 tablet by mouth daily with breakfast.    [provider]  cyclobenzaprine (FLEXERIL) 5 MG tablet Take 5 mg by mouth 3 (three) times daily as needed for muscle spasms.  02/21/18   [provider]  diclofenac sodium (VOLTAREN) 1 % GEL Apply 2 g topically daily as needed (joint pain).  01/21/18   [provider]  EPINEPHrine (EPIPEN) 0.3 mg/0.3 mL IJ SOAJ injection Inject 0.3 mLs (0.3  mg total) into the muscle as needed. 04/08/14   Rolan Bucco, MD  gabapentin (NEURONTIN) 300 MG capsule Take 300 mg by mouth at bedtime.    [provider]  levothyroxine (SYNTHROID, LEVOTHROID) 50 MCG tablet Take 50 mcg by mouth daily before breakfast. 03/03/18   [provider]  LORazepam (ATIVAN) 0.5 MG tablet Take 0.25-0.5 mg by mouth every 8 (eight) hours as needed for anxiety.    [provider]  ondansetron (ZOFRAN ODT) 4 MG disintegrating tablet Take 1 tablet (4 mg total) by mouth every 8 (eight) hours as needed for nausea or vomiting. 06/17/18   Kathryne Hitch, MD  oxyCODONE (OXY IR/ROXICODONE) 5 MG immediate release tablet Take 1-2 tablets (5-10 mg total) by mouth every 4 (four) hours as needed for moderate pain (pain score 4-6). 06/17/18   Kathryne Hitch, MD  rosuvastatin (CRESTOR) 5 MG tablet Take 5 mg by mouth daily.  02/18/18   [provider]  sertraline (ZOLOFT) 100 MG tablet Take 100 mg by mouth every evening.     [provider]  tapentadol (NUCYNTA ER) 50 MG 12 hr tablet Take 50 mg by mouth daily.     [provider]  Vitamin D, Ergocalciferol, (DRISDOL) 50000 units CAPS capsule TAKE ONE CAPSULE BY MOUTH EVERY 7 DAYS Patient taking differently: Take 50,000 Units by mouth every 7 (seven) days.  04/20/18   Kerrin Champagne, MD    Allergies    Codeine and Mushroom extract complex  Review of Systems   Review of Systems  Musculoskeletal:       L shoulder and wrist pain   Skin: Positive for wound.  Neurological: Positive for headaches.  All other systems reviewed and are negative.   Physical Exam Updated Vital Signs BP (!) 134/95   Pulse 83   Temp 98.1 F (36.7 C) (Oral)   Resp 19   Ht 5\' 2"  (1.575 m)   Wt 67.1 kg   SpO2 99%   BMI 27.07 kg/m   Physical Exam Vitals and nursing note reviewed.  Constitutional:      Comments: Uncomfortable   HENT:     Head:     Comments: 5 cm laceration L eyebrow       Mouth/Throat:     Mouth: Mucous membranes are moist.  Eyes:     Extraocular Movements: Extraocular movements intact.     Pupils: Pupils are equal, round, and reactive to light.  Neck:     Comments: C collar in place  Cardiovascular:     Rate and Rhythm: Normal rate and regular rhythm.     Pulses: Normal pulses.  Pulmonary:     Effort: Pulmonary effort is normal.     Breath sounds: Normal  breath sounds.     Comments: Bruising L chest, + bilateral breath sounds  Abdominal:     General: Abdomen is flat.     Palpations: Abdomen is soft.  Musculoskeletal:     Comments: Previous spinal surgeries but there is no obvious midline tenderness no saddle anesthesia on exam.  Pelvis is stable.  Patient does have left shoulder tenderness with no obvious deformity.  She has some tenderness along the humerus and forearm and wrist.  She has 2+ radial pulse and able to hand grasp   Skin:    General: Skin is warm.     Capillary Refill: Capillary refill takes less than 2 seconds.  Neurological:     General: No focal deficit present.     Mental Status: She is oriented to person, place, and time.  Psychiatric:        Mood and Affect: Mood normal.        Behavior: Behavior normal.     ED Results / Procedures / Treatments   Labs (all labs ordered are listed, but only abnormal results are displayed) Labs Reviewed  COMPREHENSIVE METABOLIC PANEL - Abnormal; Notable for the following components:      Result Value   CO2 19 (*)    Glucose, Bld 116 (*)    Creatinine, Ser 1.21 (*)    GFR calc non Af Amer 49 (*)    GFR calc Af Amer 57 (*)    All other components within normal limits  URINE CULTURE  CBC WITH DIFFERENTIAL/PLATELET  CK  URINALYSIS, ROUTINE W REFLEX MICROSCOPIC  TROPONIN I (HIGH SENSITIVITY)  TROPONIN I (HIGH SENSITIVITY)    EKG None  Radiology DG Chest 1 View  Result Date: 01/15/2020 CLINICAL DATA:  Fall EXAM: CHEST  1 VIEW COMPARISON:  06/06/2011 FINDINGS: Acute mildly  comminuted fracture involving the proximal left humerus. No focal opacity or pleural effusion. Normal heart size. Enlarged appearing mediastinal silhouette likely due to patient rotation and portable technique. No pneumothorax. IMPRESSION: No active disease. Enlarged mediastinal silhouette likely due to patient rotation and portable technique. Acute left proximal humerus fracture Electronically Signed   By: Donavan Foil M.D.   On: 01/15/2020 21:44   DG Pelvis 1-2 Views  Result Date: 01/15/2020 CLINICAL DATA:  Fall EXAM: PELVIS - 1-2 VIEW COMPARISON:  06/14/2018 FINDINGS: rods and fixating screws within the lumbosacral spine and across the SI joints. Prior left hip replacement with normal alignment. Possible slight distal migration of left SI fixating screw with the tip potentially touching the acetabular cup. There appears to be focal radiolucent area at the acetabular cup, question focal area of hardware failure or loosening. Pubic symphysis and rami are intact. No fracture. Postsurgical changes in the right pelvis. IMPRESSION: 1. No acute fracture identified 2. Surgical hardware in the lumbosacral spine and pelvis. Previous left hip replacement. Possible distal migration of left SI fixating screw, its tip may be touching the acetabular cup. In addition there is a focal lucent area at the acetabular cup, question focal area of hardware failure. Electronically Signed   By: Donavan Foil M.D.   On: 01/15/2020 21:51   DG Forearm Left  Result Date: 01/15/2020 CLINICAL DATA:  Fall EXAM: LEFT FOREARM - 2 VIEW COMPARISON:  None. FINDINGS: There is no evidence of fracture or other focal bone lesions. Soft tissues are unremarkable. IMPRESSION: Negative. Electronically Signed   By: Donavan Foil M.D.   On: 01/15/2020 21:45   CT Head Wo Contrast  Result Date: 01/15/2020 CLINICAL  DATA:  Fall with laceration to the forehead. EXAM: CT HEAD WITHOUT CONTRAST CT CERVICAL SPINE WITHOUT CONTRAST TECHNIQUE: Multidetector  CT imaging of the head and cervical spine was performed following the standard protocol without intravenous contrast. Multiplanar CT image reconstructions of the cervical spine were also generated. COMPARISON:  08/12/2016 FINDINGS: CT HEAD FINDINGS Brain: No evidence of old or acute infarction, mass lesion, hemorrhage, hydrocephalus or extra-axial collection. Vascular: Slight dolichoectasia of the major vessels at the base of the brain. Skull: No skull fracture. Sinuses/Orbits: Clear/normal Other: Left forehead scalp injury. No sign of radiopaque foreign object. CT CERVICAL SPINE FINDINGS Alignment: Chronic kyphotic deformity in the C3 through C5 region. Skull base and vertebrae: Congenital failure of separation from C2 through C3 and from C5 into the thoracic region consistent with Klippel-Feil deformity. Soft tissues and spinal canal: Negative Disc levels: Foramen magnum sufficiently patent. C1-2 level is unremarkable. Congenital failure of segmentation at C2-3 but with sufficient patency of the canal and foramina. At C3-4, there is chronic degenerative spondylosis with disc space narrowing, vacuum phenomenon, endplate osteophytes and facet arthropathy on the right. There is canal stenosis and right foraminal stenosis that could be symptomatic At C4-5, there is an appearance of acquired fusion. There is been previous posterior decompression. Chronic anterior canal encroachment secondary to the endplate osteophytes. C5 through thoracic region. Congenital failure of segmentation with apparent sufficient patency of the canal and foramina. Upper chest: Negative Other: None IMPRESSION: Head CT: Normal except for a left forehead scalp injury. Cervical spine CT: No acute or traumatic finding. 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 Acquired fusion at the C4-5 level. Chronic degenerative spondylosis at C3-4. Right facet arthropathy. Canal stenosis and right foraminal  stenosis as seen previously. Electronically Signed   By: Paulina Fusi M.D.   On: 01/15/2020 21:21   CT Cervical Spine Wo Contrast  Result Date: 01/15/2020 CLINICAL DATA:  Fall with laceration to the forehead. EXAM: CT HEAD WITHOUT CONTRAST CT CERVICAL SPINE WITHOUT CONTRAST TECHNIQUE: Multidetector CT imaging of the head and cervical spine was performed following the standard protocol without intravenous contrast. Multiplanar CT image reconstructions of the cervical spine were also generated. COMPARISON:  08/12/2016 FINDINGS: CT HEAD FINDINGS Brain: No evidence of old or acute infarction, mass lesion, hemorrhage, hydrocephalus or extra-axial collection. Vascular: Slight dolichoectasia of the major vessels at the base of the brain. Skull: No skull fracture. Sinuses/Orbits: Clear/normal Other: Left forehead scalp injury. No sign of radiopaque foreign object. CT CERVICAL SPINE FINDINGS Alignment: Chronic kyphotic deformity in the C3 through C5 region. Skull base and vertebrae: Congenital failure of separation from C2 through C3 and from C5 into the thoracic region consistent with Klippel-Feil deformity. Soft tissues and spinal canal: Negative Disc levels: Foramen magnum sufficiently patent. C1-2 level is unremarkable. Congenital failure of segmentation at C2-3 but with sufficient patency of the canal and foramina. At C3-4, there is chronic degenerative spondylosis with disc space narrowing, vacuum phenomenon, endplate osteophytes and facet arthropathy on the right. There is canal stenosis and right foraminal stenosis that could be symptomatic At C4-5, there is an appearance of acquired fusion. There is been previous posterior decompression. Chronic anterior canal encroachment secondary to the endplate osteophytes. C5 through thoracic region. Congenital failure of segmentation with apparent sufficient patency of the canal and foramina. Upper chest: Negative Other: None IMPRESSION: Head CT: Normal except for a left  forehead scalp injury. Cervical spine CT: No acute or traumatic finding.  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 Acquired fusion at the C4-5 level. Chronic degenerative spondylosis at C3-4. Right facet arthropathy. Canal stenosis and right foraminal stenosis as seen previously. Electronically Signed   By: Paulina FusiMark  Shogry M.D.   On: 01/15/2020 21:21   DG Shoulder Left  Result Date: 01/15/2020 CLINICAL DATA:  Fall EXAM: LEFT SHOULDER - 2+ VIEW COMPARISON:  None. FINDINGS: Acute mildly comminuted fracture involving the left humeral neck and greater tuberosity. No humeral head dislocation. [facility]. IMPRESSION: Acute mildly comminuted fracture involving the left humeral neck and greater tuberosity without significant displacement or angulation. Electronically Signed   By: Jasmine PangKim  Fujinaga M.D.   On: 01/15/2020 21:43   DG Humerus Left  Result Date: 01/15/2020 CLINICAL DATA:  Fall EXAM: LEFT HUMERUS - 2+ VIEW COMPARISON:  None. FINDINGS: Acute mildly comminuted fracture involving the left humeral neck and greater tuberosity. Shaft and distal humerus appear intact. No humeral head dislocation IMPRESSION: Acute mildly comminuted fracture involving the proximal left humerus. Electronically Signed   By: Jasmine PangKim  Fujinaga M.D.   On: 01/15/2020 21:45    Procedures Procedures (including critical care time)  LACERATION REPAIR Performed by: Richardean Canalavid H Altovise Wahler Authorized by: Richardean Canalavid H Harl Wiechmann Consent: Verbal consent obtained. Risks and benefits: risks, benefits and alternatives were discussed Consent given by: patient Patient identity confirmed: provided demographic data Prepped and Draped in normal sterile fashion Wound explored  Laceration Location: L eyebrow  Laceration Length: 5 cm  No Foreign Bodies seen or palpated  Anesthesia: local infiltration  Local anesthetic: lidocaine 2 % no epinephrine  Anesthetic total: 10 ml  Irrigation method:  syringe Amount of cleaning: standard  Skin closure: multi layer- subcutaneous, superficial  Number of sutures: 1 5-0 vicryl subcutaneous, 6 6-0 ethilon simple interrupted   Technique: see above   Patient tolerance: Patient tolerated the procedure well with no immediate complications.   Medications Ordered in ED Medications  HYDROmorphone (DILAUDID) injection 1 mg (has no administration in time range)  morphine 4 MG/ML injection 4 mg (4 mg Intravenous Given 01/15/20 2024)  promethazine (PHENERGAN) injection 25 mg (25 mg Intravenous Given 01/15/20 2024)  sodium chloride 0.9 % bolus 1,000 mL (0 mLs Intravenous Stopped 01/15/20 2235)  Tdap (BOOSTRIX) injection 0.5 mL (0.5 mLs Intramuscular Given 01/15/20 2030)  lidocaine (XYLOCAINE) 2 % (with pres) injection 200 mg (200 mg Other Given by Other 01/15/20 2030)    ED Course  I have reviewed the triage vital signs and the nursing notes.  Pertinent labs & imaging results that were available during my care of the patient were reviewed by me and considered in my medical decision making (see chart for details).    MDM Rules/Calculators/A&P                      Bjorn LoserCathy Morris is a 59 y.o. female here presenting with fall and have a laceration of the left elbow as well as left shoulder pain and neck pain .  Will get CT head and neck and trauma x-rays .  We will also get some basic lab work as well   11:12 PM CT head and neck unremarkable.  Laceration sutured by me.  She does have a left humeral head fracture.  Also some loose hardware in the left acetabulum.  I talked to Dr. Lajoyce Cornersuda who is covering Dr. Magnus IvanBlackman.  He states that for the left acetabular hardware issue, she can  follow-up with outpatient.  He also recommend shoulder immobilizer for the humeral head fracture.  Her chest x-ray showed slightly widened mediastinum and she does have some bruising in her chest so order CT chest abdomen pelvis to further assess. Of note, she has a pain specialist  and she is on nucynta at baseline.   11:20 pm Signed out to Dr. Bebe Shaggy to follow up CT chest/ab/pel and ambulate patient. If patient able to ambulate then anticipate dc home.   Final Clinical Impression(s) / ED Diagnoses Final diagnoses:  Back pain    Rx / DC Orders ED Discharge Orders    None       Charlynne Pander, MD 01/16/20 1505

## 2020-01-15 NOTE — Progress Notes (Signed)
Orthopedic Tech Progress Note Patient Details:  Nicole Morris September 28, 1960 [id number]  Ortho Devices Type of Ortho Device: Shoulder immobilizer Ortho Device/Splint Location: LUE Ortho Device/Splint Interventions: Application   Post Interventions Patient Tolerated: Well Instructions Provided: Adjustment of device   Brentyn Seehafer E Ormand Senn 01/15/2020, 10:42 PM

## 2020-01-15 NOTE — ED Provider Notes (Signed)
Plan to f/u on imaging/labs If imaging negative, ambulate patient, f/u with ortho Magnus Ivan)  EKG Interpretation  Date/Time:  Monday Jan 15 2020 21:57:33 EDT Ventricular Rate:  79 PR Interval:    QRS Duration: 106 QT Interval:  408 QTC Calculation: 468 R Axis:   49 Text Interpretation: Sinus rhythm RSR' in V1 or V2, right VCD or RVH Borderline T abnormalities, anterior leads Confirmed by Zadie Rhine ([id number]) on 01/15/2020 11:27:02 PM         Zadie Rhine, MD 01/15/20 2328

## 2020-01-15 NOTE — ED Triage Notes (Signed)
Pt comes via [facility] from home, pt was walking her dog, pt has had a fall for the past 3 days, having back pain that has caused her to fall. Found prone, lac above L eye, appx 1 in, c/o of L shoulder pain, neck pain. PTA received 50 mcg fentanyl.

## 2020-01-16 ENCOUNTER — Other Ambulatory Visit: Payer: Self-pay

## 2020-01-16 ENCOUNTER — Ambulatory Visit: Payer: 59 | Admitting: Orthopaedic Surgery

## 2020-01-16 ENCOUNTER — Encounter: Payer: Self-pay | Admitting: Orthopaedic Surgery

## 2020-01-16 DIAGNOSIS — Z96642 Presence of left artificial hip joint: Secondary | ICD-10-CM

## 2020-01-16 DIAGNOSIS — S42292A Other displaced fracture of upper end of left humerus, initial encounter for closed fracture: Secondary | ICD-10-CM

## 2020-01-16 MED ORDER — OXYCODONE HCL 5 MG PO TABS: 5.0000 mg | ORAL_TABLET | ORAL | 0 refills | Status: DC | PRN

## 2020-01-16 NOTE — ED Provider Notes (Signed)
CT imaging negative for acute findings.?Congenital absence of kidney or nephrectomy on CT, patient reports she has both kidneys but does have chronic kidney disease.  Advised he can follow-up for this.  She agrees to follow-up with orthopedics for her humerus fracture.  Patient was able to ambulate with some mild nausea likely due to pain medicine.  She is keeping p.o. fluids down now.  She is comfortable for discharge home.  She will follow with pain specialist for any pain management   Zadie Rhine, MD 01/16/20 [phone]

## 2020-01-16 NOTE — ED Notes (Signed)
Ambulated pt about 10 feet. Pt started complaining of dizziness and being light-headed. Walked back to room. Upon getting to bed pt got nauseous and started dry heaving.

## 2020-01-16 NOTE — Progress Notes (Signed)
Office Visit Note   Patient: Nicole Morris           Date of Birth: 01-08-1961           MRN: [id number] Visit Date: 01/16/2020              Requested by: Laurann Montana, MD [phone] [address] [place],  [place] [address] PCP: Laurann Montana, MD   Assessment & Plan: Visit Diagnoses:  1. Status post total replacement of left hip   2. Closed 3-part fracture of proximal humerus, left, initial encounter     Plan: From a shoulder standpoint, I would like to keep her in the sling and repeat a single AP view of the left shoulder at her visit in 1 week to make sure it stays nondisplaced.  We will send in oxycodone for her pain and she will contact her pain management specialist about this as well.  All questions and concerns were answered addressed.  We will see her back in 1 week for the single AP view of the left shoulder.  Follow-Up Instructions: Return in about 1 week (around 01/23/2020).   Orders:  No orders of the defined types were placed in this encounter.  Meds ordered this encounter  Medications  . DISCONTD: oxyCODONE (OXY IR/ROXICODONE) 5 MG immediate release tablet    Sig: Take 1-2 tablets (5-10 mg total) by mouth every 4 (four) hours as needed for moderate pain (pain score 4-6).    Dispense:  40 tablet    Refill:  0  . oxyCODONE (OXY IR/ROXICODONE) 5 MG immediate release tablet    Sig: Take 1-2 tablets (5-10 mg total) by mouth every 4 (four) hours as needed for moderate pain (pain score 4-6).    Dispense:  40 tablet    Refill:  0      Procedures: No procedures performed   Clinical Data: No additional findings.   Subjective: Chief Complaint  Patient presents with  . Left Shoulder - Pain, Fracture  . Left Hip - Pain  The patient is seen as a work in today.  She sustained a mechanical fall when she was outside yesterday injuring her left shoulder.  She also sustained a scalp injury and is complaining of left hip pain.  We actually replaced her left hip  over a year ago.  She has had a lot of falls recently.  She has extensive thoracic and lumbar spine surgery that was done elsewhere.  In the emergency room she was found to have a nondisplaced three-part proximal humerus fracture left shoulder and placed in a sling appropriately.  She is walking without assistive device and does have a significant bout of pain.  She is in chronic pain management with Dr. Vear Clock.  We took over her pain management after hip replacement for short amount of time.  She just takes Nucynta at night.  We will need to prescribe oxycodone for the short term and she understands to let her primary pain management physician know this.  HPI  Review of Systems Today she denies any shortness of breath, chest pain, fever, chills, nausea, vomiting.  She does report headache given her fall yesterday.  Objective: Vital Signs: There were no vitals taken for this visit.  Physical Exam She is alert and orient x3 and in no acute distress Ortho Exam Examination of her left shoulder shows that it is appropriately located and in a sling that is an appropriate position.  Distally her hand exam is entirely  normal.  Her hand is well-perfused on the left side and neurovascularly intact. Specialty Comments:  No specialty comments available.  Imaging: DG Chest 1 View  Result Date: 01/15/2020 CLINICAL DATA:  Fall EXAM: CHEST  1 VIEW COMPARISON:  06/06/2011 FINDINGS: Acute mildly comminuted fracture involving the proximal left humerus. No focal opacity or pleural effusion. Normal heart size. Enlarged appearing mediastinal silhouette likely due to patient rotation and portable technique. No pneumothorax. IMPRESSION: No active disease. Enlarged mediastinal silhouette likely due to patient rotation and portable technique. Acute left proximal humerus fracture Electronically Signed   By: Jasmine Pang M.D.   On: 01/15/2020 21:44   DG Pelvis 1-2 Views  Result Date: 01/15/2020 CLINICAL DATA:  Fall  EXAM: PELVIS - 1-2 VIEW COMPARISON:  06/14/2018 FINDINGS: rods and fixating screws within the lumbosacral spine and across the SI joints. Prior left hip replacement with normal alignment. Possible slight distal migration of left SI fixating screw with the tip potentially touching the acetabular cup. There appears to be focal radiolucent area at the acetabular cup, question focal area of hardware failure or loosening. Pubic symphysis and rami are intact. No fracture. Postsurgical changes in the right pelvis. IMPRESSION: 1. No acute fracture identified 2. Surgical hardware in the lumbosacral spine and pelvis. Previous left hip replacement. Possible distal migration of left SI fixating screw, its tip may be touching the acetabular cup. In addition there is a focal lucent area at the acetabular cup, question focal area of hardware failure. Electronically Signed   By: Jasmine Pang M.D.   On: 01/15/2020 21:51   DG Forearm Left  Result Date: 01/15/2020 CLINICAL DATA:  Fall EXAM: LEFT FOREARM - 2 VIEW COMPARISON:  None. FINDINGS: There is no evidence of fracture or other focal bone lesions. Soft tissues are unremarkable. IMPRESSION: Negative. Electronically Signed   By: Jasmine Pang M.D.   On: 01/15/2020 21:45   CT Head Wo Contrast  Result Date: 01/15/2020 CLINICAL DATA:  Fall with laceration to the forehead. EXAM: CT HEAD WITHOUT CONTRAST CT CERVICAL SPINE WITHOUT CONTRAST TECHNIQUE: Multidetector CT imaging of the head and cervical spine was performed following the standard protocol without intravenous contrast. Multiplanar CT image reconstructions of the cervical spine were also generated. COMPARISON:  08/12/2016 FINDINGS: CT HEAD FINDINGS Brain: No evidence of old or acute infarction, mass lesion, hemorrhage, hydrocephalus or extra-axial collection. Vascular: Slight dolichoectasia of the major vessels at the base of the brain. Skull: No skull fracture. Sinuses/Orbits: Clear/normal Other: Left forehead scalp  injury. No sign of radiopaque foreign object. CT CERVICAL SPINE FINDINGS Alignment: Chronic kyphotic deformity in the C3 through C5 region. Skull base and vertebrae: Congenital failure of separation from C2 through C3 and from C5 into the thoracic region consistent with Klippel-Feil deformity. Soft tissues and spinal canal: Negative Disc levels: Foramen magnum sufficiently patent. C1-2 level is unremarkable. Congenital failure of segmentation at C2-3 but with sufficient patency of the canal and foramina. At C3-4, there is chronic degenerative spondylosis with disc space narrowing, vacuum phenomenon, endplate osteophytes and facet arthropathy on the right. There is canal stenosis and right foraminal stenosis that could be symptomatic At C4-5, there is an appearance of acquired fusion. There is been previous posterior decompression. Chronic anterior canal encroachment secondary to the endplate osteophytes. C5 through thoracic region. Congenital failure of segmentation with apparent sufficient patency of the canal and foramina. Upper chest: Negative Other: None IMPRESSION: Head CT: Normal except for a left forehead scalp injury. Cervical spine CT: No acute or  traumatic finding. 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 Acquired fusion at the C4-5 level. Chronic degenerative spondylosis at C3-4. Right facet arthropathy. Canal stenosis and right foraminal stenosis as seen previously. Electronically Signed   By: Nelson Chimes M.D.   On: 01/15/2020 21:21   CT Chest W Contrast  Result Date: 01/15/2020 CLINICAL DATA:  Fall, widened mediastinum EXAM: CT CHEST, abdomen and pelvis WITH CONTRAST TECHNIQUE: Multidetector CT imaging of the chest was performed during intravenous contrast administration. CONTRAST:  138mL OMNIPAQUE IOHEXOL 300 MG/ML  SOLN COMPARISON:  None. FINDINGS: Cardiovascular: Normal heart size. No significant pericardial fluid/thickening. There is a  tortuous descending intrathoracic aorta present. Great vessels are normal in course and caliber. No evidence of acute thoracic aortic injury. No central pulmonary emboli. Mediastinum/Nodes: No pneumomediastinum. No mediastinal hematoma. Unremarkable esophagus. No axillary, mediastinal or hilar lymphadenopathy. Lungs/Pleura:Minimal bibasilar dependent atelectasis is noted. No pneumothorax. No pleural effusion. Musculoskeletal: Partially visualized proximal left humerus fracture is seen. Abdomen/pelvis: Hepatobiliary: Homogeneous hepatic attenuation without traumatic injury. No focal lesion. Gallbladder physiologically distended, no calcified stone. No biliary dilatation. Pancreas: No evidence for traumatic injury. Portions are partially obscured by adjacent bowel loops and paucity of intra-abdominal fat. No ductal dilatation or inflammation. Spleen: Homogeneous attenuation without traumatic injury. Normal in size. Adrenals/Urinary Tract: No adrenal hemorrhage. Scattered congenital absence or nephrectomy of the right kidney is seen. There is areas of cortical scarring throughout the left kidney. No evidence or renal injury. Ureters are well opacified proximal through mid portion. Bladder is physiologically distended with mild diffuse bladder wall thickening. Stomach/Bowel: Suboptimally assessed without enteric contrast, allowing for this, no evidence of bowel injury. Stomach physiologically distended. There are no dilated or thickened small or large bowel loops. Moderate stool burden. No evidence of mesenteric hematoma. No free air free fluid. Surgical clips are seen within the right lower quadrant. Vascular/Lymphatic: No acute vascular injury. The abdominal aorta and IVC are intact. No evidence of retroperitoneal, abdominal, or pelvic adenopathy. Reproductive: No acute abnormality. Other: No focal contusion or abnormality of the abdominal wall. Musculoskeletal: No acute fracture of the lumbar spine or bony pelvis.  Posterior Peri to rod fixation seen throughout the thoracolumbar spine with a dextroconvex scoliotic curvature. A left-sided total hip arthroplasty is present. IMPRESSION: 1. No acute intrathoracic, abdominal, or pelvic injury. 2. Partially the/proximal left humerus fracture. Electronically Signed   By: Prudencio Pair M.D.   On: 01/15/2020 23:41   CT Cervical Spine Wo Contrast  Result Date: 01/15/2020 CLINICAL DATA:  Fall with laceration to the forehead. EXAM: CT HEAD WITHOUT CONTRAST CT CERVICAL SPINE WITHOUT CONTRAST TECHNIQUE: Multidetector CT imaging of the head and cervical spine was performed following the standard protocol without intravenous contrast. Multiplanar CT image reconstructions of the cervical spine were also generated. COMPARISON:  08/12/2016 FINDINGS: CT HEAD FINDINGS Brain: No evidence of old or acute infarction, mass lesion, hemorrhage, hydrocephalus or extra-axial collection. Vascular: Slight dolichoectasia of the major vessels at the base of the brain. Skull: No skull fracture. Sinuses/Orbits: Clear/normal Other: Left forehead scalp injury. No sign of radiopaque foreign object. CT CERVICAL SPINE FINDINGS Alignment: Chronic kyphotic deformity in the C3 through C5 region. Skull base and vertebrae: Congenital failure of separation from C2 through C3 and from C5 into the thoracic region consistent with Klippel-Feil deformity. Soft tissues and spinal canal: Negative Disc levels: Foramen magnum sufficiently patent. C1-2 level is unremarkable. Congenital failure of segmentation at C2-3 but with sufficient patency of  the canal and foramina. At C3-4, there is chronic degenerative spondylosis with disc space narrowing, vacuum phenomenon, endplate osteophytes and facet arthropathy on the right. There is canal stenosis and right foraminal stenosis that could be symptomatic At C4-5, there is an appearance of acquired fusion. There is been previous posterior decompression. Chronic anterior canal  encroachment secondary to the endplate osteophytes. C5 through thoracic region. Congenital failure of segmentation with apparent sufficient patency of the canal and foramina. Upper chest: Negative Other: None IMPRESSION: Head CT: Normal except for a left forehead scalp injury. Cervical spine CT: No acute or traumatic finding. 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 Acquired fusion at the C4-5 level. Chronic degenerative spondylosis at C3-4. Right facet arthropathy. Canal stenosis and right foraminal stenosis as seen previously. Electronically Signed   By: Paulina Fusi M.D.   On: 01/15/2020 21:21   CT ABDOMEN PELVIS W CONTRAST  Result Date: 01/15/2020 CLINICAL DATA:  Fall, widened mediastinum EXAM: CT CHEST, abdomen and pelvis WITH CONTRAST TECHNIQUE: Multidetector CT imaging of the chest was performed during intravenous contrast administration. CONTRAST:  OMNIPAQUE IOHEXOL 300 MG/ML  SOLN COMPARISON:  None. FINDINGS: Cardiovascular: Normal heart size. No significant pericardial fluid/thickening. There is a tortuous descending intrathoracic aorta present. Great vessels are normal in course and caliber. No evidence of acute thoracic aortic injury. No central pulmonary emboli. Mediastinum/Nodes: No pneumomediastinum. No mediastinal hematoma. Unremarkable esophagus. No axillary, mediastinal or hilar lymphadenopathy. Lungs/Pleura:Minimal bibasilar dependent atelectasis is noted. No pneumothorax. No pleural effusion. Musculoskeletal: Partially visualized proximal left humerus fracture is seen. Abdomen/pelvis: Hepatobiliary: Homogeneous hepatic attenuation without traumatic injury. No focal lesion. Gallbladder physiologically distended, no calcified stone. No biliary dilatation. Pancreas: No evidence for traumatic injury. Portions are partially obscured by adjacent bowel loops and paucity of intra-abdominal fat. No ductal dilatation or inflammation. Spleen:  Homogeneous attenuation without traumatic injury. Normal in size. Adrenals/Urinary Tract: No adrenal hemorrhage. Scattered congenital absence or nephrectomy of the right kidney is seen. There is areas of cortical scarring throughout the left kidney. No evidence or renal injury. Ureters are well opacified proximal through mid portion. Bladder is physiologically distended with mild diffuse bladder wall thickening. Stomach/Bowel: Suboptimally assessed without enteric contrast, allowing for this, no evidence of bowel injury. Stomach physiologically distended. There are no dilated or thickened small or large bowel loops. Moderate stool burden. No evidence of mesenteric hematoma. No free air free fluid. Surgical clips are seen within the right lower quadrant. Vascular/Lymphatic: No acute vascular injury. The abdominal aorta and IVC are intact. No evidence of retroperitoneal, abdominal, or pelvic adenopathy. Reproductive: No acute abnormality. Other: No focal contusion or abnormality of the abdominal wall. Musculoskeletal: No acute fracture of the lumbar spine or bony pelvis. Posterior Peri to rod fixation seen throughout the thoracolumbar spine with a dextroconvex scoliotic curvature. A left-sided total hip arthroplasty is present. IMPRESSION: 1. No acute intrathoracic, abdominal, or pelvic injury. 2. Partially the/proximal left humerus fracture. Electronically Signed   By: Jonna Clark M.D.   On: 01/15/2020 23:41   CT L-SPINE NO CHARGE  Result Date: 01/15/2020 CLINICAL DATA:  Fall EXAM: CT LUMBAR SPINE WITHOUT CONTRAST TECHNIQUE: Multidetector CT imaging of the lumbar spine was performed without intravenous contrast administration. Multiplanar CT image reconstructions were also generated. COMPARISON:  03/16/2018 FINDINGS: Segmentation: 5 lumbar type vertebrae. Alignment: Normal. Vertebrae: Unchanged appearance of long segment posterior fusion with instrumentation. No perihardware lucency to indicate loosening. There  is no acute fracture. Paraspinal  and other soft tissues: Please see dedicated report for CT abdomen pelvis Disc levels: T11-12: Unremarkable T12-L1: Unremarkable. L1-2: Unremarkable. L2-3: Unremarkable. L3-4: Postsurgical changes without spinal canal stenosis. No neural foraminal stenosis. L4-5: Postsurgical changes.  No spinal canal stenosis. L5-S1: Unremarkable. IMPRESSION: Solid fusion the lower thoracic spine to the sacroiliac joints. No spinal canal or neural foraminal stenosis. No acute abnormality. Electronically Signed   By: Deatra Robinson M.D.   On: 01/15/2020 23:41   DG Shoulder Left  Result Date: 01/15/2020 CLINICAL DATA:  Fall EXAM: LEFT SHOULDER - 2+ VIEW COMPARISON:  None. FINDINGS: Acute mildly comminuted fracture involving the left humeral neck and greater tuberosity. No humeral head dislocation. The [facility] joint appears intact. IMPRESSION: Acute mildly comminuted fracture involving the left humeral neck and greater tuberosity without significant displacement or angulation. Electronically Signed   By: Jasmine Pang M.D.   On: 01/15/2020 21:43   DG Humerus Left  Result Date: 01/15/2020 CLINICAL DATA:  Fall EXAM: LEFT HUMERUS - 2+ VIEW COMPARISON:  None. FINDINGS: Acute mildly comminuted fracture involving the left humeral neck and greater tuberosity. Shaft and distal humerus appear intact. No humeral head dislocation IMPRESSION: Acute mildly comminuted fracture involving the proximal left humerus. Electronically Signed   By: Jasmine Pang M.D.   On: 01/15/2020 21:45   I was able to independently reviewed the x-rays of her left shoulder and showed the x-rays to her and her husband.  She has a nondisplaced three-part proximal humerus fracture.  This is nondisplaced and in good alignment that is acceptable.  I did review the CT scan really shows her pelvis to assess the left hip replacement and it shows that this is intact.  PMFS History: Patient Active Problem List   Diagnosis Date Noted  .  Status post total replacement of left hip 06/14/2018  . Unilateral primary osteoarthritis, left hip 04/26/2018  . Epicondylitis elbow, medial 12/17/2011  . Lateral epicondylitis 01/09/2011  . BURSITIS, HIP 07/22/2010  . LEG PAIN, RIGHT 05/22/2010  . UNEQUAL LEG LENGTH 05/22/2010  . SCOLIOSIS, LUMBAR SPINE 05/22/2010  . ABNORMALITY OF GAIT 05/22/2010   Past Medical History:  Diagnosis Date  . Anxiety   . Asthma   . Chronic lower back pain   . Dysrhythmia    pt got Tachy last time under anesthesia  . High cholesterol   . History of blood transfusion    "related to surgeries" (06/14/2018)  . Hypothyroidism   . Kidney disease, chronic, stage II (GFR 60-89 ml/min)    "one of my kidneys is in my abdomen" (06/14/2018)  . Migraine    "daily-weekly" (06/14/2018)  . Neuromuscular disorder (HCC)    neuropathy in legs from back surgery  . PONV (postoperative nausea and vomiting)   . Scoliosis     History reviewed. No pertinent family history.  Past Surgical History:  Procedure Laterality Date  . ABDOMINAL HYSTERECTOMY  1988  . BACK SURGERY    . BLADDER AUGMENTATION  1988  . HERNIA REPAIR    . JOINT REPLACEMENT    . KNEE ARTHROSCOPY Right   . SPINAL FUSION  2008   "w/harrington rods, etc"  . TOTAL HIP ARTHROPLASTY Left 06/14/2018  . TOTAL HIP ARTHROPLASTY Left 06/14/2018   Procedure: LEFT TOTAL HIP ARTHROPLASTY ANTERIOR APPROACH;  Surgeon: Kathryne Hitch, MD;  Location: [facility] OR;  Service: Orthopedics;  Laterality: Left;   Social History   Occupational History  . Not on file  Tobacco Use  . Smoking status: Never Smoker  .  Smokeless tobacco: Never Used  Substance and Sexual Activity  . Alcohol use: Yes    Comment: 06/14/2018 "maybe 1 bottle of wine/year; if that"  . Drug use: Never  . Sexual activity: Not Currently

## 2020-01-16 NOTE — ED Notes (Signed)
Patient verbalizes understanding of discharge instructions. Opportunity for questioning and answers were provided. Armband removed by staff, pt discharged from ED via wheelchair.  

## 2020-01-24 ENCOUNTER — Encounter: Payer: Self-pay | Admitting: Orthopaedic Surgery

## 2020-01-24 ENCOUNTER — Other Ambulatory Visit: Payer: Self-pay

## 2020-01-24 ENCOUNTER — Ambulatory Visit (INDEPENDENT_AMBULATORY_CARE_PROVIDER_SITE_OTHER): Payer: 59

## 2020-01-24 ENCOUNTER — Ambulatory Visit: Payer: 59 | Admitting: Orthopaedic Surgery

## 2020-01-24 DIAGNOSIS — S42292D Other displaced fracture of upper end of left humerus, subsequent encounter for fracture with routine healing: Secondary | ICD-10-CM

## 2020-01-24 DIAGNOSIS — S42292A Other displaced fracture of upper end of left humerus, initial encounter for closed fracture: Secondary | ICD-10-CM

## 2020-01-24 MED ORDER — OXYCODONE HCL 5 MG PO TABS: 5.0000 mg | ORAL_TABLET | ORAL | 0 refills | Status: DC | PRN

## 2020-01-24 NOTE — Progress Notes (Signed)
The patient comes in today just over a week out from a proximal humerus fracture of her left shoulder.  This was nondisplaced but a three-part fracture.  She has been compliant with sling.  She is feeling somewhat better but it still hurts quite a bit.  She is taking oxycodone for pain.  She is 59 years old.  On examination of the left shoulder is clinically well located.  There is bruising on the shoulder.  Distally her motor and sensory exam is normal.  A single AP view of the left shoulder obtained shows the fracture is still anatomically aligned with no displacement.  She will continue her sling with coming in and out of it just to work on elbow and wrist motion but no abduction of the shoulder.  We will see her back in 4 weeks with an internal and external rotated views of the left shoulder.  I will send in some more oxycodone for her as well.

## 2020-02-26 ENCOUNTER — Encounter: Payer: Self-pay | Admitting: Orthopaedic Surgery

## 2020-02-26 ENCOUNTER — Ambulatory Visit: Payer: 59 | Admitting: Orthopaedic Surgery

## 2020-02-26 ENCOUNTER — Other Ambulatory Visit: Payer: Self-pay

## 2020-02-26 ENCOUNTER — Ambulatory Visit (INDEPENDENT_AMBULATORY_CARE_PROVIDER_SITE_OTHER): Payer: No Typology Code available for payment source

## 2020-02-26 DIAGNOSIS — S42292D Other displaced fracture of upper end of left humerus, subsequent encounter for fracture with routine healing: Secondary | ICD-10-CM | POA: Diagnosis not present

## 2020-02-26 DIAGNOSIS — S42292A Other displaced fracture of upper end of left humerus, initial encounter for closed fracture: Secondary | ICD-10-CM

## 2020-02-26 NOTE — Progress Notes (Signed)
The patient is almost 6 weeks out from mechanical fall in which she sustained a nondisplaced 3 part left shoulder proximal humerus fracture.  She is having still quite a bit of pain.  She is in pain management.  She is only 59 years old.  She is having trouble sleeping at night.  She is now out of her sling.  On exam her shoulder is clinically well located with the left shoulder.  She has weakness with external rotation and abduction.  2 views left shoulder show interval healing from the previous films.  The fracture remains nondisplaced.  At this point I want her to work on some exercises on her own with shoulder abduction and forward flexion as well as wall crawls.  We will see her back in 4 weeks to see how she is doing overall.  At that visit I would like the standard 3 views of the left shoulder.  We will then consider formal physical therapy if there continues to be signs of good bone healing.

## 2020-03-25 ENCOUNTER — Ambulatory Visit (INDEPENDENT_AMBULATORY_CARE_PROVIDER_SITE_OTHER): Payer: No Typology Code available for payment source

## 2020-03-25 ENCOUNTER — Encounter: Payer: Self-pay | Admitting: Orthopaedic Surgery

## 2020-03-25 ENCOUNTER — Other Ambulatory Visit: Payer: Self-pay

## 2020-03-25 ENCOUNTER — Ambulatory Visit: Payer: No Typology Code available for payment source | Admitting: Orthopaedic Surgery

## 2020-03-25 VITALS — Ht 62.0 in | Wt 148.0 lb

## 2020-03-25 DIAGNOSIS — S42292D Other displaced fracture of upper end of left humerus, subsequent encounter for fracture with routine healing: Secondary | ICD-10-CM | POA: Diagnosis not present

## 2020-03-25 NOTE — Addendum Note (Signed)
Addended by: Rogers Seeds on: 03/25/2020 09:34 AM   Modules accepted: Orders

## 2020-03-25 NOTE — Progress Notes (Signed)
The patient is now about 10 weeks status post a mechanical fall in which she sustained a left shoulder three-part proximal humerus fracture.  This was a nondisplaced fracture.  We have treated her conservatively.  She is doing well overall.  We have not put her through any therapy yet while we are waiting for the fracture to heal.  On exam her shoulder is clinically located on the left side.  I can gently put her through external rotation as well as abduction and forward flexion.  There is some stiffness of the shoulder but overall it does look improved.  3 views left shoulder obtained and show the fracture remains nondisplaced and there is significant interval healing.  The shoulder is well located.  At this standpoint we will set her up for outpatient physical therapy so they can work on gentle range of motion of the left shoulder as well as some strengthening.  There is no restrictions from a therapy standpoint and will let pain be to guide in terms of her mobility of the shoulder.  She can stop her sling as well.  I would like to see her back about 6 weeks after course of therapy with a final 3 views of the left shoulder.  All questions and concerns were answered and addressed.

## 2020-03-26 ENCOUNTER — Encounter: Payer: Self-pay | Admitting: Cardiovascular Disease

## 2020-03-26 ENCOUNTER — Ambulatory Visit: Payer: No Typology Code available for payment source | Admitting: Cardiovascular Disease

## 2020-03-26 VITALS — BP 114/73 | HR 72 | Ht 62.5 in | Wt 146.8 lb

## 2020-03-26 DIAGNOSIS — E7800 Pure hypercholesterolemia, unspecified: Secondary | ICD-10-CM | POA: Diagnosis not present

## 2020-03-26 DIAGNOSIS — R079 Chest pain, unspecified: Secondary | ICD-10-CM | POA: Diagnosis not present

## 2020-03-26 DIAGNOSIS — Q6210 Congenital occlusion of ureter, unspecified: Secondary | ICD-10-CM

## 2020-03-26 DIAGNOSIS — R0609 Other forms of dyspnea: Secondary | ICD-10-CM

## 2020-03-26 DIAGNOSIS — R0600 Dyspnea, unspecified: Secondary | ICD-10-CM | POA: Diagnosis not present

## 2020-03-26 DIAGNOSIS — N183 Chronic kidney disease, stage 3 unspecified: Secondary | ICD-10-CM | POA: Insufficient documentation

## 2020-03-26 DIAGNOSIS — N1831 Chronic kidney disease, stage 3a: Secondary | ICD-10-CM

## 2020-03-26 DIAGNOSIS — N319 Neuromuscular dysfunction of bladder, unspecified: Secondary | ICD-10-CM | POA: Insufficient documentation

## 2020-03-26 DIAGNOSIS — M419 Scoliosis, unspecified: Secondary | ICD-10-CM | POA: Insufficient documentation

## 2020-03-26 DIAGNOSIS — R55 Syncope and collapse: Secondary | ICD-10-CM | POA: Diagnosis not present

## 2020-03-26 NOTE — Patient Instructions (Signed)
Medication Instructions: No changes  * If you need a refill on your cardiac medications before your next appointment, please call your pharmacy.   Labwork: None ordered If you have labs (blood work) drawn today and your tests are completely normal, you will receive your results only by: MyChart Message (if you have MyChart) OR A paper copy in the mail If you have any lab test that is abnormal or we need to change your treatment, we will call you to review the results.  Testing/Procedures: Your physician has recommended that you have a loop recorder implant. Please follow the instructions below, located under the special instructions section.  Follow-Up: Your physician recommends that you schedule a wound check appointment 10-14 days, after your procedure on 04/08/20, with the device clinic.  Follow-Up: At [facility], you and your health needs are our priority.  As part of our continuing mission to provide you with exceptional heart care, we have created designated Provider Care Teams.  These Care Teams include your primary Cardiologist (physician) and Advanced Practice Providers (APPs -  Physician Assistants and Nurse Practitioners) who all work together to provide you with the care you need, when you need it.  Your next appointment:   6 month(s)  The format for your next appointment:   In Person  Provider:   Dr. Royann Shivers  Special Instructions:   [facility] at [facility]  [address]  [place], [place] [address]  Phone: [phone] Fax: [phone]   You are scheduled for a Loop Recorder Implant on 04/08/20  at 1 pm with Dr. Royann Shivers. This will take place at [address] [facility], [address].    Please arrive at your appointment 15-20 minutes early.    You do not need to be fasting.    The procedure is performed with local anesthesia. You will not receive sedatives nor will an IV be placed.    Wash your chest and neck with the surgical  soap the evening before and the morning of your procedure. Please following the washing instructions provided.    As with all surgical implants, there is a small risk of infection. If an infection occurs, the device will be removed. To help reduce the risk, please use the surgical scrub provided. Additional antiseptic precautions will be taken at the time of the procedure.    Please bring your insurance cards.   *Please note that scheduled loop recorder implants may need to be rescheduled if Dr. Royann Shivers has a procedure urgently added on at the hospital   Preparing for the Procedure  Before the procedure, you can play an important role. Because skin is not sterile, your skin needs to be as free of germs as possible. You can reduce the number of germs on your skin by washing with CHG (chlorhexidine gluconate) Soap before the procedure. CHG is an antiseptic cleaner which kills germs and bonds with the skin to continue killing germs even after washing.  Please do not use if you have an allergy to CHG or antibacterial soaps. If your skin becomes reddened/irritated, STOP using the CHG.  DO NOT SHAVE (including legs and underarms) for at least 48 hours prior to first CHG shower. It is OK to shave your face.  Please follow these instructions carefully: 1. Shower the night before the procedure and the morning of with CHG Soap. 2. If you chose to wash your hair, wash your hair first as usual with your normal shampoo/conditioner. 3. After you shampoo/condition, rinse  you hair and body thoroughly to remove shampoo/conditioner. 4. Use CHG as you would any other liquid soap. You can apply CHG directly to the skin and wash gently with a loofah or a clean washcloth. 5. Apply the CHG Soap to your body ONLY FROM THE NECK DOWN. Do not use on open wounds or open sores. Avoid contact with your eyes, ears, mouth, and genitals (private parts).  6. Wash thoroughly, paying special attention to the area where your  surgery will be performed. 7. Thoroughly rinse your body with warm water from the neck down. 8. DO NOT shower/wash with your normal soap after using and rinsing off the CHG Soap. 9. Pat yourself dry with a clean towel. 10. Wear clean pajamas to bed. 11. Place clean sheets on your bed the night of your first shower and do not sleep with pets..  Day of Surgery: Shower with the CHG Soap following the instructions listed above. DO NOT apply deodorants or lotions.

## 2020-03-26 NOTE — Progress Notes (Signed)
Cardiology consultation note:    Date:  03/26/2020   ID:  Devan Babino, DOB 1961/07/17, MRN [id number]  PCP:  Laurann Montana, MD  [facility] Cardiologist:  No primary care provider on file.  [facility] Electrophysiologist:  None   Referring MD: Laurann Montana, MD   Chief Complaint  Patient presents with  . Loss of Consciousness  Nicole Morris is a 59 y.o. female who is being seen today for the evaluation of syncope at the request of Laurann Montana, MD.   History of Present Illness:    Maicie Vanderloop is a 59 y.o. 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 Marisue Humble),  treated hypothyroidism, hypercholesterolemia on statin, history of left total hip replacement and limbs of unequal length, presenting for evaluation after an episode of syncope.  She was walking her new [place] shepherd a couple of months ago May 10.  She remembers bringing the dog to heal since an unfamiliar dog was approaching and she remembers being impressed with how well he behaved.  The next thing she remembers she was on the ground to bleeding from a gash in her head and had a fractured left humerus.  She does not remember losing consciousness or falling.  The medical records state that she accidentally tripped over her dog, but she is confident this did not occur.  Since then she has had several more episodes of dizziness and a couple of less serious falls.  She has stopped taking cyclobenzaprine and this may have improved her dizziness.  She also complains of exertional dyspnea which is inconsistently present.  Some days she has a lot of difficulty climbing the 17 stairs in her home, on other days this is not a problem.  She does not have chest pain at rest or with activity.  Once or twice a month she will have a sensation of  irregular heart rhythm which she describes as "weird" and this is associated with a severe sensation of heartburn in the retrosternal area.  The symptoms last for about 5 to 10 minutes and resolve spontaneously.  She does not get similar "heartburn" during physical activity.  She denies orthopnea, PND, leg edema, focal neurological complaints.  She has had 3 separate back surgeries for scoliosis and has a lot of hardware in her thoracic and lumbar spine.  She has had a total of 23 surgeries including multiple procedures for congenital ureteral drainage problems and need for bladder augmentation.  The last surgery was performed in 1988.  She has a neurogenic bladder and requires self-catheterization.  She has been on treatment with rosuvastatin for hypercholesterolemia and her most recent LDL cholesterol in April of this year was 96.  She bears a diagnosis of CKD stage III.  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  Plan creatinine seems to be around 1.3 and stable.  Released by Dr. Marisue Humble in 2019 but asked her to contact him again should her creatinine reach 1.5 or higher.  Her father Onalee Hua was my patient.  He passed away from liver cancer not long ago.  Her mother Eber Jones is also my patient.  She has interstitial lung disease and is suspected to have coronary disease.  Currently receiving treatment for lung cancer.  Past Medical History:  Diagnosis Date  . Anxiety   . Asthma   . Chronic lower back pain   . Dysrhythmia    pt got Tachy last time under anesthesia  . High cholesterol   . History of blood transfusion    "related to surgeries" (06/14/2018)  . Hypothyroidism   . Kidney disease, chronic, stage II (GFR 60-89 ml/min)    "one of my kidneys is in my abdomen" (06/14/2018)  . Migraine    "daily-weekly" (06/14/2018)  . Neuromuscular disorder (HCC)     neuropathy in legs from back surgery  . PONV (postoperative nausea and vomiting)   . Scoliosis     Past Surgical History:  Procedure Laterality Date  . ABDOMINAL HYSTERECTOMY  1988  . BACK SURGERY    . BLADDER AUGMENTATION  1988  . HERNIA REPAIR    . JOINT REPLACEMENT    . KNEE ARTHROSCOPY Right   . SPINAL FUSION  2008   "w/harrington rods, etc"  . TOTAL HIP ARTHROPLASTY Left 06/14/2018  . TOTAL HIP ARTHROPLASTY Left 06/14/2018   Procedure: LEFT TOTAL HIP ARTHROPLASTY ANTERIOR APPROACH;  Surgeon: Kathryne Hitch, MD;  Location: [facility] OR;  Service: Orthopedics;  Laterality: Left;    Current Medications: Current Meds  Medication Sig  . albuterol (PROVENTIL HFA;VENTOLIN HFA) 108 (90 BASE) MCG/ACT inhaler Inhale 2 puffs into the lungs every 6 (six) hours as needed for wheezing or shortness of breath.  Marland Kitchen buPROPion (WELLBUTRIN XL) 300 MG 24 hr tablet Take 300 mg by mouth daily.   . butalbital-acetaminophen-caffeine (FIORICET, ESGIC) 50-325-40 MG tablet Take 1 tablet by mouth daily as needed for headache.  . diclofenac sodium (VOLTAREN) 1 % GEL Apply 2 g topically daily as needed (joint pain).   Marland Kitchen gabapentin (NEURONTIN) 100 MG capsule Take 100-300 mg by mouth at bedtime as needed for pain.  Marland Kitchen levothyroxine (SYNTHROID, LEVOTHROID) 50 MCG tablet Take 50 mcg by mouth daily before breakfast.  . LORazepam (ATIVAN) 0.5 MG tablet Take 0.25-0.5 mg by mouth every 8 (eight) hours as needed for anxiety.  . ondansetron (ZOFRAN ODT) 4 MG disintegrating tablet Take 1 tablet (4 mg total) by mouth every 8 (eight) hours as needed for nausea or vomiting.  . rosuvastatin (CRESTOR) 5 MG tablet Take 5 mg by mouth daily.   . sertraline (ZOLOFT) 100 MG tablet Take 100 mg by mouth every evening.   . tapentadol (NUCYNTA ER) 50 MG 12 hr tablet Take 50 mg by mouth daily as needed (pain).   . [DISCONTINUED] aspirin 81 MG chewable tablet Chew 1 tablet (81 mg total) by mouth 2 (two) times daily.  .  [DISCONTINUED] cefUROXime (CEFTIN) 500 MG tablet Take 500 mg by mouth 2 (two) times daily.  . [DISCONTINUED] EPINEPHrine (EPIPEN) 0.3 mg/0.3 mL IJ SOAJ injection Inject 0.3 mLs (0.3 mg total) into the muscle as needed. (Patient taking differently: Inject 0.3 mg into the muscle as needed for anaphylaxis. )  . [DISCONTINUED] oxyCODONE (OXY IR/ROXICODONE) 5 MG immediate release tablet Take 1-2 tablets (5-10 mg total) by mouth every 4 (four) hours as needed for moderate pain (pain score 4-6).  . [DISCONTINUED] Vitamin D, Ergocalciferol, (DRISDOL) 50000 units CAPS capsule TAKE ONE CAPSULE BY MOUTH EVERY 7 DAYS     Allergies:   Codeine and Mushroom extract complex   Social History   Socioeconomic History  . Marital status: Married    Spouse name: Not on file  . Number of children: Not on file  . Years of education: Not on file  .  Highest education level: Not on file  Occupational History  . Not on file  Tobacco Use  . Smoking status: Never Smoker  . Smokeless tobacco: Never Used  Vaping Use  . Vaping Use: Never used  Substance and Sexual Activity  . Alcohol use: Yes    Comment: 06/14/2018 "maybe 1 bottle of wine/year; if that"  . Drug use: Never  . Sexual activity: Not Currently  Other Topics Concern  . Not on file  Social History Narrative  . Not on file   Social Determinants of Health   Financial Resource Strain:   . Difficulty of Paying Living Expenses:   Food Insecurity:   . Worried About Programme researcher, broadcasting/film/videounning Out of Food in the Last Year:   . Baristaan Out of Food in the Last Year:   Transportation Needs:   . Freight forwarderLack of Transportation (Medical):   Marland Kitchen. Lack of Transportation (Non-Medical):   Physical Activity:   . Days of Exercise per Week:   . Minutes of Exercise per Session:   Stress:   . Feeling of Stress :   Social Connections:   . Frequency of Communication with Friends and Family:   . Frequency of Social Gatherings with Friends and Family:   . Attends Religious Services:   . Active  Member of Clubs or Organizations:   . Attends BankerClub or Organization Meetings:   Marland Kitchen. Marital Status:      Family History: The patient's father had atrial fibrillation and passed away from metastatic liver cancer.  Her mother has interstitial lung disease, lung cancer and suspected coronary disease. ROS:   Please see the history of present illness.    All other systems reviewed and are negative.  EKGs/Labs/Other Studies Reviewed:    The following studies were reviewed today: Notes from Dr. Laurann Montanaynthia White  EKG:  EKG is  ordered today.  The ekg ordered today demonstrates normal sinus rhythm and is a completely normal tracing, QTC 439 ms.  Recent Labs: 01/15/2020: ALT 13; BUN 13; Creatinine, Ser 1.21; Hemoglobin 12.4; Platelets 205; Potassium 3.8; Sodium 139  Recent Lipid Panel No results found for: CHOL, TRIG, HDL, CHOLHDL, VLDL, LDLCALC, LDLDIRECT  Physical Exam:    VS:  BP 114/73   Pulse 72   Ht 5' 2.5" (1.588 m)   Wt 146 lb 12.8 oz (66.6 kg)   SpO2 98%   BMI 26.42 kg/m     Wt Readings from Last 3 Encounters:  03/26/20 146 lb 12.8 oz (66.6 kg)  03/25/20 148 lb (67.1 kg)  01/15/20 148 lb (67.1 kg)     GEN:  Well nourished, well developed in no acute distress HEENT: Normal NECK: No JVD; No carotid bruits LYMPHATICS: No lymphadenopathy CARDIAC: RRR, no murmurs, rubs, gallops RESPIRATORY:  Clear to auscultation without rales, wheezing or rhonchi  ABDOMEN: Soft, non-tender, non-distended MUSCULOSKELETAL: She has prominent scoliosis.  Walks with a limp due to unequal lower extremities.  No edema; No deformity  SKIN: Warm and dry NEUROLOGIC:  Alert and oriented x 3 PSYCHIATRIC:  Normal affect   ASSESSMENT:    1. Syncope, unspecified syncope type   2. Exertional dyspnea   3. Chest pain at rest   4. Hypercholesterolemia   5. Stage 3a chronic kidney disease   6. Scoliosis of thoracolumbar spine, unspecified scoliosis type   7. Congenital ureteral obstruction   8. Neurogenic  bladder    PLAN:    In order of problems listed above:  1. Syncope: The absence of any prodromal symptoms suggest  true arrhythmic syncope.  She has had further problems with symptomatic dizziness without an obvious cause.  She has occasional palpitations.  However these only occur once or twice a month at the most.  I have recommended an implantable loop recorder as the highest yield procedure to identify the cause for these events. This procedure has been fully reviewed with the patient and informed consent has been obtained.  Reviewed the procedural details, expected healing time, risk for infection, remote transmission follow-up, etc. 2. Shortness of breath: She has exertional dyspnea but not chest discomfort.  This is not present consistently.  Normal clinical cardiovascular exam.  Consider echocardiogram, but suspect noncardiac etiology.  Likely to have certain abnormalities of chest wall dynamics due to her prominent scoliosis. 3. Resting chest discomfort: This appears to be associated with palpitations.  It does not occur during physical activity.  Reviewed her CT of the chest abdomen and pelvis from a couple of months ago.  I cannot identify any evidence of calcification in the coronary artery distribution or for that matter in any of the segments or the aorta or branches of the aorta all the way down to the femoral arteries.  Suspicion for severe vascular disease/coronary disease appears to be low.   Coronary risk factors are limited to mild hypercholesterolemia which is well treated with a statin. 4. HLP: Target LDL cholesterol less than 956 since she does not have established vascular disease.  Has achieved data on the current dose of statin. 5. CKD 3: Baseline creatinine 1.2-1.3, GFR 45-50; known congenital abnormalities of the urinary system with multiple surgical procedures and neurogenic bladder requiring self-catheterization.  Seems to have stable renal function for several years.   Nevertheless, would avoid nephrotoxic agents including unnecessary contrast based procedures.   Medication Adjustments/Labs and Tests Ordered: Current medicines are reviewed at length with the patient today.  Concerns regarding medicines are outlined above.  Orders Placed This Encounter  Procedures  . EKG 12-Lead   No orders of the defined types were placed in this encounter.   Patient Instructions  Medication Instructions: No changes  * If you need a refill on your cardiac medications before your next appointment, please call your pharmacy.   Labwork: None ordered If you have labs (blood work) drawn today and your tests are completely normal, you will receive your results only by: MyChart Message (if you have MyChart) OR A paper copy in the mail If you have any lab test that is abnormal or we need to change your treatment, we will call you to review the results.  Testing/Procedures: Your physician has recommended that you have a loop recorder implant. Please follow the instructions below, located under the special instructions section.  Follow-Up: Your physician recommends that you schedule a wound check appointment 10-14 days, after your procedure on 04/08/20, with the device clinic.  Follow-Up: At [facility], you and your health needs are our priority.  As part of our continuing mission to provide you with exceptional heart care, we have created designated Provider Care Teams.  These Care Teams include your primary Cardiologist (physician) and Advanced Practice Providers (APPs -  Physician Assistants and Nurse Practitioners) who all work together to provide you with the care you need, when you need it.  Your next appointment:   6 month(s)  The format for your next appointment:   In Person  Provider:   Dr. Royann Shivers  Special Instructions:   [facility] at [facility]  [address]  [address]  [place], [place] [address]  Phone: [phone]  Fax: [phone]   You are scheduled for a Loop Recorder Implant on 04/08/20  at 1 pm with Dr. Royann Shivers. This will take place at [address] [facility], [address].    Please arrive at your appointment 15-20 minutes early.    You do not need to be fasting.    The procedure is performed with local anesthesia. You will not receive sedatives nor will an IV be placed.    Wash your chest and neck with the surgical soap the evening before and the morning of your procedure. Please following the washing instructions provided.    As with all surgical implants, there is a small risk of infection. If an infection occurs, the device will be removed. To help reduce the risk, please use the surgical scrub provided. Additional antiseptic precautions will be taken at the time of the procedure.    Please bring your insurance cards.   *Please note that scheduled loop recorder implants may need to be rescheduled if Dr. Royann Shivers has a procedure urgently added on at the hospital   Preparing for the Procedure  Before the procedure, you can play an important role. Because skin is not sterile, your skin needs to be as free of germs as possible. You can reduce the number of germs on your skin by washing with CHG (chlorhexidine gluconate) Soap before the procedure. CHG is an antiseptic cleaner which kills germs and bonds with the skin to continue killing germs even after washing.  Please do not use if you have an allergy to CHG or antibacterial soaps. If your skin becomes reddened/irritated, STOP using the CHG.  DO NOT SHAVE (including legs and underarms) for at least 48 hours prior to first CHG shower. It is OK to shave your face.  Please follow these instructions carefully: 1. Shower the night before the procedure and the morning of with CHG Soap. 2. If you chose to wash your hair, wash your hair first as usual with your normal shampoo/conditioner. 3. After you shampoo/condition, rinse you hair and body  thoroughly to remove shampoo/conditioner. 4. Use CHG as you would any other liquid soap. You can apply CHG directly to the skin and wash gently with a loofah or a clean washcloth. 5. Apply the CHG Soap to your body ONLY FROM THE NECK DOWN. Do not use on open wounds or open sores. Avoid contact with your eyes, ears, mouth, and genitals (private parts).  6. Wash thoroughly, paying special attention to the area where your surgery will be performed. 7. Thoroughly rinse your body with warm water from the neck down. 8. DO NOT shower/wash with your normal soap after using and rinsing off the CHG Soap. 9. Pat yourself dry with a clean towel. 10. Wear clean pajamas to bed. 11. Place clean sheets on your bed the night of your first shower and do not sleep with pets..  Day of Surgery: Shower with the CHG Soap following the instructions listed above. DO NOT apply deodorants or lotions.      Signed, Thurmon Fair, MD  03/26/2020 5:58 PM    [facility]

## 2020-04-02 ENCOUNTER — Encounter: Payer: Self-pay | Admitting: *Deleted

## 2020-04-08 ENCOUNTER — Ambulatory Visit: Payer: No Typology Code available for payment source | Admitting: Cardiovascular Disease

## 2020-04-11 ENCOUNTER — Encounter: Payer: Self-pay | Admitting: Rehabilitative and Restorative Service Providers"

## 2020-04-11 ENCOUNTER — Ambulatory Visit (INDEPENDENT_AMBULATORY_CARE_PROVIDER_SITE_OTHER): Payer: No Typology Code available for payment source | Admitting: Rehabilitative and Restorative Service Providers"

## 2020-04-11 ENCOUNTER — Other Ambulatory Visit: Payer: Self-pay

## 2020-04-11 DIAGNOSIS — M25612 Stiffness of left shoulder, not elsewhere classified: Secondary | ICD-10-CM

## 2020-04-11 DIAGNOSIS — G8929 Other chronic pain: Secondary | ICD-10-CM

## 2020-04-11 DIAGNOSIS — M25512 Pain in left shoulder: Secondary | ICD-10-CM | POA: Diagnosis not present

## 2020-04-11 DIAGNOSIS — R293 Abnormal posture: Secondary | ICD-10-CM

## 2020-04-11 NOTE — Patient Instructions (Signed)
Access Code: [email or web address] URL: [email or web address] Date: 04/11/2020 Prepared by: Pauletta Browns  Exercises Standing Scapular Retraction - 5 x daily - 7 x weekly - 1 sets - 5 reps - 5 hold Supine Bilateral Shoulder Protraction - 2-3 x daily - 7 x weekly - 1 sets - 20 reps - 3 second hold Standing Shoulder Internal Rotation Stretch with Hands Behind Back - 5 x daily - 7 x weekly - 1 sets - 10 reps - 10 hold Supine Shoulder Flexion AAROM with Hands Clasped - 2-3 x daily - 7 x weekly - 1 sets - 10-20 reps - 10 seconds hold Seated Shoulder Flexion AAROM with Pulley Behind - 5 x daily - 7 x weekly - 1 sets - 10 reps - 10 seconds hold

## 2020-04-11 NOTE — Therapy (Signed)
[facility] Physical Therapy [address] [place], [place], [address] Phone: [phone]   Fax:  [phone]  Physical Therapy Evaluation  Patient Details  Name: Nicole Morris MRN: [id number] Date of Birth: Aug 24, 1961 Referring Provider (PT): Magnus Ivan   Encounter Date: 04/11/2020   PT End of Session - 04/11/20 1714    Visit Number 1    Number of Visits 16    PT Start Time 1515    PT Stop Time 1608    PT Time Calculation (min) 53 min    Activity Tolerance Patient tolerated treatment well    Behavior During Therapy [facility] for tasks assessed/performed           Past Medical History:  Diagnosis Date  . Anxiety   . Asthma   . Chronic lower back pain   . Dysrhythmia    pt got Tachy last time under anesthesia  . High cholesterol   . History of blood transfusion    "related to surgeries" (06/14/2018)  . Hypothyroidism   . Kidney disease, chronic, stage II (GFR 60-89 ml/min)    "one of my kidneys is in my abdomen" (06/14/2018)  . Migraine    "daily-weekly" (06/14/2018)  . Neuromuscular disorder (HCC)    neuropathy in legs from back surgery  . PONV (postoperative nausea and vomiting)   . Scoliosis     Past Surgical History:  Procedure Laterality Date  . ABDOMINAL HYSTERECTOMY  1988  . BACK SURGERY    . BLADDER AUGMENTATION  1988  . HERNIA REPAIR    . JOINT REPLACEMENT    . KNEE ARTHROSCOPY Right   . SPINAL FUSION  2008   "w/harrington rods, etc"  . TOTAL HIP ARTHROPLASTY Left 06/14/2018  . TOTAL HIP ARTHROPLASTY Left 06/14/2018   Procedure: LEFT TOTAL HIP ARTHROPLASTY ANTERIOR APPROACH;  Surgeon: Kathryne Hitch, MD;  Location: [facility] OR;  Service: Orthopedics;  Laterality: Left;    There were no vitals filed for this visit.    Subjective Assessment - 04/11/20 1709    Subjective Nicole Morris appears to have had a fall and fracture due to orthostatic hypotension.  She is cleared to start PT as the fracture has healed enough to work on L shoulder AROM  and strength.  Impairments are noted affecting her ability to perform most L UE ADLs (particularly with fixing her hair).    Pertinent History Previous lumbar spine surgery, L THA, R knee scope    Limitations Other (comment)   Reaching and overhead function with the L shoulder   Patient Stated Goals Improve AROM, strength and use of L shoulder    Currently in Pain? Yes    Pain Score 5     Pain Location Shoulder    Pain Orientation Left    Pain Descriptors / Indicators Tightness    Pain Type Chronic pain    Pain Onset More than a month ago    Pain Frequency Intermittent    Aggravating Factors  End range    Pain Relieving Factors Rest, but can't sleep on the L side    Effect of Pain on Daily Activities Limits ability to do hair and use L UE for most ADLs    Multiple Pain Sites No              [facility] PT Assessment - 04/11/20 0001      Assessment   Medical Diagnosis L proximal humerus fracture    Referring Provider (PT) Magnus Ivan    Onset Date/Surgical Date 01/16/20  Balance Screen   Has the patient fallen in the past 6 months Yes    How many times? 1    Has the patient had a decrease in activity level because of a fear of falling?  No    Is the patient reluctant to leave their home because of a fear of falling?  No      Prior Function   Level of Independence Independent      Cognition   Overall Cognitive Status Within Functional Limits for tasks assessed      ROM / Strength   AROM / PROM / Strength AROM;Strength      AROM   Overall AROM  Deficits    AROM Assessment Site Shoulder    Right/Left Shoulder Left;Right    Right Shoulder Flexion 145 Degrees    Right Shoulder Internal Rotation 60 Degrees    Right Shoulder External Rotation 95 Degrees    Right Shoulder Horizontal  ADduction 30 Degrees    Left Shoulder Flexion 125 Degrees    Left Shoulder Internal Rotation 40 Degrees    Left Shoulder External Rotation 70 Degrees    Left Shoulder Horizontal ADduction 25  Degrees      Strength   Overall Strength Deficits    Strength Assessment Site Shoulder    Right/Left Shoulder Left;Right    Right Shoulder Internal Rotation 4+/5    Right Shoulder External Rotation 4/5    Left Shoulder Internal Rotation 4+/5    Left Shoulder External Rotation 3+/5                      Objective measurements completed on examination: See above findings.       [facility] Adult PT Treatment/Exercise - 04/11/20 0001      Posture/Postural Control   Posture/Postural Control Postural limitations    Postural Limitations Rounded Shoulders;Decreased lumbar lordosis;Forward head      Exercises   Exercises Shoulder      Shoulder Exercises: Supine   Protraction Strengthening;20 reps   3 seconds (weights next visit)   Flexion AROM;10 reps   10 seconds palms in protract 1st     Shoulder Exercises: Seated   Retraction Strengthening;10 reps   5 seconds (shoulder blade pinches)     Shoulder Exercises: Standing   Internal Rotation AROM;10 reps   10 seconds thumb up the back     Shoulder Exercises: Pulleys   Flexion Other (comment)   10X 10 seconds   Scaption Other (comment)   10X 10 seconds                 PT Education - 04/11/20 1713    Education Details HEP, focus on AROM early with PT due to difficulty getting AROM after 3-4 months post-fracture.    Person(s) Educated Patient    Methods Explanation;Demonstration;Tactile cues;Verbal cues;Handout    Comprehension Returned demonstration;Need further instruction;Verbal cues required;Verbalized understanding;Tactile cues required               PT Long Term Goals - 04/11/20 1717      PT LONG TERM GOAL #1   Title Nicole Morris will report L shoulder pain consistently 0-3/10 on the Numeric Pain rating Scale.    Baseline As high as 5-7/10    Time 8    Period Weeks    Status New    Target Date 06/07/20      PT LONG TERM GOAL #2   Title Nicole Morris will improve L shoulder AROM for flexion  to 150; ER to 80; IR to  50 and HA to 30 degrees.    Baseline 125; 70; 40; 25 respectively    Time 8    Period Weeks    Status New    Target Date 06/07/20      PT LONG TERM GOAL #3   Title Improve L shoulder strength to at least 4+/5 for IR/ER.    Baseline See objective    Time 8    Period Weeks    Status New    Target Date 06/07/20      PT LONG TERM GOAL #4   Title Nicole Morris will be able to manage her hair without L shoulder restrictions at DC.    Baseline Unable to do so (relies on husband).    Time 8    Period Weeks    Status New    Target Date 06/07/20      PT LONG TERM GOAL #5   Title Nicole Morris will be independent with her DC HEP.    Time 8    Period Weeks    Status New    Target Date 06/07/20                  Plan - 04/11/20 1714    Clinical Impression Statement Nicole Morris is now able to start PT due to appropriate healing of her L shoulder proximal humerus fracture.  AROM and strength are limited and she would like to be able to do her hair and use her L arm with all ADLs (currently limited).    Examination-Activity Limitations Bathing;Dressing;Hygiene/Grooming;Bed Mobility;Sleep;Reach Overhead;Carry    Stability/Clinical Decision Making Stable/Uncomplicated    Clinical Decision Making Low    Rehab Potential Good    PT Frequency Other (comment)   1-2X/week   PT Duration 8 weeks    PT Treatment/Interventions ADLs/Self Care Home Management;Cryotherapy;Moist Heat;Therapeutic activities;Neuromuscular re-education;Balance training;Therapeutic exercise;Patient/family education;Manual techniques;Dry needling;Joint Manipulations    PT Next Visit Plan Progress L shoulder AROM and strength    PT Home Exercise Plan See patient instructions    Consulted and Agree with Plan of Care Patient           Patient will benefit from skilled therapeutic intervention in order to improve the following deficits and impairments:  Decreased activity tolerance, Decreased range of motion, Decreased strength, Impaired UE  functional use, Postural dysfunction, Pain  Visit Diagnosis: Abnormal posture  Chronic left shoulder pain  Stiffness of left shoulder, not elsewhere classified     Problem List Patient Active Problem List   Diagnosis Date Noted  . Syncope 03/26/2020  . Exertional dyspnea 03/26/2020  . Chest pain at rest 03/26/2020  . Hypercholesterolemia 03/26/2020  . Stage 3a chronic kidney disease 03/26/2020  . Neurogenic bladder 03/26/2020  . Congenital ureteral obstruction 03/26/2020  . Scoliosis of thoracolumbar spine 03/26/2020  . Status post total replacement of left hip 06/14/2018  . Unilateral primary osteoarthritis, left hip 04/26/2018  . Epicondylitis elbow, medial 12/17/2011  . Lateral epicondylitis 01/09/2011  . BURSITIS, HIP 07/22/2010  . LEG PAIN, RIGHT 05/22/2010  . UNEQUAL LEG LENGTH 05/22/2010  . SCOLIOSIS, LUMBAR SPINE 05/22/2010  . ABNORMALITY OF GAIT 05/22/2010    Cherlyn Cushing PT, MPT 04/11/2020, 5:22 PM  [facility] Physical Therapy [address] [place], [place], [address] Phone: [phone]   Fax:  [phone]  Name: Nicole Morris MRN: [id number] Date of Birth: 01-03-1961

## 2020-04-16 ENCOUNTER — Ambulatory Visit: Payer: No Typology Code available for payment source

## 2020-04-18 ENCOUNTER — Encounter: Payer: Self-pay | Admitting: Rehabilitative and Restorative Service Providers"

## 2020-04-18 ENCOUNTER — Other Ambulatory Visit: Payer: Self-pay

## 2020-04-18 ENCOUNTER — Ambulatory Visit: Payer: No Typology Code available for payment source | Admitting: Rehabilitative and Restorative Service Providers"

## 2020-04-18 DIAGNOSIS — M25512 Pain in left shoulder: Secondary | ICD-10-CM

## 2020-04-18 DIAGNOSIS — R293 Abnormal posture: Secondary | ICD-10-CM

## 2020-04-18 DIAGNOSIS — M25612 Stiffness of left shoulder, not elsewhere classified: Secondary | ICD-10-CM | POA: Diagnosis not present

## 2020-04-18 DIAGNOSIS — G8929 Other chronic pain: Secondary | ICD-10-CM

## 2020-04-18 NOTE — Therapy (Signed)
[facility] Physical Therapy [address] [place], [place], [address] Phone: [phone]   Fax:  [phone]  Physical Therapy Treatment  Patient Details  Name: Nicole Morris MRN: [id number] Date of Birth: Jul 03, 1961 Referring Provider (PT): Magnus Ivan   Encounter Date: 04/18/2020   PT End of Session - 04/18/20 1728    Visit Number 2    Number of Visits 16    PT Start Time 1301    PT Stop Time 1345    PT Time Calculation (min) 44 min    Activity Tolerance Patient tolerated treatment well;No increased pain    Behavior During Therapy WFL for tasks assessed/performed           Past Medical History:  Diagnosis Date  . Anxiety   . Asthma   . Chronic lower back pain   . Dysrhythmia    pt got Tachy last time under anesthesia  . High cholesterol   . History of blood transfusion    "related to surgeries" (06/14/2018)  . Hypothyroidism   . Kidney disease, chronic, stage II (GFR 60-89 ml/min)    "one of my kidneys is in my abdomen" (06/14/2018)  . Migraine    "daily-weekly" (06/14/2018)  . Neuromuscular disorder (HCC)    neuropathy in legs from back surgery  . PONV (postoperative nausea and vomiting)   . Scoliosis     Past Surgical History:  Procedure Laterality Date  . ABDOMINAL HYSTERECTOMY  1988  . BACK SURGERY    . BLADDER AUGMENTATION  1988  . HERNIA REPAIR    . JOINT REPLACEMENT    . KNEE ARTHROSCOPY Right   . SPINAL FUSION  2008   "w/harrington rods, etc"  . TOTAL HIP ARTHROPLASTY Left 06/14/2018  . TOTAL HIP ARTHROPLASTY Left 06/14/2018   Procedure: LEFT TOTAL HIP ARTHROPLASTY ANTERIOR APPROACH;  Surgeon: Kathryne Hitch, MD;  Location: [facility] OR;  Service: Orthopedics;  Laterality: Left;    There were no vitals filed for this visit.   Subjective Assessment - 04/18/20 1336    Subjective Nicole Morris reports moving better since starting PT.  Objective measures reflect this.    Pertinent History Previous lumbar spine surgery, L THA, R knee  scope    Limitations Other (comment)   Reaching and overhead function with the L shoulder   Patient Stated Goals Improve AROM, strength and use of L shoulder    Currently in Pain? Yes    Pain Score 5     Pain Location Shoulder    Pain Orientation Left    Pain Descriptors / Indicators Tightness    Pain Type Chronic pain    Pain Onset More than a month ago    Pain Frequency Constant    Aggravating Factors  End ROM    Pain Relieving Factors Ice    Effect of Pain on Daily Activities L UE ADLs (hair getting easier)              [facility] PT Assessment - 04/18/20 0001      AROM   Left Shoulder Flexion 135 Degrees    Left Shoulder Internal Rotation 70 Degrees    Left Shoulder External Rotation 70 Degrees    Left Shoulder Horizontal ADduction 35 Degrees                         [facility] Adult PT Treatment/Exercise - 04/18/20 0001      Exercises   Exercises Shoulder      Shoulder Exercises:  Supine   Protraction Strengthening;20 reps   3 seconds 2#   Protraction Weight (lbs) 2#    External Rotation AROM;Left;10 reps   10 seconds   Flexion AROM;10 reps   10 seconds palms in protract 1st     Shoulder Exercises: Seated   Retraction Strengthening;10 reps   5 seconds (shoulder blade pinches)     Shoulder Exercises: Standing   External Rotation AROM;10 reps;Left   10 seconds   Internal Rotation AROM;10 reps   10 seconds thumb up the back     Shoulder Exercises: Pulleys   Flexion Other (comment)   10X 10 seconds   Scaption Other (comment)   10X 10 seconds                 PT Education - 04/18/20 1727    Education Details HEP review and addition of ER AROM stretch.    Person(s) Educated Patient    Methods Explanation;Demonstration;Tactile cues;Verbal cues;Handout    Comprehension Verbal cues required;Need further instruction;Returned demonstration;Verbalized understanding;Tactile cues required               PT Long Term Goals - 04/18/20 1727      PT  LONG TERM GOAL #1   Title Nicole Morris will report L shoulder pain consistently 0-3/10 on the Numeric Pain rating Scale.    Baseline As high as 5-7/10    Time 8    Period Weeks    Status On-going      PT LONG TERM GOAL #2   Title Nicole Morris will improve L shoulder AROM for flexion to 150; ER to 80; IR to 50 and HA to 30 degrees.    Baseline 125; 70; 40; 25 respectively (135/70/70/35 on 04/18/20)    Time 8    Period Weeks    Status On-going      PT LONG TERM GOAL #3   Title Improve L shoulder strength to at least 4+/5 for IR/ER.    Baseline See objective    Time 8    Period Weeks    Status On-going      PT LONG TERM GOAL #4   Title Nicole Morris will be able to manage her hair without L shoulder restrictions at DC.    Baseline Unable to do so (relies on husband).    Time 8    Period Weeks    Status Achieved      PT LONG TERM GOAL #5   Title Nicole Morris will be independent with her DC HEP.    Time 8    Period Weeks    Status On-going                 Plan - 04/18/20 1729    Clinical Impression Statement Nicole Morris is making good early progress with her AROM post-fracture.  ER AROM exercise was added today as that was the only direction assessed today that did not show objective progress as compared to evaluation.  AROM remains the priority with strengthening as appropriate.    Examination-Activity Limitations Bathing;Dressing;Hygiene/Grooming;Bed Mobility;Sleep;Reach Overhead;Carry    Stability/Clinical Decision Making Stable/Uncomplicated    Rehab Potential Good    PT Frequency Other (comment)   1-2X/week   PT Duration 8 weeks    PT Treatment/Interventions ADLs/Self Care Home Management;Cryotherapy;Moist Heat;Therapeutic activities;Neuromuscular re-education;Balance training;Therapeutic exercise;Patient/family education;Manual techniques;Dry needling;Joint Manipulations    PT Next Visit Plan Progress L shoulder AROM and strength    PT Home Exercise Plan See patient instructions    Consulted and Agree with  Plan of Care Patient           Patient will benefit from skilled therapeutic intervention in order to improve the following deficits and impairments:  Decreased activity tolerance, Decreased range of motion, Decreased strength, Impaired UE functional use, Postural dysfunction, Pain  Visit Diagnosis: Abnormal posture  Chronic left shoulder pain  Stiffness of left shoulder, not elsewhere classified     Problem List Patient Active Problem List   Diagnosis Date Noted  . Syncope 03/26/2020  . Exertional dyspnea 03/26/2020  . Chest pain at rest 03/26/2020  . Hypercholesterolemia 03/26/2020  . Stage 3a chronic kidney disease 03/26/2020  . Neurogenic bladder 03/26/2020  . Congenital ureteral obstruction 03/26/2020  . Scoliosis of thoracolumbar spine 03/26/2020  . Status post total replacement of left hip 06/14/2018  . Unilateral primary osteoarthritis, left hip 04/26/2018  . Epicondylitis elbow, medial 12/17/2011  . Lateral epicondylitis 01/09/2011  . BURSITIS, HIP 07/22/2010  . LEG PAIN, RIGHT 05/22/2010  . UNEQUAL LEG LENGTH 05/22/2010  . SCOLIOSIS, LUMBAR SPINE 05/22/2010  . ABNORMALITY OF GAIT 05/22/2010    Cherlyn Cushing PT, MPT 04/18/2020, 5:31 PM  [facility] Physical Therapy [address] [place], [place], [address] Phone: [phone]   Fax:  [phone]  Name: Nicole Morris MRN: [id number] Date of Birth: 05/03/1961

## 2020-04-18 NOTE — Patient Instructions (Signed)
Access Code: [email or web address] URL: [email or web address] Date: 04/18/2020 Prepared by: Pauletta Browns  Exercises Supine Shoulder External Rotation Stretch - 2-3 x daily - 7 x weekly - 1 sets - 10-20 reps - 10 seconds hold

## 2020-04-22 ENCOUNTER — Telehealth: Payer: Self-pay | Admitting: Rehabilitative and Restorative Service Providers"

## 2020-04-22 ENCOUNTER — Encounter: Payer: No Typology Code available for payment source | Admitting: Rehabilitative and Restorative Service Providers"

## 2020-04-22 NOTE — Telephone Encounter (Signed)
Called Cat.  She forgot her appointment.  Reminded her of her Wednesday 2:30 appointment.

## 2020-04-24 ENCOUNTER — Ambulatory Visit: Payer: No Typology Code available for payment source | Admitting: Rehabilitative and Restorative Service Providers"

## 2020-04-24 ENCOUNTER — Other Ambulatory Visit: Payer: Self-pay

## 2020-04-24 ENCOUNTER — Encounter: Payer: Self-pay | Admitting: Rehabilitative and Restorative Service Providers"

## 2020-04-24 DIAGNOSIS — M25512 Pain in left shoulder: Secondary | ICD-10-CM | POA: Diagnosis not present

## 2020-04-24 DIAGNOSIS — R293 Abnormal posture: Secondary | ICD-10-CM

## 2020-04-24 DIAGNOSIS — M25612 Stiffness of left shoulder, not elsewhere classified: Secondary | ICD-10-CM | POA: Diagnosis not present

## 2020-04-24 DIAGNOSIS — G8929 Other chronic pain: Secondary | ICD-10-CM

## 2020-04-24 NOTE — Therapy (Signed)
[facility] Physical Therapy [address] [place], [place], [address] Phone: [phone]   Fax:  [phone]  Physical Therapy Treatment  Patient Details  Name: Nicole Morris MRN: [id number] Date of Birth: Feb 16, 1961 Referring Provider (PT): Magnus Ivan   Encounter Date: 04/24/2020   PT End of Session - 04/24/20 1516    Visit Number 3    Number of Visits 16    PT Start Time 1427    PT Stop Time 1512    PT Time Calculation (min) 45 min    Activity Tolerance Patient tolerated treatment well;No increased pain    Behavior During Therapy WFL for tasks assessed/performed           Past Medical History:  Diagnosis Date  . Anxiety   . Asthma   . Chronic lower back pain   . Dysrhythmia    pt got Tachy last time under anesthesia  . High cholesterol   . History of blood transfusion    "related to surgeries" (06/14/2018)  . Hypothyroidism   . Kidney disease, chronic, stage II (GFR 60-89 ml/min)    "one of my kidneys is in my abdomen" (06/14/2018)  . Migraine    "daily-weekly" (06/14/2018)  . Neuromuscular disorder (HCC)    neuropathy in legs from back surgery  . PONV (postoperative nausea and vomiting)   . Scoliosis     Past Surgical History:  Procedure Laterality Date  . ABDOMINAL HYSTERECTOMY  1988  . BACK SURGERY    . BLADDER AUGMENTATION  1988  . HERNIA REPAIR    . JOINT REPLACEMENT    . KNEE ARTHROSCOPY Right   . SPINAL FUSION  2008   "w/harrington rods, etc"  . TOTAL HIP ARTHROPLASTY Left 06/14/2018  . TOTAL HIP ARTHROPLASTY Left 06/14/2018   Procedure: LEFT TOTAL HIP ARTHROPLASTY ANTERIOR APPROACH;  Surgeon: Kathryne Hitch, MD;  Location: [facility] OR;  Service: Orthopedics;  Laterality: Left;    There were no vitals filed for this visit.   Subjective Assessment - 04/24/20 1431    Subjective Sleep has been tough the last couple nights.  She was doing some yoga that brought her into a position of impingement.    Pertinent History Previous  lumbar spine surgery, L THA, R knee scope    Limitations Other (comment)   Reaching and overhead function with the L shoulder   Patient Stated Goals Improve AROM, strength and use of L shoulder    Currently in Pain? Yes    Pain Score 5     Pain Location Shoulder    Pain Orientation Left    Pain Descriptors / Indicators Tightness;Aching;Sharp    Pain Type Chronic pain    Pain Onset More than a month ago    Pain Frequency Constant    Aggravating Factors  End range, ROM    Pain Relieving Factors Ice    Effect of Pain on Daily Activities L UE ADLs              [facility] PT Assessment - 04/24/20 0001      AROM   Left Shoulder Flexion 140 Degrees    Left Shoulder Internal Rotation 65 Degrees    Left Shoulder External Rotation 75 Degrees    Left Shoulder Horizontal ADduction 35 Degrees                         [facility] Adult PT Treatment/Exercise - 04/24/20 0001      Exercises   Exercises  Shoulder      Shoulder Exercises: Supine   Protraction Strengthening;20 reps   3 seconds 4#   Protraction Weight (lbs) 4#    External Rotation AROM;Left;10 reps   10 seconds   Flexion AROM;10 reps   10 seconds palms in protract 1st     Shoulder Exercises: Seated   Retraction Strengthening;10 reps   5 seconds (shoulder blade pinches)     Shoulder Exercises: Standing   Internal Rotation AROM;10 reps   10 seconds thumb up the back   Other Standing Exercises NEXT VISIT posterior capsule stretch (horizontal adduction) 10X 10 seconds      Shoulder Exercises: Pulleys   Flexion Other (comment)   10X 10 seconds   Scaption Other (comment)   10X 10 seconds                 PT Education - 04/24/20 1515    Education Details Reviewed and corrected HEP.  Emphasized AROM with HEP.    Person(s) Educated Patient    Methods Explanation;Demonstration;Tactile cues;Verbal cues    Comprehension Verbalized understanding;Tactile cues required;Need further instruction;Returned  demonstration;Verbal cues required               PT Long Term Goals - 04/24/20 1516      PT LONG TERM GOAL #1   Title Cat will report L shoulder pain consistently 0-3/10 on the Numeric Pain rating Scale.    Baseline As high as 5-7/10    Time 8    Period Weeks    Status On-going      PT LONG TERM GOAL #2   Title Cat will improve L shoulder AROM for flexion to 150; ER to 80; IR to 50 and HA to 30 degrees.    Baseline 125; 70; 40; 25 respectively (140/75/65/35 on 04/24/20)    Time 8    Period Weeks    Status On-going      PT LONG TERM GOAL #3   Title Improve L shoulder strength to at least 4+/5 for IR/ER.    Baseline See objective    Time 8    Period Weeks    Status On-going      PT LONG TERM GOAL #4   Title Cat will be able to manage her hair without L shoulder restrictions at DC.    Baseline Unable to do so (relies on husband).    Time 8    Period Weeks    Status Achieved      PT LONG TERM GOAL #5   Title Cat will be independent with her DC HEP.    Time 8    Period Weeks    Status On-going                 Plan - 04/24/20 1517    Clinical Impression Statement Cat reported doing a yoga exercise in an impingement type posture that aggravated her shoulder.  I encouraged her to stick to her prescribed exercises to improve capsular flexibility, AROM and L shoulder function.  AROM was objectively improved for flexion and ER.  IR was a bit tight after her yoga injury.  Sticking to the prescibed POC will facilitate meeting long-term goals.    Examination-Activity Limitations Bathing;Dressing;Hygiene/Grooming;Bed Mobility;Sleep;Reach Overhead;Carry    Stability/Clinical Decision Making Stable/Uncomplicated    Rehab Potential Good    PT Frequency Other (comment)   1-2X/week   PT Duration 8 weeks    PT Treatment/Interventions ADLs/Self Care Home Management;Cryotherapy;Moist Heat;Therapeutic activities;Neuromuscular re-education;Balance training;Therapeutic  exercise;Patient/family education;Manual techniques;Dry needling;Joint Manipulations    PT Next Visit Plan Progress L shoulder AROM and strength    PT Home Exercise Plan See patient instructions    Consulted and Agree with Plan of Care Patient           Patient will benefit from skilled therapeutic intervention in order to improve the following deficits and impairments:  Decreased activity tolerance, Decreased range of motion, Decreased strength, Impaired UE functional use, Postural dysfunction, Pain  Visit Diagnosis: Abnormal posture  Chronic left shoulder pain  Stiffness of left shoulder, not elsewhere classified     Problem List Patient Active Problem List   Diagnosis Date Noted  . Syncope 03/26/2020  . Exertional dyspnea 03/26/2020  . Chest pain at rest 03/26/2020  . Hypercholesterolemia 03/26/2020  . Stage 3a chronic kidney disease 03/26/2020  . Neurogenic bladder 03/26/2020  . Congenital ureteral obstruction 03/26/2020  . Scoliosis of thoracolumbar spine 03/26/2020  . Status post total replacement of left hip 06/14/2018  . Unilateral primary osteoarthritis, left hip 04/26/2018  . Epicondylitis elbow, medial 12/17/2011  . Lateral epicondylitis 01/09/2011  . BURSITIS, HIP 07/22/2010  . LEG PAIN, RIGHT 05/22/2010  . UNEQUAL LEG LENGTH 05/22/2010  . SCOLIOSIS, LUMBAR SPINE 05/22/2010  . ABNORMALITY OF GAIT 05/22/2010    Cherlyn Cushing PT, MPT 04/24/2020, 3:20 PM  [facility] Physical Therapy [address] [place], [place], [address] Phone: [phone]   Fax:  [phone]  Name: Kayleah Appleyard MRN: [id number] Date of Birth: 14-Dec-1960

## 2020-04-29 ENCOUNTER — Ambulatory Visit: Payer: No Typology Code available for payment source | Admitting: Cardiovascular Disease

## 2020-04-29 ENCOUNTER — Other Ambulatory Visit: Payer: Self-pay

## 2020-04-29 DIAGNOSIS — R55 Syncope and collapse: Secondary | ICD-10-CM | POA: Diagnosis not present

## 2020-04-29 DIAGNOSIS — Z95818 Presence of other cardiac implants and grafts: Secondary | ICD-10-CM

## 2020-04-29 MED ORDER — LIDOCAINE-EPINEPHRINE 1 %-1:100000 IJ SOLN: 10.0000 mL | Freq: Once | INTRAMUSCULAR | Status: DC

## 2020-04-29 NOTE — Patient Instructions (Signed)
Discharge Instructions for  Loop Recorder Implant    Follow up: Keep your wound check appointment on 05/07/2020 at 8:30 am.  If you have any questions or concerns, please call the office at [phone].  ACTIVITY No restrictions. DO wear your seatbelt, even if it crosses over the site.   WOUND CARE  Keep the wound area clean and dry.  Remove the dressing the day after (usually 24 hours after the procedure).  DO NOT SUBMERGE UNDER WATER UNTIL FULLY HEALED (no tub baths, hot tubs, swimming pools, etc.).   You  may shower or take a sponge bath after the dressing is removed. DO NOT SOAK the area and do not allow the shower to directly spray on the site.  If you have tape/steri-strips on your wound, these will fall off; do not pull them off prematurely.    No bandage is needed on the site.  DO  NOT apply any creams, oils, or ointments to the wound area.  If you notice any drainage or discharge from the wound, any swelling, excessive redness or bruising at the site, or if you develop a fever > 101? F, call the office at once at [phone].

## 2020-04-29 NOTE — Progress Notes (Signed)
LOOP RECORDER IMPLANT   Procedure report  Procedure performed:  Loop recorder implantation   Reason for procedure:  Recurrent syncope/near-syncope  Procedure performed by:  Thurmon Fair, MD  Complications:  None  Estimated blood loss:  <5 mL  Medications administered during procedure:  Lidocaine 1% with 1/10,000 epinephrine 10 mL locally Device details:  [employer] Reveal Linq model number [id number], serial number [id number] Procedure details:  After the risks and benefits of the procedure were discussed the patient provided informed consent. The patient was prepped and draped in usual sterile fashion. Local anesthesia was administered to an area 2 cm to the left of the sternum in the 4th intercostal space. A cutaneous incision was made using the incision tool. The introducer was then used to create a subcutaneous tunnel and carefully deploy the device. Local pressure was held to ensure hemostasis.  The incision was closed with SteriStrips and a sterile dressing was applied.  R waves 0.45 mV.  Thurmon Fair, MD, [facility] [phone] office [phone] pager 04/29/2020 1:32 PM

## 2020-04-30 ENCOUNTER — Encounter: Payer: No Typology Code available for payment source | Admitting: Physical Therapy

## 2020-05-02 ENCOUNTER — Encounter: Payer: No Typology Code available for payment source | Admitting: Physical Therapy

## 2020-05-06 ENCOUNTER — Other Ambulatory Visit: Payer: Self-pay

## 2020-05-06 ENCOUNTER — Encounter: Payer: Self-pay | Admitting: Orthopaedic Surgery

## 2020-05-06 ENCOUNTER — Ambulatory Visit: Payer: No Typology Code available for payment source | Admitting: Rehabilitative and Restorative Service Providers"

## 2020-05-06 ENCOUNTER — Ambulatory Visit (INDEPENDENT_AMBULATORY_CARE_PROVIDER_SITE_OTHER): Payer: No Typology Code available for payment source

## 2020-05-06 ENCOUNTER — Ambulatory Visit: Payer: No Typology Code available for payment source | Admitting: Orthopaedic Surgery

## 2020-05-06 ENCOUNTER — Encounter: Payer: Self-pay | Admitting: Rehabilitative and Restorative Service Providers"

## 2020-05-06 DIAGNOSIS — S42292D Other displaced fracture of upper end of left humerus, subsequent encounter for fracture with routine healing: Secondary | ICD-10-CM

## 2020-05-06 DIAGNOSIS — M25512 Pain in left shoulder: Secondary | ICD-10-CM

## 2020-05-06 DIAGNOSIS — M25612 Stiffness of left shoulder, not elsewhere classified: Secondary | ICD-10-CM | POA: Diagnosis not present

## 2020-05-06 DIAGNOSIS — G8929 Other chronic pain: Secondary | ICD-10-CM | POA: Diagnosis not present

## 2020-05-06 DIAGNOSIS — R293 Abnormal posture: Secondary | ICD-10-CM

## 2020-05-06 NOTE — Patient Instructions (Signed)
Access Code: [email or web address] URL: [email or web address] Date: 05/06/2020 Prepared by: Pauletta Browns  Exercises Standing Shoulder Internal Rotation with Anchored Resistance - 1 x daily - 7 x weekly - 2 sets - 10 reps Shoulder External Rotation with Anchored Resistance with Towel Under Elbow - 1 x daily - 7 x weekly - 2 sets - 10 reps - 3 hold

## 2020-05-06 NOTE — Therapy (Addendum)
[facility] Physical Therapy [address] [place], [place], [address] Phone: [phone]   Fax:  [phone]  Physical Therapy Treatment  Patient Details  Name: Nicole Morris MRN: [id number] Date of Birth: [birth year] Referring Provider (PT): Ninfa Linden   Encounter Date: 05/06/2020   PT End of Session - 05/06/20 7290    Visit Number 4    Number of Visits 16    PT Start Time 0930    PT Stop Time 2111    PT Time Calculation (min) 44 min    Activity Tolerance Patient tolerated treatment well;No increased pain    Behavior During Therapy WFL for tasks assessed/performed           Past Medical History:  Diagnosis Date  . Anxiety   . Asthma   . Chronic lower back pain   . Dysrhythmia    pt got Tachy last time under anesthesia  . High cholesterol   . History of blood transfusion    "related to surgeries" (06/14/2018)  . Hypothyroidism   . Kidney disease, chronic, stage II (GFR 60-89 ml/min)    "one of my kidneys is in my abdomen" (06/14/2018)  . Migraine    "daily-weekly" (06/14/2018)  . Neuromuscular disorder ([place])    neuropathy in legs from back surgery  . PONV (postoperative nausea and vomiting)   . Scoliosis     Past Surgical History:  Procedure Laterality Date  . ABDOMINAL HYSTERECTOMY  1988  . BACK SURGERY    . BLADDER AUGMENTATION  1988  . HERNIA REPAIR    . JOINT REPLACEMENT    . KNEE ARTHROSCOPY Right   . SPINAL FUSION  2008   "w/harrington rods, etc"  . TOTAL HIP ARTHROPLASTY Left 06/14/2018  . TOTAL HIP ARTHROPLASTY Left 06/14/2018   Procedure: LEFT TOTAL HIP ARTHROPLASTY ANTERIOR APPROACH;  Surgeon: Mcarthur Rossetti, MD;  Location: [facility];  Service: Orthopedics;  Laterality: Left;    There were no vitals filed for this visit.   Subjective Assessment - 05/06/20 1732    Subjective Cat reports poor compliance due to another medical condition and her ailing mother.    Pertinent History Previous lumbar spine surgery, L THA, R knee  scope    Limitations Other (comment)   Reaching and overhead function with the L shoulder   Patient Stated Goals Improve AROM, strength and use of L shoulder    Currently in Pain? No/denies    Pain Onset More than a month ago              [facility] PT Assessment - 05/06/20 0001      AROM   Left Shoulder Flexion 150 Degrees    Left Shoulder Internal Rotation 70 Degrees    Left Shoulder External Rotation 80 Degrees    Left Shoulder Horizontal ADduction 40 Degrees                         [facility] Adult PT Treatment/Exercise - 05/06/20 0001      Exercises   Exercises Shoulder      Shoulder Exercises: Supine   Protraction Strengthening;20 reps   3 seconds 5#   Protraction Weight (lbs) 5#    External Rotation AROM;Left;10 reps   10 seconds   Flexion AROM;10 reps   10 seconds palms in protract 1st     Shoulder Exercises: Seated   Retraction Strengthening;10 reps   5 seconds (shoulder blade pinches)     Shoulder Exercises: Standing  External Rotation Strengthening;Left;10 reps   3 seconds slow eccentrics   Theraband Level (Shoulder External Rotation) Level 2 (Red)    Internal Rotation AROM;Strengthening;Left;10 reps   10 seconds thumb up the back & 10X slow eccentrics tband   Theraband Level (Shoulder Internal Rotation) Level 2 (Red)    Flexion AAROM;Left;10 reps   10 seconds; protract first                 PT Education - 05/06/20 1732    Education Details Reviewed and advanced HEP.    Person(s) Educated Patient    Methods Explanation;Demonstration;Verbal cues;Handout    Comprehension Verbal cues required;Need further instruction;Returned demonstration;Verbalized understanding               PT Long Term Goals - 05/06/20 1733      PT LONG TERM GOAL #1   Title Cat will report L shoulder pain consistently 0-3/10 on the Numeric Pain rating Scale.    Baseline As high as 5-7/10    Time 8    Period Weeks    Status On-going      PT LONG TERM GOAL #2     Title Cat will improve L shoulder AROM for flexion to 150; ER to 80; IR to 50 and HA to 30 degrees.    Baseline 125; 70; 40; 25 respectively (150/80/70/40 on 05/06/20)    Time 8    Period Weeks    Status Achieved      PT LONG TERM GOAL #3   Title Improve L shoulder strength to at least 4+/5 for IR/ER.    Baseline See objective    Time 8    Period Weeks    Status On-going      PT LONG TERM GOAL #4   Title Cat will be able to manage her hair without L shoulder restrictions at DC.    Baseline Unable to do so (relies on husband).    Time 8    Period Weeks    Status Achieved      PT LONG TERM GOAL #5   Title Cat will be independent with her DC HEP.    Time 8    Period Weeks    Status Achieved                 Plan - 05/06/20 1734    Clinical Impression Statement Even with decreased HEP compliance, Cat is making progress towards long-term goals (met 3 of 5).  With her terminally ill mother and an unrelated cardic issue, she would like to continue independent PT.  She has an open door to continue if requested.    Examination-Activity Limitations Bathing;Dressing;Hygiene/Grooming;Bed Mobility;Sleep;Reach Overhead;Carry    Stability/Clinical Decision Making Stable/Uncomplicated    Rehab Potential Good    PT Frequency Other (comment)   1-2X/week   PT Duration 8 weeks    PT Treatment/Interventions ADLs/Self Care Home Management;Cryotherapy;Moist Heat;Therapeutic activities;Neuromuscular re-education;Balance training;Therapeutic exercise;Patient/family education;Manual techniques;Dry needling;Joint Manipulations    PT Next Visit Plan Progress L shoulder AROM and strength    PT Home Exercise Plan See patient instructions    Consulted and Agree with Plan of Care Patient           Patient will benefit from skilled therapeutic intervention in order to improve the following deficits and impairments:  Decreased activity tolerance, Decreased range of motion, Decreased strength,  Impaired UE functional use, Postural dysfunction, Pain  Visit Diagnosis: Abnormal posture  Chronic left shoulder pain  Stiffness of left shoulder,  not elsewhere classified     Problem List Patient Active Problem List   Diagnosis Date Noted  . Syncope 03/26/2020  . Exertional dyspnea 03/26/2020  . Chest pain at rest 03/26/2020  . Hypercholesterolemia 03/26/2020  . Stage 3a chronic kidney disease 03/26/2020  . Neurogenic bladder 03/26/2020  . Congenital ureteral obstruction 03/26/2020  . Scoliosis of thoracolumbar spine 03/26/2020  . Status post total replacement of left hip 06/14/2018  . Unilateral primary osteoarthritis, left hip 04/26/2018  . Epicondylitis elbow, medial 12/17/2011  . Lateral epicondylitis 01/09/2011  . BURSITIS, HIP 07/22/2010  . LEG PAIN, RIGHT 05/22/2010  . UNEQUAL LEG LENGTH 05/22/2010  . SCOLIOSIS, LUMBAR SPINE 05/22/2010  . ABNORMALITY OF GAIT 05/22/2010    Farley Ly PT, MPT 05/06/2020, 5:36 PM  [facility] Physical Therapy [address] [place], [place], [address] Phone: [phone]   Fax:  [phone]  Name: Edilia Ghuman MRN: [id number] Date of Birth: 03-15-1961   PHYSICAL THERAPY DISCHARGE SUMMARY  Visits from Start of Care: 4  Current functional level related to goals / functional outcomes: See note   Remaining deficits: See note   Education / Equipment: HEP Plan:                                                    Patient goals were partially met. Patient is being discharged due to not returning since the last visit.  ?????

## 2020-05-06 NOTE — Progress Notes (Signed)
The patient is now over 3 months status post a mechanical fall in which she sustained a nondisplaced proximal humerus fracture of the left shoulder.  She reports that she is doing much better overall and has minimal discomfort that shoulder.  She is normal working through therapy as well.  She is very active 60 years old.  She does report a week ago she did have a pacemaker placed and she does not feel that now and has no pain.  She said the procedure went very well.  He.  She is walking without assistive device.  We did replace her left hip in October 2019.  Examination of her left shoulder shows that she can reach overhead and reach behind her and across the front.  There is just some weakness in the shoulder but not terrible.  She is not interested in any other type of intervention.  She reports good progress with therapy.  3 views left shoulder shows that is well located and the fracture is completely healed.  This did involve the surgical neck and the greater tuberosity but was completely nondisplaced.  The shoulder is well located.  This point since she is doing so well follow-up can be as needed.  All questions and concerns were answered and addressed.  If there are any issues at all she will let us know.

## 2020-05-07 ENCOUNTER — Other Ambulatory Visit: Payer: Self-pay

## 2020-05-07 ENCOUNTER — Ambulatory Visit (INDEPENDENT_AMBULATORY_CARE_PROVIDER_SITE_OTHER): Payer: No Typology Code available for payment source | Admitting: Emergency Medicine

## 2020-05-07 DIAGNOSIS — R55 Syncope and collapse: Secondary | ICD-10-CM

## 2020-05-07 LAB — CUP PACEART INCLINIC DEVICE CHECK
Date Time Interrogation Session: 20210831085025
Implantable Pulse Generator Implant Date: 20210823

## 2020-05-07 NOTE — Addendum Note (Signed)
Addended by: Unk Lightning T on: 05/07/2020 08:58 AM   Modules accepted: Level of Service

## 2020-05-07 NOTE — Progress Notes (Signed)
ILR wound check in clinic. Steri strips removed prior to OV. Wound well healed. Home monitor transmitting nightly. No episodes. Questions answered. 

## 2020-05-08 ENCOUNTER — Encounter: Payer: No Typology Code available for payment source | Admitting: Physical Therapy

## 2020-05-14 ENCOUNTER — Encounter: Payer: No Typology Code available for payment source | Admitting: Physical Therapy

## 2020-05-16 ENCOUNTER — Encounter: Payer: No Typology Code available for payment source | Admitting: Physical Therapy

## 2020-06-05 ENCOUNTER — Ambulatory Visit: Payer: No Typology Code available for payment source | Admitting: Cardiovascular Disease

## 2020-06-06 ENCOUNTER — Ambulatory Visit (INDEPENDENT_AMBULATORY_CARE_PROVIDER_SITE_OTHER): Payer: No Typology Code available for payment source

## 2020-06-06 DIAGNOSIS — R55 Syncope and collapse: Secondary | ICD-10-CM

## 2020-06-06 LAB — CUP PACEART REMOTE DEVICE CHECK
Date Time Interrogation Session: 20210928081243
Implantable Pulse Generator Implant Date: 20210823

## 2020-06-10 NOTE — Progress Notes (Signed)
Carelink Summary Report / Loop Recorder 

## 2020-07-03 ENCOUNTER — Other Ambulatory Visit: Payer: Self-pay

## 2020-07-03 ENCOUNTER — Ambulatory Visit (INDEPENDENT_AMBULATORY_CARE_PROVIDER_SITE_OTHER): Payer: No Typology Code available for payment source | Admitting: Physician Assistant

## 2020-07-03 ENCOUNTER — Ambulatory Visit (INDEPENDENT_AMBULATORY_CARE_PROVIDER_SITE_OTHER): Payer: No Typology Code available for payment source

## 2020-07-03 ENCOUNTER — Encounter: Payer: Self-pay | Admitting: Physician Assistant

## 2020-07-03 DIAGNOSIS — M25512 Pain in left shoulder: Secondary | ICD-10-CM

## 2020-07-03 DIAGNOSIS — M25062 Hemarthrosis, left knee: Secondary | ICD-10-CM

## 2020-07-03 DIAGNOSIS — M25562 Pain in left knee: Secondary | ICD-10-CM

## 2020-07-03 MED ORDER — LIDOCAINE HCL 1 % IJ SOLN
5.0000 mL | INTRAMUSCULAR | Status: AC | PRN
  Administered 2020-07-03: 5 mL

## 2020-07-03 MED ORDER — METHYLPREDNISOLONE ACETATE 40 MG/ML IJ SUSP
40.0000 mg | INTRAMUSCULAR | Status: AC | PRN
Start: 2020-07-03 — End: 2020-07-03
  Administered 2020-07-03: 40 mg via INTRA_ARTICULAR

## 2020-07-03 NOTE — Progress Notes (Signed)
Office Visit Note   Patient: Nicole Morris           Date of Birth: [birth year]           MRN: [id number] Visit Date: 07/03/2020              Requested by: Laurann Montana, MD [phone] [address] [place],  [place] [address] PCP: Laurann Montana, MD   Assessment & Plan: Visit Diagnoses:  1. Left shoulder pain, unspecified chronicity   2. Left knee pain, unspecified chronicity   3. Knee hemarthrosis, left     Plan: Given the large hemarthrosis of the knee recommend MRI of the knee to rule out internal derangement.  Have her follow-up after the MRI to get the results discuss further treatment.  Reassurance was given that there is no fracture of the left shoulder and she has excellent range of motion of the shoulder.  Questions were encouraged and answered.  Follow-Up Instructions: Return After MRI.   Orders:  Orders Placed This Encounter  Procedures  . Large Joint Inj  . XR Knee 1-2 Views Left  . XR Shoulder Left   No orders of the defined types were placed in this encounter.     Procedures: Large Joint Inj: L knee on 07/03/2020 5:34 PM Indications: pain Details: 22 G 1.5 in needle, superolateral approach  Arthrogram: No  Medications: 40 mg methylPREDNISolone acetate 40 MG/ML; 5 mL lidocaine 1 % Aspirate: 41 mL bloody Outcome: tolerated well, no immediate complications Procedure, treatment alternatives, risks and benefits explained, specific risks discussed. Consent was given by the patient. Immediately prior to procedure a time out was called to verify the correct patient, procedure, equipment, support staff and site/side marked as required. Patient was prepped and draped in the usual sterile fashion.       Clinical Data: No additional findings.   Subjective: Chief Complaint  Patient presents with  . Left Knee - Pain  . Left Shoulder - Pain    HPI Patient is a 59 year old female were seen today for left knee pain and left shoulder pain.  She had a  fall this past Monday night.  She did not lose consciousness.  Patient said the she is wearing a loop recorder trying to figure out why she is following. She notes that she fell on her left side.  She is having difficulty ambulating due to the left knee pain and notes decreased range of motion of the knee.  States her shoulder sore that she feels like it is more bruised than anything.  She denies any numbness tingling down the left arm. Review of Systems Please see HPI  Objective: Vital Signs: There were no vitals taken for this visit.  Physical Exam Constitutional:      Appearance: She is not ill-appearing or diaphoretic.  Pulmonary:     Effort: Pulmonary effort is normal.  Neurological:     Mental Status: She is alert and oriented to person, place, and time.  Psychiatric:        Mood and Affect: Mood normal.     Ortho Exam Left shoulder fluid range of motion without pain.  5-5 strength external/internal rotation against resistance empty can test is negative bilaterally. Left knee she is able to do a straight leg raise but it is difficult.  Also when isolating her knee she is able to extend her knee through the knee joint.  She has significant ecchymosis of the anterior aspect of the knee particularly over the  patella tibial tendon region.  Positive effusion left knee.  No effusion right knee.  No abnormal warmth erythema of either knee.  Tenderness over the lateral joint line left knee no instability valgus varus stressing of the left knee.  Anterior posterior drawer is negative but there is definite guarding.  He will bring the left knee out to full extension flexes to approximately 100 510 degrees. Specialty Comments:  No specialty comments available.  Imaging: XR Knee 1-2 Views Left  Result Date: 07/03/2020 Left knee 2 views: Knee is well located.  No acute fractures.  All 3 compartments are well preserved.  Calcifications within the posterior aspect of the knee could represent  bony fragments versus loose body.  XR Shoulder Left  Result Date: 07/03/2020 Left shoulder 3 views: Shoulders well located.  No acute fractures no acute findings.  Glenohumeral humeral joint appears well-preserved.    PMFS History: Patient Active Problem List   Diagnosis Date Noted  . Syncope 03/26/2020  . Exertional dyspnea 03/26/2020  . Chest pain at rest 03/26/2020  . Hypercholesterolemia 03/26/2020  . Stage 3a chronic kidney disease ([facility]) 03/26/2020  . Neurogenic bladder 03/26/2020  . Congenital ureteral obstruction 03/26/2020  . Scoliosis of thoracolumbar spine 03/26/2020  . Status post total replacement of left hip 06/14/2018  . Unilateral primary osteoarthritis, left hip 04/26/2018  . Epicondylitis elbow, medial 12/17/2011  . Lateral epicondylitis 01/09/2011  . BURSITIS, HIP 07/22/2010  . LEG PAIN, RIGHT 05/22/2010  . UNEQUAL LEG LENGTH 05/22/2010  . SCOLIOSIS, LUMBAR SPINE 05/22/2010  . ABNORMALITY OF GAIT 05/22/2010   Past Medical History:  Diagnosis Date  . Anxiety   . Asthma   . Chronic lower back pain   . Dysrhythmia    pt got Tachy last time under anesthesia  . High cholesterol   . History of blood transfusion    "related to surgeries" (06/14/2018)  . Hypothyroidism   . Kidney disease, chronic, stage II (GFR 60-89 ml/min)    "one of my kidneys is in my abdomen" (06/14/2018)  . Migraine    "daily-weekly" (06/14/2018)  . Neuromuscular disorder ([facility])    neuropathy in legs from back surgery  . PONV (postoperative nausea and vomiting)   . Scoliosis     History reviewed. No pertinent family history.  Past Surgical History:  Procedure Laterality Date  . ABDOMINAL HYSTERECTOMY  1988  . BACK SURGERY    . BLADDER AUGMENTATION  1988  . HERNIA REPAIR    . JOINT REPLACEMENT    . KNEE ARTHROSCOPY Right   . SPINAL FUSION  2008   "w/harrington rods, etc"  . TOTAL HIP ARTHROPLASTY Left 06/14/2018  . TOTAL HIP ARTHROPLASTY Left 06/14/2018   Procedure: LEFT TOTAL  HIP ARTHROPLASTY ANTERIOR APPROACH;  Surgeon: Kathryne Hitch, MD;  Location: [facility];  Service: Orthopedics;  Laterality: Left;   Social History   Occupational History  . Not on file  Tobacco Use  . Smoking status: Current Every Day Smoker    Types: Cigarettes  . Smokeless tobacco: Never Used  Vaping Use  . Vaping Use: Never used  Substance and Sexual Activity  . Alcohol use: Yes    Comment: 06/14/2018 "maybe 1 bottle of wine/year; if that"  . Drug use: Never  . Sexual activity: Not Currently

## 2020-07-05 ENCOUNTER — Telehealth: Payer: Self-pay | Admitting: Cardiovascular Disease

## 2020-07-05 NOTE — Telephone Encounter (Signed)
Spoke with the patient. She needs to have a MRI of the knee and would like to know if this is safe. She currently has a loop recorder.  Spoke with out [employer] rep who stated that there were no restrictions for an MRI. The patient has been made aware.

## 2020-07-05 NOTE — Telephone Encounter (Signed)
Patient wanted to know if she can get an MRI of her knee with the device she has implanted. She does not have the test scheduled yet. Please advise

## 2020-07-08 ENCOUNTER — Telehealth: Payer: Self-pay

## 2020-07-08 NOTE — Telephone Encounter (Signed)
Order was just put in today.

## 2020-07-08 NOTE — Addendum Note (Signed)
Addended by: Barbette Or on: 07/08/2020 08:37 AM   Modules accepted: Orders

## 2020-07-08 NOTE — Telephone Encounter (Signed)
Patient called in saying she has not received call to get mri sch from facility.

## 2020-07-09 ENCOUNTER — Ambulatory Visit (INDEPENDENT_AMBULATORY_CARE_PROVIDER_SITE_OTHER): Payer: No Typology Code available for payment source

## 2020-07-09 ENCOUNTER — Other Ambulatory Visit: Payer: Self-pay

## 2020-07-09 DIAGNOSIS — R55 Syncope and collapse: Secondary | ICD-10-CM

## 2020-07-09 LAB — CUP PACEART REMOTE DEVICE CHECK
Date Time Interrogation Session: 20211031081103
Implantable Pulse Generator Implant Date: 20210823

## 2020-07-11 NOTE — Progress Notes (Signed)
Carelink Summary Report / Loop Recorder 

## 2020-07-21 ENCOUNTER — Ambulatory Visit
Admission: RE | Admit: 2020-07-21 | Discharge: 2020-07-21 | Disposition: A | Discharge status: Home / Self Care | Payer: No Typology Code available for payment source | Source: Ambulatory Visit | Attending: Physician Assistant | Admitting: Physician Assistant

## 2020-07-21 DIAGNOSIS — M25562 Pain in left knee: Secondary | ICD-10-CM

## 2020-07-21 DIAGNOSIS — M25062 Hemarthrosis, left knee: Secondary | ICD-10-CM

## 2020-07-24 ENCOUNTER — Ambulatory Visit: Payer: No Typology Code available for payment source | Admitting: Physician Assistant

## 2020-07-24 ENCOUNTER — Encounter: Payer: Self-pay | Admitting: Physician Assistant

## 2020-07-24 DIAGNOSIS — S82002D Unspecified fracture of left patella, subsequent encounter for closed fracture with routine healing: Secondary | ICD-10-CM | POA: Diagnosis not present

## 2020-07-24 NOTE — Progress Notes (Signed)
HPI [name] returns today for follow-up of her left knee.  She underwent a left knee MRI on 07/21/2020.  She states overall that her knee pain is improving.  She does wear the hinged knee brace at times around the home she does not have it on today's visit she is using no assistive devices to ambulate. MRI images of the left knee are reviewed with the patient.  Findings are 2 nondisplaced vertical fractures of the patella dividing it into thirds.  Radial tear involving the posterior horn medial meniscus.  Lateral meniscus small free edge tear.  Mild degenerative changes of the medial lateral compartments.  Moderate degenerative changes involving the patellofemoral joint.  Review of systems see HPI.  Physical exam: Left knee: No abnormal warmth erythema or effusion.  She has full extension still really with valgus varus stressing.  Full flexion 90 degrees post no pain.  Calf supple nontender.  Ambulates with a slight antalgic gait without any assistive device.  Impression: Left knee vertical patella fracture  Plan: At this point time we will not recommend any high impact activities.  Would like her to refrain from bending past 90 degrees.  Also have her begin wearing the knee brace when out of the home.  Elevation of the left leg encouraged as needed.  Follow-up in 3 weeks would like an AP view of the left knee at that time.  Questions were encouraged and answered at length today.

## 2020-07-29 ENCOUNTER — Ambulatory Visit: Payer: No Typology Code available for payment source | Admitting: Physician Assistant

## 2020-07-29 ENCOUNTER — Ambulatory Visit: Payer: Self-pay

## 2020-07-29 ENCOUNTER — Encounter: Payer: Self-pay | Admitting: Physician Assistant

## 2020-07-29 DIAGNOSIS — M5442 Lumbago with sciatica, left side: Secondary | ICD-10-CM

## 2020-07-29 DIAGNOSIS — M7062 Trochanteric bursitis, left hip: Secondary | ICD-10-CM | POA: Diagnosis not present

## 2020-07-29 MED ORDER — LIDOCAINE HCL 1 % IJ SOLN
3.0000 mL | INTRAMUSCULAR | Status: AC | PRN
  Administered 2020-07-29: 3 mL

## 2020-07-29 MED ORDER — METHYLPREDNISOLONE ACETATE 40 MG/ML IJ SUSP
40.0000 mg | INTRAMUSCULAR | Status: AC | PRN
  Administered 2020-07-29: 40 mg via INTRA_ARTICULAR

## 2020-07-29 NOTE — Progress Notes (Signed)
   Procedure Note  Patient: Nicole Morris             Date of Birth: 1961-04-21           MRN: [id number]             Visit Date: 07/29/2020 HPI: Patient returns today due to left hip pain left groin pain that started yesterday no known injury.  Work currently seeing for left 5.  She states overall that the left knee is slowly improving she states she forgets that she needs to be careful with her that time actually bending it beyond 90 degrees.  She is having some numbness tingling down the left leg with some burning pain.  She has history of extensive back surgery in the past.  Patient is nondiabetic.  Last vaccine was 1 to 2 weeks ago.  Review of systems see HPI otherwise negative   Physical exam: General well-developed well-nourished female no acute distress mood affect appropriate. Lower extremities: Good range of motion of both hips without pain.  Tenderness over the left trochanteric region.  Grossly 5 out of 5 strength throughout the lower extremities against resistance.  Negative straight leg raise bilaterally.  Procedures: Visit Diagnoses:  1. Trochanteric bursitis of left hip   2. Left-sided low back pain with left-sided sciatica, unspecified chronicity     Large Joint Inj: L greater trochanter on 07/29/2020 11:12 AM Indications: pain Details: 22 G 1.5 in needle, lateral approach  Arthrogram: No  Medications: 3 mL lidocaine 1 %; 40 mg methylPREDNISolone acetate 40 MG/ML Outcome: tolerated well, no immediate complications Procedure, treatment alternatives, risks and benefits explained, specific risks discussed. Consent was given by the patient. Immediately prior to procedure a time out was called to verify the correct patient, procedure, equipment, support staff and site/side marked as required. Patient was prepped and draped in the usual sterile fashion.     Radiographs: Lumbar spine 2 views: Shows no acute findings.  Retained hardware from multiple level fusions without  hardware failure. Left hip AP and lateral views: Status post left total hip arthroplasty with well-seated components.  No acute fractures or bony abnormalities.  Plan: We will see how she does with the trochanteric injection which she tolerated well today.  Pain persist or becomes worse she can always follow-up with Dr. Ferdie Ping for her radicular symptoms down the left leg.  Did advise her to go back on her Neurontin which she takes 300 mg nightly.  She will take this for the next 7 to 10 days.  She will follow-up with [place] as scheduled for her patella fracture.

## 2020-08-12 ENCOUNTER — Ambulatory Visit (INDEPENDENT_AMBULATORY_CARE_PROVIDER_SITE_OTHER): Payer: No Typology Code available for payment source

## 2020-08-12 ENCOUNTER — Telehealth: Payer: Self-pay

## 2020-08-12 DIAGNOSIS — R55 Syncope and collapse: Secondary | ICD-10-CM

## 2020-08-12 LAB — CUP PACEART REMOTE DEVICE CHECK
Date Time Interrogation Session: 20211203081023
Implantable Pulse Generator Implant Date: 20210823

## 2020-08-12 NOTE — Telephone Encounter (Signed)
Thanks. The gradual acceleration and gradual deceleration of the rate are typical for sinus tachycardia (reflex response to transient hypotension? Aborted vasovagal event)?Marland Kitchen Not a true arrhythmia. thanks

## 2020-08-12 NOTE — Telephone Encounter (Signed)
ILR scheduled remote received. 1 symptom event noted 07/14/20 @ 18:46. States she just walked up a flight of steps, reports lightheadedness after and grabbed the wall. Did not experience LOC. Reports frequent dizziness. Has stopped taking Gabapentin, Flexeril, tramadol. States her PCP is referring her to neuro for eval. Advised I will forward to Dr. Royann Shivers, we will call with changes.

## 2020-08-14 ENCOUNTER — Encounter: Payer: Self-pay | Admitting: Physician Assistant

## 2020-08-14 ENCOUNTER — Ambulatory Visit: Payer: No Typology Code available for payment source | Admitting: Physician Assistant

## 2020-08-14 ENCOUNTER — Ambulatory Visit (INDEPENDENT_AMBULATORY_CARE_PROVIDER_SITE_OTHER): Payer: No Typology Code available for payment source

## 2020-08-14 DIAGNOSIS — M25562 Pain in left knee: Secondary | ICD-10-CM | POA: Diagnosis not present

## 2020-08-14 NOTE — Progress Notes (Signed)
HPI: Nicole Morris returns today follow-up of her left hip pain and also her left knee vertical patella fractures.  She states the injection on 07/29/2020 for hip helped with her hip pain and this is gone.  Denies any numbness tingling down either leg.  States her knee is overall doing well.  Knee does feel weak and wants to discuss how to get any stronger.  Review of systems: See HPI otherwise negative  Radiographs: Left knee AP and sunrise view shows the knee to be well located.  Patella fracture on the sunrise view there is some sclerotic activity most medial aspect of the patella.  The lateral vertical patella fracture is not evident.  There is no displacement.  Patellofemoral joint is well-maintained.  Physical exam: Left knee excellent range of motion without pain.  No crepitus.  No effusion abnormal warmth erythema.  Ambulates without any assistive device.  Impression: Left knee vertical patella fractures 6 weeks status post injury  Left hip trochanteric bursitis resolved  Plan: She will work on Dance movement psychotherapist.  Discussed knee friendly exercises with her.  No high impact activities for least the next month.  Then resume activities as tolerated.  Questions encouraged and answered.  Follow-up as needed

## 2020-08-22 NOTE — Progress Notes (Signed)
Carelink Summary Report / Loop Recorder 

## 2020-09-12 LAB — CUP PACEART REMOTE DEVICE CHECK
Date Time Interrogation Session: 20220105080917
Implantable Pulse Generator Implant Date: 20210823

## 2020-09-16 ENCOUNTER — Ambulatory Visit (INDEPENDENT_AMBULATORY_CARE_PROVIDER_SITE_OTHER): Payer: No Typology Code available for payment source

## 2020-09-16 DIAGNOSIS — R55 Syncope and collapse: Secondary | ICD-10-CM

## 2020-09-30 ENCOUNTER — Ambulatory Visit: Payer: No Typology Code available for payment source | Admitting: Neurology

## 2020-09-30 ENCOUNTER — Other Ambulatory Visit: Payer: Self-pay | Admitting: *Deleted

## 2020-09-30 ENCOUNTER — Encounter: Payer: Self-pay | Admitting: Neurology

## 2020-09-30 ENCOUNTER — Telehealth: Payer: Self-pay | Admitting: Neurology

## 2020-09-30 VITALS — BP 127/84 | HR 70 | Ht 62.5 in | Wt 141.0 lb

## 2020-09-30 DIAGNOSIS — G43719 Chronic migraine without aura, intractable, without status migrainosus: Secondary | ICD-10-CM | POA: Diagnosis not present

## 2020-09-30 DIAGNOSIS — M4120 Other idiopathic scoliosis, site unspecified: Secondary | ICD-10-CM

## 2020-09-30 DIAGNOSIS — R4020 Unspecified coma: Secondary | ICD-10-CM | POA: Diagnosis not present

## 2020-09-30 MED ORDER — AIMOVIG 70 MG/ML ~~LOC~~ SOAJ: 70.0000 mg | SUBCUTANEOUS | 11 refills | Status: DC

## 2020-09-30 MED ORDER — ALPRAZOLAM 1 MG PO TABS
ORAL_TABLET | ORAL | 0 refills | Status: DC
Start: 2020-09-30 — End: 2021-04-01

## 2020-09-30 MED ORDER — SUMATRIPTAN SUCCINATE 50 MG PO TABS: 50.0000 mg | ORAL_TABLET | ORAL | 6 refills | Status: DC | PRN

## 2020-09-30 MED ORDER — ALPRAZOLAM 1 MG PO TABS: ORAL_TABLET | ORAL | 0 refills | Status: DC

## 2020-09-30 MED ORDER — AJOVY 225 MG/1.5ML ~~LOC~~ SOAJ: 1.5000 mL | SUBCUTANEOUS | 11 refills | Status: DC

## 2020-09-30 MED ORDER — ONDANSETRON 4 MG PO TBDP: 4.0000 mg | ORAL_TABLET | Freq: Three times a day (TID) | ORAL | 6 refills | Status: DC | PRN

## 2020-09-30 NOTE — Progress Notes (Signed)
Carelink Summary Report / Loop Recorder 

## 2020-09-30 NOTE — Telephone Encounter (Signed)
[employer] order sent to [facility]. They will obtain the auth and reach out to the patient to schedule.  

## 2020-09-30 NOTE — Progress Notes (Addendum)
Chief Complaint  Patient presents with  . New Patient (Initial Visit)    Reports having migraines since she was in college. She gets about one per week. She has frequent milder headaches. She sees Dr. Vear Clock for pain management. Also, she has had two episodes of loss of consciousness. Both events were unwitnessed. The first time she was walking a dog and woke up in the middle of the road. The second time she knew it was coming and it was preceded by dizziness.    HISTORICAL  Nicole Morris is a 60 year old female, seen in request by her primary care physician Dr. Laurann Montana, for evaluation of frequent headaches, initial evaluation was on 09-30-2020  I reviewed and summarized the referring note. PMHX. Hypothyroidism, on supplement Hyperlipidemia, on Crestor 5 mg every night Anxiety, depression, taking Zoloft 100 mg daily, Wellbutrin 300 mg daily,: History of spinal bifida, scoliosis, left hip replacement, lumbar decompression surgery, pain management by Dr. Thyra Breed, taking Nucynta help 50 mg daily,  She reported a history of migraine headaches since childhood, gradually getting worse, since 2019, she began to have daily headaches, usually starting from her neck, spreading forward to become occipital area sometimes bilateral frontal behind eye pressure headaches, she complains of 6 out of 10 daily pressure headache, for that reason, over the past 2 years, she has been taking Fioricet every day,  about once a week, she would get more severe headache with light noise sensitivity, nauseous, movement made her headache worse, sometimes preceded by visual auras, lasting for hours, sleep usually helps,  She never tried triptan treatment in the past, is not taking preventive medications, denies a history of stroke and coronary artery disease  She had a history of spina bifid, had extensive back surgery in the past, also had a history of bilateral hip replacement,  She also reported 1  episode of unexplained passing out episode on Jan 15, 2020, there was no warning, she passed out from a standing position,  She had extensive bladder surgery since childhood, relied on self cath since 1988, she denies bowel incontinence, no gait abnormalities.  Laboratory evaluation in May 2021, CPK 146, negative troponin, CMP, creatinine 1.12, CBC, with hemoglobin of 9.4 REVIEW OF SYSTEMS: Full 14 system review of systems performed and notable only for as above All other review of systems were negative.  ALLERGIES: Allergies  Allergen Reactions  . Atorvastatin     Other reaction(s): joint pain  . Codeine Nausea Only  . Mushroom Extract Complex Nausea Only    Nausea     HOME MEDICATIONS: Current Outpatient Medications  Medication Sig Dispense Refill  . albuterol (PROVENTIL HFA;VENTOLIN HFA) 108 (90 BASE) MCG/ACT inhaler Inhale 2 puffs into the lungs every 6 (six) hours as needed for wheezing or shortness of breath.    Marland Kitchen buPROPion (WELLBUTRIN XL) 300 MG 24 hr tablet Take 300 mg by mouth daily.     . butalbital-acetaminophen-caffeine (FIORICET, ESGIC) 50-325-40 MG tablet Take 1 tablet by mouth daily as needed for headache.    . diclofenac sodium (VOLTAREN) 1 % GEL Apply 2 g topically daily as needed (joint pain).     Marland Kitchen levothyroxine (SYNTHROID, LEVOTHROID) 50 MCG tablet Take 50 mcg by mouth daily before breakfast.    . LORazepam (ATIVAN) 0.5 MG tablet Take 0.25-0.5 mg by mouth every 8 (eight) hours as needed for anxiety.    . rosuvastatin (CRESTOR) 5 MG tablet Take 5 mg by mouth daily.     . sertraline (ZOLOFT)  100 MG tablet Take 100 mg by mouth every evening.     . tapentadol (NUCYNTA) 50 MG 12 hr tablet Take 50 mg by mouth daily as needed (pain).      Current Facility-Administered Medications  Medication Dose Route Frequency Provider Last Rate Last Admin  . lidocaine-EPINEPHrine (XYLOCAINE W/EPI) 1 %-1:100000 (with pres) injection 10 mL  10 mL Infiltration Once Croitoru, Mihai, MD         PAST MEDICAL HISTORY: Past Medical History:  Diagnosis Date  . Anxiety   . Asthma   . Chronic lower back pain   . Dysrhythmia    pt got Tachy last time under anesthesia  . High cholesterol   . History of blood transfusion    "related to surgeries" (06/14/2018)  . Hypothyroidism   . Kidney disease, chronic, stage II (GFR 60-89 ml/min)    "one of my kidneys is in my abdomen" (06/14/2018)  . Migraine    "daily-weekly" (06/14/2018)  . Neuromuscular disorder (HCC)    neuropathy in legs from back surgery  . PONV (postoperative nausea and vomiting)   . Scoliosis     PAST SURGICAL HISTORY: Past Surgical History:  Procedure Laterality Date  . ABDOMINAL HYSTERECTOMY  1988  . BACK SURGERY    . BLADDER AUGMENTATION  1988  . HERNIA REPAIR    . JOINT REPLACEMENT    . KNEE ARTHROSCOPY Right   . SPINAL FUSION  2008   "w/harrington rods, etc"  . TOTAL HIP ARTHROPLASTY Left 06/14/2018  . TOTAL HIP ARTHROPLASTY Left 06/14/2018   Procedure: LEFT TOTAL HIP ARTHROPLASTY ANTERIOR APPROACH;  Surgeon: Kathryne Hitch, MD;  Location: [facility] OR;  Service: Orthopedics;  Laterality: Left;    FAMILY HISTORY: Family History  Problem Relation Age of Onset  . Lung cancer Mother   . Liver cancer Father     SOCIAL HISTORY: Social History   Socioeconomic History  . Marital status: Married    Spouse name: Not on file  . Number of children: 2  . Years of education: college  . Highest education level: Not on file  Occupational History  . Occupation: Public librarian  Tobacco Use  . Smoking status: Never Smoker  . Smokeless tobacco: Never Used  Vaping Use  . Vaping Use: Never used  Substance and Sexual Activity  . Alcohol use: Yes    Comment: rare  . Drug use: Never  . Sexual activity: Not Currently  Other Topics Concern  . Not on file  Social History Narrative   Lives at home with husband.   Right-handed.   2-3 cups coffee per day.   Social Determinants of Health   Financial  Resource Strain: Not on file  Food Insecurity: Not on file  Transportation Needs: Not on file  Physical Activity: Not on file  Stress: Not on file  Social Connections: Not on file  Intimate Partner Violence: Not on file     PHYSICAL EXAM   Vitals:   09/30/20 0942  BP: 127/84  Pulse: 70  Weight: 141 lb (64 kg)  Height: 5' 2.5" (1.588 m)   Not recorded     Body mass index is 25.38 kg/m.  PHYSICAL EXAMNIATION:  Gen: NAD, conversant, well nourised, well groomed                     Cardiovascular: Regular rate rhythm, no peripheral edema, warm, nontender. Eyes: Conjunctivae clear without exudates or hemorrhage Neck: Supple, no carotid bruits. Pulmonary: Clear to auscultation  bilaterally   NEUROLOGICAL EXAM:  MENTAL STATUS: Speech:    Speech is normal; fluent and spontaneous with normal comprehension.  Cognition:     Orientation to time, place and person     Normal recent and remote memory     Normal Attention span and concentration     Normal Language, naming, repeating,spontaneous speech     Fund of knowledge   CRANIAL NERVES: CN II: Visual fields are full to confrontation. Pupils are round equal and briskly reactive to light. CN III, IV, VI: extraocular movement are normal. No ptosis. CN V: Facial sensation is intact to light touch CN VII: Face is symmetric with normal eye closure  CN VIII: Hearing is normal to causal conversation. CN IX, X: Phonation is normal. CN XI: Head turning and shoulder shrug are intact  MOTOR: There is no pronator drift of out-stretched arms. Muscle bulk and tone are normal. Muscle strength is normal.  REFLEXES: Reflexes are 2+ and symmetric at the biceps, triceps, knees, and ankles. Plantar responses are flexor.  SENSORY: Intact to light touch, pinprick and vibratory sensation are intact in fingers and toes.  COORDINATION: There is no trunk or limb dysmetria noted.  GAIT/STANCE: Posture is normal. Gait is steady with normal  steps, base, arm swing, and turning. Heel and toe walking are normal. Tandem gait is normal.  Romberg is absent.   DIAGNOSTIC DATA (LABS, IMAGING, TESTING) - I reviewed patient records, labs, notes, testing and imaging myself where available.   ASSESSMENT AND PLAN  Nicole Morris is a 60 y.o. 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 Ph.D.  [facility] [address], [address] [place], [place] [address] Ph: [phone] Fax: [phone]  CC:  Laurann Montana, MD [phone] [address] [place],  [place] [address]

## 2020-10-01 ENCOUNTER — Telehealth: Payer: Self-pay | Admitting: *Deleted

## 2020-10-01 ENCOUNTER — Ambulatory Visit: Payer: No Typology Code available for payment source | Admitting: Neurology

## 2020-10-01 NOTE — Telephone Encounter (Signed)
PA for Ajovy started on covermymeds (key: [id number]). Pt has coverage w/ [employer]. PA Case ID: [id number]. Decision pending.  Initially she was going to be started on Aimovig 70mg  but it was not preferred. Ajovy 225mg  is on her formulary. Dr. authorized the medication change.

## 2020-10-02 NOTE — Telephone Encounter (Signed)
PA approved through 12/31/2020. Pt [place]#[id number]. Call [phone] for any questions.  Pharmacy and patient have been notified.

## 2020-10-07 ENCOUNTER — Other Ambulatory Visit: Payer: No Typology Code available for payment source

## 2020-10-13 ENCOUNTER — Ambulatory Visit
Admission: RE | Admit: 2020-10-13 | Discharge: 2020-10-13 | Disposition: A | Discharge status: Home / Self Care | Payer: No Typology Code available for payment source | Source: Ambulatory Visit | Attending: Neurology | Admitting: Neurology

## 2020-10-13 ENCOUNTER — Other Ambulatory Visit: Payer: Self-pay

## 2020-10-13 DIAGNOSIS — G43719 Chronic migraine without aura, intractable, without status migrainosus: Secondary | ICD-10-CM

## 2020-10-13 DIAGNOSIS — M4120 Other idiopathic scoliosis, site unspecified: Secondary | ICD-10-CM

## 2020-10-13 DIAGNOSIS — R4020 Unspecified coma: Secondary | ICD-10-CM

## 2020-10-16 LAB — CUP PACEART REMOTE DEVICE CHECK
Date Time Interrogation Session: 20220207081411
Implantable Pulse Generator Implant Date: 20210823

## 2020-10-19 ENCOUNTER — Other Ambulatory Visit: Payer: Self-pay | Admitting: Neurology

## 2020-10-21 ENCOUNTER — Ambulatory Visit (INDEPENDENT_AMBULATORY_CARE_PROVIDER_SITE_OTHER): Payer: No Typology Code available for payment source

## 2020-10-21 DIAGNOSIS — R55 Syncope and collapse: Secondary | ICD-10-CM | POA: Diagnosis not present

## 2020-10-23 ENCOUNTER — Encounter: Payer: Self-pay | Admitting: *Deleted

## 2020-10-23 ENCOUNTER — Other Ambulatory Visit: Payer: Self-pay | Admitting: *Deleted

## 2020-10-23 DIAGNOSIS — G43719 Chronic migraine without aura, intractable, without status migrainosus: Secondary | ICD-10-CM

## 2020-10-23 MED ORDER — ONDANSETRON 4 MG PO TBDP: 4.0000 mg | ORAL_TABLET | Freq: Three times a day (TID) | ORAL | 6 refills | Status: DC | PRN

## 2020-10-23 NOTE — Telephone Encounter (Signed)
Sent my chart advising patient her insurance won't approve Ondansetron. Advised her of cheaper cost at at [facility] and [facility]. requested she let us know if we can sed Rx to one of those pharmacies.

## 2020-10-28 NOTE — Progress Notes (Signed)
Carelink Summary Report / Loop Recorder 

## 2020-11-21 LAB — CUP PACEART REMOTE DEVICE CHECK
Date Time Interrogation Session: 20220312081158
Implantable Pulse Generator Implant Date: 20210823

## 2020-11-25 ENCOUNTER — Ambulatory Visit (INDEPENDENT_AMBULATORY_CARE_PROVIDER_SITE_OTHER): Payer: No Typology Code available for payment source

## 2020-11-25 DIAGNOSIS — R55 Syncope and collapse: Secondary | ICD-10-CM

## 2020-12-03 NOTE — Progress Notes (Signed)
Carelink Summary Report / Loop Recorder 

## 2020-12-04 ENCOUNTER — Ambulatory Visit: Payer: No Typology Code available for payment source | Admitting: Cardiovascular Disease

## 2020-12-04 ENCOUNTER — Other Ambulatory Visit: Payer: Self-pay

## 2020-12-04 ENCOUNTER — Encounter: Payer: Self-pay | Admitting: Cardiovascular Disease

## 2020-12-04 VITALS — BP 140/98 | HR 77 | Ht 62.5 in | Wt 144.0 lb

## 2020-12-04 DIAGNOSIS — R079 Chest pain, unspecified: Secondary | ICD-10-CM

## 2020-12-04 DIAGNOSIS — R0600 Dyspnea, unspecified: Secondary | ICD-10-CM | POA: Diagnosis not present

## 2020-12-04 DIAGNOSIS — R55 Syncope and collapse: Secondary | ICD-10-CM | POA: Diagnosis not present

## 2020-12-04 DIAGNOSIS — R0609 Other forms of dyspnea: Secondary | ICD-10-CM

## 2020-12-04 DIAGNOSIS — E7800 Pure hypercholesterolemia, unspecified: Secondary | ICD-10-CM | POA: Diagnosis not present

## 2020-12-04 DIAGNOSIS — N1831 Chronic kidney disease, stage 3a: Secondary | ICD-10-CM

## 2020-12-04 NOTE — Progress Notes (Signed)
Cardiology Office Note:    Date:  12/05/2020   ID:  Nicole Morris, DOB [birth year], MRN [id number]  PCP:  Laurann Montana, MD  [facility] Cardiologist:  Thurmon Fair, MD  [facility] Electrophysiologist:  None   Referring MD: Laurann Montana, MD   Chief Complaint  Patient presents with  . Loss of Consciousness    History of Present Illness:    Nicole Morris is a 60 y.o. 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 Marisue Humble),  treated hypothyroidism, hypercholesterolemia on statin, history of left total hip replacement and limbs of unequal length, presenting for evaluation after an episode of syncope.  Since early precordial was implanted she has not had syncope but she has had 2 episodes of near syncope.  On both occasions she felt "hot" became short of breath and weak, her heart felt "jittery".  On both occasions she sat down and gradually felt better.  No clear trigger was evident, but she was standing on both occasions.  She used the symptom activator on her loop recorder and on both occasions there is evidence of gradual increase in heart rate consistent with sinus tachycardia, with subsequent gradual resolution.  On one tracing a single PVC is seen and there are occasional PACs.  There is no evidence of true arrhythmia.  She has occasional palpitations and occasional heartburn that is not associate with physical activity.  She denies orthopnea, PND, leg edema, focal neurological complaints.  She has had 3 separate back surgeries for scoliosis and has a lot of hardware in her thoracic and lumbar spine.  She has had a total of 23 surgeries including multiple procedures for congenital ureteral drainage problems and need for bladder augmentation.  The last surgery was performed in 1988.  She has a  neurogenic bladder and requires self-catheterization.  She has been on treatment with rosuvastatin for hypercholesterolemia.  She bears a diagnosis of CKD stage III.  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  Most recent creatinine was 1.19, better than her previous baseline of 1.3.  Her father Nicole Morris was my patient.  He passed away from liver cancer not long ago.  Her mother Nicole Morris is also my patient (She has interstitial lung disease and is suspected to have coronary disease.  Currently receiving treatment for lung cancer).  Past Medical History:  Diagnosis Date  . Anxiety   . Asthma   . Chronic lower back pain   . Dysrhythmia    pt got Tachy last time under anesthesia  . High cholesterol   . History of blood transfusion    "related to surgeries" (06/14/2018)  . Hypothyroidism   . Kidney disease, chronic, stage II (GFR 60-89 ml/min)    "one of my kidneys is in my abdomen" (06/14/2018)  . Migraine    "daily-weekly" (06/14/2018)  . Neuromuscular disorder (HCC)    neuropathy in legs from back surgery  . PONV (postoperative nausea and vomiting)   . Scoliosis     Past Surgical History:  Procedure Laterality Date  . ABDOMINAL HYSTERECTOMY  1988  . BACK SURGERY    . BLADDER AUGMENTATION  1988  . HERNIA REPAIR    . JOINT REPLACEMENT    . KNEE ARTHROSCOPY Right   . SPINAL  FUSION  2008   "w/harrington rods, etc"  . TOTAL HIP ARTHROPLASTY Left 06/14/2018  . TOTAL HIP ARTHROPLASTY Left 06/14/2018   Procedure: LEFT TOTAL HIP ARTHROPLASTY ANTERIOR APPROACH;  Surgeon: Kathryne Hitch, MD;  Location: [facility] OR;  Service: Orthopedics;  Laterality: Left;    Current Medications: Current Meds  Medication Sig  . albuterol (PROVENTIL HFA;VENTOLIN HFA) 108 (90 BASE) MCG/ACT inhaler Inhale 2 puffs into the lungs every 6 (six) hours as needed for  wheezing or shortness of breath.  . ALPRAZolam (XANAX) 1 MG tablet Take 1-2 tablets 30 minutes prior to MRI, may repeat once as needed. Must have driver.  Marland Kitchen buPROPion (WELLBUTRIN XL) 300 MG 24 hr tablet Take 300 mg by mouth daily.   . butalbital-acetaminophen-caffeine (FIORICET, ESGIC) 50-325-40 MG tablet Take 1 tablet by mouth daily as needed for headache.  . diclofenac sodium (VOLTAREN) 1 % GEL Apply 2 g topically daily as needed (joint pain).   . Fremanezumab-vfrm (AJOVY) 225 MG/1.5ML SOAJ Inject 1.5 mLs into the skin every 30 (thirty) days.  Marland Kitchen levothyroxine (SYNTHROID, LEVOTHROID) 50 MCG tablet Take 50 mcg by mouth daily before breakfast.  . LORazepam (ATIVAN) 0.5 MG tablet Take 0.25-0.5 mg by mouth every 8 (eight) hours as needed for anxiety.  . ondansetron (ZOFRAN) 4 MG tablet Take 1 tablet (4 mg total) by mouth as needed for nausea or vomiting.  . rosuvastatin (CRESTOR) 5 MG tablet Take 5 mg by mouth daily.   . sertraline (ZOLOFT) 100 MG tablet Take 100 mg by mouth every evening.   . SUMAtriptan (IMITREX) 50 MG tablet Take 1 tablet (50 mg total) by mouth every 2 (two) hours as needed. May repeat in 2 hours if headache persists or recurs.  . tapentadol (NUCYNTA) 50 MG 12 hr tablet Take 50 mg by mouth daily as needed (pain).   . triamcinolone (NASACORT ALLERGY 24HR) 55 MCG/ACT AERO nasal inhaler 2 sprays in each nostril   Current Facility-Administered Medications for the 12/04/20 encounter (Office Visit) with Thurmon Fair, MD  Medication  . lidocaine-EPINEPHrine (XYLOCAINE W/EPI) 1 %-1:100000 (with pres) injection 10 mL     Allergies:   Atorvastatin, Other, Codeine, and Mushroom extract complex   Social History   Socioeconomic History  . Marital status: Married    Spouse name: Not on file  . Number of children: 2  . Years of education: college  . Highest education level: Not on file  Occupational History  . Occupation: Public librarian  Tobacco Use  . Smoking status: Never  Smoker  . Smokeless tobacco: Never Used  Vaping Use  . Vaping Use: Never used  Substance and Sexual Activity  . Alcohol use: Yes    Comment: rare  . Drug use: Never  . Sexual activity: Not Currently  Other Topics Concern  . Not on file  Social History Narrative   Lives at home with husband.   Right-handed.   2-3 cups coffee per day.   Social Determinants of Health   Financial Resource Strain: Not on file  Food Insecurity: Not on file  Transportation Needs: Not on file  Physical Activity: Not on file  Stress: Not on file  Social Connections: Not on file     Family History: The patient's father had atrial fibrillation and passed away from metastatic liver cancer.  Her mother has interstitial lung disease, lung cancer and suspected coronary disease. ROS:   Please see the history of present illness.    All other systems are reviewed and are  negative.   EKGs/Labs/Other Studies Reviewed:    The following studies were reviewed today: Previous loop recorder downloads and active download in the office today.  There are no spontaneous recorded episodes of arrhythmia.  The patient activated recordings show sinus tachycardia.  EKG:  EKG is  ordered today.  Personally reviewed shows normal sinus rhythm, RSR prime pattern in lead V1, completely normal otherwise.  Recent Labs: 01/15/2020: ALT 13; BUN 13; Creatinine, Ser 1.21; Hemoglobin 12.4; Platelets 205; Potassium 3.8; Sodium 139  10/17/2020 Hemoglobin 12.4, creatinine 1.19, potassium 4.3, TSH 1.87, normal liver function tests Recent Lipid Panel No results found for: CHOL, TRIG, HDL, CHOLHDL, VLDL, LDLCALC, LDLDIRECT 10/17/2020 total cholesterol 199, HDL 65, LDL 106, triglycerides 99 Physical Exam:    VS:  BP (!) 140/98   Pulse 77   Ht 5' 2.5" (1.588 m)   Wt 144 lb (65.3 kg)   BMI 25.92 kg/m     Wt Readings from Last 3 Encounters:  12/04/20 144 lb (65.3 kg)  09/30/20 141 lb (64 kg)  03/26/20 146 lb 12.8 oz (66.6 kg)       General: Alert, oriented x3, no distress, prominent scoliosis Head: no evidence of trauma, PERRL, EOMI, no exophtalmos or lid lag, no myxedema, no xanthelasma; normal ears, nose and oropharynx Neck: normal jugular venous pulsations and no hepatojugular reflux; brisk carotid pulses without delay and no carotid bruits Chest: clear to auscultation, no signs of consolidation by percussion or palpation, normal fremitus, symmetrical and full respiratory excursions Cardiovascular: normal position and quality of the apical impulse, regular rhythm, normal first and second heart sounds, no murmurs, rubs or gallops Abdomen: no tenderness or distention, no masses by palpation, no abnormal pulsatility or arterial bruits, normal bowel sounds, no hepatosplenomegaly Extremities: no clubbing, cyanosis or edema; 2+ radial, ulnar and brachial pulses bilaterally; 2+ right femoral, posterior tibial and dorsalis pedis pulses; 2+ left femoral, posterior tibial and dorsalis pedis pulses; no subclavian or femoral bruits Neurological: grossly nonfocal, walks with a limp. Psych: Normal mood and affect   ASSESSMENT:    1. Syncope, unspecified syncope type   2. Exertional dyspnea   3. Chest pain at rest   4. Hypercholesterolemia   5. Stage 3a chronic kidney disease (HCC)    PLAN:    In order of problems listed above:  1. Syncope: As opposed to the index episode that happened while she was walking her dog, the last 2 events were milder and had clear-cut prodrome, strongly suggestive of vasovagal events.  The pattern of rising and falling heart rate with sinus mechanism also suggests an aborted episode of neurally mediated syncope.  No true arrhythmia has been detected recommended a relatively high sodium diet, aggressive hydration, avoiding triggers (if she can identify them) and (very importantly) always paying heed immediately to the prodrome and laying down to avoid full-blown syncope. 2. Shortness of breath:  This has not been consistently present, and not currently bothering her too much.  This is not present consistently.  Normal clinical cardiovascular exam.  Consider echocardiogram, but suspect noncardiac etiology.  Likely to have certain abnormalities of chest wall dynamics due to her prominent scoliosis. 3. Resting chest discomfort: This has not been a complaint recently.  Reviewed her CT of the chest abdomen and pelvis from a couple of months ago.  I cannot identify any evidence of calcification in the coronary artery distribution or for that matter in any of the segments or the aorta or branches of the aorta all the  way down to the femoral arteries.  Suspicion for severe vascular disease/coronary disease appears to be low.   Coronary risk factors are limited to mild hypercholesterolemia which is well treated with a statin. 4. HLP: Ideally would bring her LDL cholesterol down to less than 100 (she does not have established vascular disease or significant coronary calcification) she is slightly above that target on most recent labs. 5. CKD 3: Baseline creatinine 1.2-1.3, GFR 45-50; most recent creatinine 1.19.  Known congenital abnormalities of the urinary system with multiple surgical procedures and neurogenic bladder requiring self-catheterization.  Seems to have stable renal function for several years.  Nevertheless, would avoid nephrotoxic agents including unnecessary contrast based procedures.   Medication Adjustments/Labs and Tests Ordered: Current medicines are reviewed at length with the patient today.  Concerns regarding medicines are outlined above.  Orders Placed This Encounter  Procedures  . EKG 12-Lead   No orders of the defined types were placed in this encounter.   Patient Instructions  Medication Instructions:  No changes *If you need a refill on your cardiac medications before your next appointment, please call your pharmacy*   Lab Work: None ordered If you have labs (blood  work) drawn today and your tests are completely normal, you will receive your results only by: Marland Kitchen MyChart Message (if you have MyChart) OR . A paper copy in the mail If you have any lab test that is abnormal or we need to change your treatment, we will call you to review the results.   Testing/Procedures: None ordered   Follow-Up: At [facility], you and your health needs are our priority.  As part of our continuing mission to provide you with exceptional heart care, we have created designated Provider Care Teams.  These Care Teams include your primary Cardiologist (physician) and Advanced Practice Providers (APPs -  Physician Assistants and Nurse Practitioners) who all work together to provide you with the care you need, when you need it.  We recommend signing up for the patient portal called "MyChart".  Sign up information is provided on this After Visit Summary.  MyChart is used to connect with patients for Virtual Visits (Telemedicine).  Patients are able to view lab/test results, encounter notes, upcoming appointments, etc.  Non-urgent messages can be sent to your provider as well.   To learn more about what you can do with MyChart, go to [email or web address].    Your next appointment:   12 month(s)  The format for your next appointment:   In Person  Provider:   You may see Thurmon Fair, MD or one of the following Advanced Practice Providers on your designated Care Team:    Azalee Course, PA-C  Micah Flesher, [place] or   Judy Pimple, PA-C       Signed, Thurmon Fair, MD  12/05/2020 3:04 PM    [facility]

## 2020-12-04 NOTE — Patient Instructions (Signed)

## 2020-12-05 ENCOUNTER — Encounter: Payer: Self-pay | Admitting: Cardiovascular Disease

## 2020-12-19 ENCOUNTER — Ambulatory Visit (INDEPENDENT_AMBULATORY_CARE_PROVIDER_SITE_OTHER): Payer: No Typology Code available for payment source

## 2020-12-19 DIAGNOSIS — R55 Syncope and collapse: Secondary | ICD-10-CM

## 2020-12-19 LAB — CUP PACEART REMOTE DEVICE CHECK
Date Time Interrogation Session: 20220414081338
Implantable Pulse Generator Implant Date: 20210823

## 2020-12-31 ENCOUNTER — Ambulatory Visit: Payer: No Typology Code available for payment source | Admitting: Neurology

## 2021-01-01 ENCOUNTER — Telehealth: Payer: Self-pay | Admitting: *Deleted

## 2021-01-01 NOTE — Telephone Encounter (Signed)
PA approved through 01/01/2022.

## 2021-01-01 NOTE — Telephone Encounter (Signed)
PA for Ajovy started on covermymeds (key: [id number]). Pt has coverage w/ [employer] (D#. PA Case ID: [id number]. Decision pending.

## 2021-01-06 NOTE — Progress Notes (Signed)
Carelink Summary Report / Loop Recorder 

## 2021-01-09 NOTE — Addendum Note (Signed)
Addended by: Lonie Newsham D on: 01/09/2021 10:19 AM   Modules accepted: Level of Service  

## 2021-01-20 ENCOUNTER — Ambulatory Visit: Payer: No Typology Code available for payment source | Admitting: Neurology

## 2021-01-20 ENCOUNTER — Other Ambulatory Visit: Payer: Self-pay

## 2021-01-20 DIAGNOSIS — R4020 Unspecified coma: Secondary | ICD-10-CM | POA: Diagnosis not present

## 2021-01-20 DIAGNOSIS — G43719 Chronic migraine without aura, intractable, without status migrainosus: Secondary | ICD-10-CM

## 2021-01-20 DIAGNOSIS — M4120 Other idiopathic scoliosis, site unspecified: Secondary | ICD-10-CM

## 2021-01-21 ENCOUNTER — Ambulatory Visit (INDEPENDENT_AMBULATORY_CARE_PROVIDER_SITE_OTHER): Payer: No Typology Code available for payment source

## 2021-01-21 DIAGNOSIS — R55 Syncope and collapse: Secondary | ICD-10-CM

## 2021-01-21 LAB — CUP PACEART REMOTE DEVICE CHECK
Date Time Interrogation Session: 20220517081121
Implantable Pulse Generator Implant Date: 20210823

## 2021-01-21 NOTE — Procedures (Signed)
   HISTORY: 60 years old female presented with frequent headaches, also had passing out episode TECHNIQUE:  This is a routine 16 channel EEG recording with one channel devoted to a limited EKG recording.  It was performed during wakefulness, drowsiness and asleep.  Hyperventilation and photic stimulation were performed as activating procedures.  There are minimum muscle and movement artifact noted.  Upon maximum arousal, posterior dominant waking rhythm consistent of rhythmic alpha range activity. Activities are symmetric over the bilateral posterior derivations and attenuated with eye opening.  Hyperventilation produced mild/moderate buildup with higher amplitude and the slower activities noted.  Photic stimulation did not alter the tracing.  During EEG recording, patient developed drowsiness and entered sleep, sleep EEG demonstrated architecture, there were frontal centrally dominant vertex waves and symmetric sleep spindles noted.  During EEG recording, there was no epileptiform discharge noted.  EKG demonstrate sinus rhythm, with heart rate of 72 bpm  CONCLUSION: This is a  normal awake and sleep EEG.  There is no electrodiagnostic evidence of epileptiform discharge.  Levert Feinstein, M.D. Ph.D.  [facility] [address] [place], [place] [address] Phone: [phone] Fax:      [phone]

## 2021-01-30 ENCOUNTER — Ambulatory Visit: Payer: No Typology Code available for payment source | Admitting: Neurology

## 2021-02-13 NOTE — Progress Notes (Signed)
Carelink Summary Report / Loop Recorder 

## 2021-02-24 ENCOUNTER — Ambulatory Visit (INDEPENDENT_AMBULATORY_CARE_PROVIDER_SITE_OTHER): Payer: No Typology Code available for payment source

## 2021-02-24 DIAGNOSIS — R55 Syncope and collapse: Secondary | ICD-10-CM

## 2021-02-24 LAB — CUP PACEART REMOTE DEVICE CHECK
Date Time Interrogation Session: 20220619080952
Implantable Pulse Generator Implant Date: 20210823

## 2021-03-17 NOTE — Progress Notes (Signed)
Carelink Summary Report / Loop Recorder 

## 2021-03-31 ENCOUNTER — Ambulatory Visit (INDEPENDENT_AMBULATORY_CARE_PROVIDER_SITE_OTHER): Payer: No Typology Code available for payment source

## 2021-03-31 DIAGNOSIS — R55 Syncope and collapse: Secondary | ICD-10-CM

## 2021-04-01 ENCOUNTER — Encounter: Payer: Self-pay | Admitting: Neurology

## 2021-04-01 ENCOUNTER — Ambulatory Visit: Payer: No Typology Code available for payment source | Admitting: Neurology

## 2021-04-01 VITALS — BP 128/87 | HR 73 | Ht 62.0 in | Wt 143.0 lb

## 2021-04-01 DIAGNOSIS — Q059 Spina bifida, unspecified: Secondary | ICD-10-CM | POA: Insufficient documentation

## 2021-04-01 DIAGNOSIS — G43719 Chronic migraine without aura, intractable, without status migrainosus: Secondary | ICD-10-CM | POA: Diagnosis not present

## 2021-04-01 DIAGNOSIS — F32A Depression, unspecified: Secondary | ICD-10-CM | POA: Insufficient documentation

## 2021-04-01 DIAGNOSIS — M419 Scoliosis, unspecified: Secondary | ICD-10-CM | POA: Diagnosis not present

## 2021-04-01 MED ORDER — TIZANIDINE HCL 2 MG PO TABS: 2.0000 mg | ORAL_TABLET | Freq: Three times a day (TID) | ORAL | 3 refills | Status: DC | PRN

## 2021-04-01 MED ORDER — LAMOTRIGINE 25 MG PO TABS: 25.0000 mg | ORAL_TABLET | Freq: Every day | ORAL | 0 refills | Status: DC

## 2021-04-01 MED ORDER — RIZATRIPTAN BENZOATE 10 MG PO TBDP: 10.0000 mg | ORAL_TABLET | ORAL | 6 refills | Status: DC | PRN

## 2021-04-01 MED ORDER — LAMOTRIGINE 100 MG PO TABS: 100.0000 mg | ORAL_TABLET | Freq: Every day | ORAL | 3 refills | Status: DC

## 2021-04-01 NOTE — Patient Instructions (Addendum)
Meds ordered this encounter  Medications   lamoTRIgine (LAMICTAL) 25 MG tablet---first 3 weeks.    Sig: Take 1 tablet (25 mg total) by mouth at bedtime. 1 tab every night x one week 2 tab every night x 2nd week 3 tab every night x 3rd week    Dispense:  42 tablet    Refill:  0   lamoTRIgine (LAMICTAL) 100 MG tablet    Sig: Take 1 tablet (100 mg total) by mouth at bedtime.    Dispense:  90 tablet    Refill:  3   rizatriptan (MAXALT-MLT) 10 MG disintegrating tablet---to replace Imitrex, sumatriptan, do not overlap    Sig: Take 1 tablet (10 mg total) by mouth as needed for migraine. May repeat in 2 hours if needed    Dispense:  10 tablet    Refill:  6   tiZANidine (ZANAFLEX) 2 MG tablet    Sig: Take 1 tablet (2 mg total) by mouth every 8 (eight) hours as needed for muscle spasms.    Dispense:  30 tablet    Refill:  3     May add on Tylenol, even benadryl for prolonged severe headache,

## 2021-04-01 NOTE — Progress Notes (Signed)
Chief Complaint  Patient presents with   Follow-up    Room 13, alone, reports no changes, c/o weekly migraines,   ASSESSMENT AND PLAN  Nicole Morris is a 60 y.o. female   History of spina bifida, extensive lumbar decompression surgery in the past, Chronic migraine Passing out spells in May 2021, status post loop recorder since August 2021, no significant pathology found so far     MRI of brain without contrast in February 2022 showed mild small vessel disease  EEG was normal   Ajovy as migraine prevention, since January 2022, she tolerating it well, it did help her headache at least 50%  Imitrex 50 mg as needed was helpful, but complains of side effect of feeling heavy, fatigue, will try Maxalt 10 mg as needed, may combine with tizanidine, Tylenol for prolonged severe headaches  DIAGNOSTIC DATA (LABS, IMAGING, TESTING) - I reviewed patient records, labs, notes, testing and imaging myself where available.  MRI of the brain February 2022, mild small vessel disease no acute abnormality EEG was normal in May 2022   HISTORICAL  Nicole Morris is a 60 year old female, seen in request by her primary care physician Dr. Laurann Montana, for evaluation of frequent headaches, initial evaluation was on 09-30-2020  I reviewed and summarized the referring note. PMHX. Hypothyroidism, on supplement Hyperlipidemia, on Crestor 5 mg every night Anxiety, depression, taking Zoloft 100 mg daily, Wellbutrin 300 mg daily,: History of spinal bifida, scoliosis, left hip replacement, lumbar decompression surgery, pain management by Dr. Thyra Breed, taking Nucynta help 50 mg daily,  She reported a history of migraine headaches since childhood, gradually getting worse, since 2019, she began to have daily headaches, usually starting from her neck, spreading forward to become occipital area sometimes bilateral frontal behind eye pressure headaches, she complains of 6 out of 10 daily pressure headache, for  that reason, over the past 2 years, she has been taking Fioricet every day,  about once a week, she would get more severe headache with light noise sensitivity, nauseous, movement made her headache worse, sometimes preceded by visual auras, lasting for hours, sleep usually helps,  She never tried triptan treatment in the past, is not taking preventive medications, denies a history of stroke and coronary artery disease  She had a history of spina bifid, had extensive back surgery in the past, also had a history of bilateral hip replacement,  She also reported 1 episode of unexplained passing out episode on Jan 15, 2020, there was no warning, she passed out from a standing position,  She had extensive bladder surgery since childhood, relied on self cath since 1988, she denies bowel incontinence, no gait abnormalities.  Laboratory evaluation in May 2021, CPK 146, negative troponin, CMP, creatinine 1.12, CBC, with hemoglobin of 9.4   UPDATE July 26th 2022: She reported at least 50% improvement after starting Ajovy, her headache is less severe, less frequent, no significant side effect noted  She did try Imitrex 50 mg as needed, which takes away her headache fairly effectively, but she complains of extreme fatigue, heaviness sensation of the back  She complains of chronic stress, is dealing with depression, taking Wellbutrin 300 mg and also Zoloft 100 mg, still has difficulty sleeping,  We personally reviewed MRI of the brain in February 2022, no acute abnormality, mild small vessel disease  EEG was normal, she has normal passing out episode, had a cardiac monitoring, since August 2021, no significant pathology found  PHYSICAL EXAM   Vitals:   04/01/21  0742  BP: 128/87  Pulse: 73  Weight: 143 lb (64.9 kg)  Height: 5\' 2"  (1.575 m)   Not recorded     Body mass index is 26.16 kg/m.  PHYSICAL EXAMNIATION:  Gen: NAD, conversant, well nourised, well groomed                      Cardiovascular: Regular rate rhythm, no peripheral edema, warm, nontender. Eyes: Conjunctivae clear without exudates or hemorrhage Neck: Supple, no carotid bruits. Pulmonary: Clear to auscultation bilaterally   NEUROLOGICAL EXAM:  MENTAL STATUS: Speech:    Speech is normal; fluent and spontaneous with normal comprehension.  Cognition:     Orientation to time, place and person     Normal recent and remote memory     Normal Attention span and concentration     Normal Language, naming, repeating,spontaneous speech     Fund of knowledge   CRANIAL NERVES: CN II: Visual fields are full to confrontation. Pupils are round equal and briskly reactive to light. CN III, IV, VI: extraocular movement are normal. No ptosis. CN V: Facial sensation is intact to light touch CN VII: Face is symmetric with normal eye closure  CN VIII: Hearing is normal to causal conversation. CN IX, X: Phonation is normal. CN XI: Head turning and shoulder shrug are intact  MOTOR: There is no pronator drift of out-stretched arms. Muscle bulk and tone are normal. Muscle strength is normal.  REFLEXES: Reflexes are 1 and symmetric at the biceps, triceps, knees, and ankles. Plantar responses are flexor.  SENSORY: Intact to light touch,   COORDINATION: There is no trunk or limb dysmetria noted.  GAIT/STANCE: She has scoliosis, body leaning towards the left side, steady  REVIEW OF SYSTEMS:  Full 14 system review of systems performed and notable only for as above All other review of systems were negative.  ALLERGIES: Allergies  Allergen Reactions   Atorvastatin     Other reaction(s): joint pain   Other Other (See Comments)    Darvocet N "doesn't like the way it makes her feel"   Codeine Nausea Only   Mushroom Extract Complex Nausea Only    Nausea     HOME MEDICATIONS: Current Outpatient Medications  Medication Sig Dispense Refill   albuterol (PROVENTIL HFA;VENTOLIN HFA) 108 (90 BASE) MCG/ACT inhaler  Inhale 2 puffs into the lungs every 6 (six) hours as needed for wheezing or shortness of breath.     buPROPion (WELLBUTRIN XL) 300 MG 24 hr tablet Take 300 mg by mouth daily.      butalbital-acetaminophen-caffeine (FIORICET, ESGIC) 50-325-40 MG tablet Take 1 tablet by mouth daily as needed for headache.     diclofenac sodium (VOLTAREN) 1 % GEL Apply 2 g topically daily as needed (joint pain).      Fremanezumab-vfrm (AJOVY) 225 MG/1.5ML SOAJ Inject 1.5 mLs into the skin every 30 (thirty) days. 1.5 mL 11   levothyroxine (SYNTHROID, LEVOTHROID) 50 MCG tablet Take 50 mcg by mouth daily before breakfast.     LORazepam (ATIVAN) 0.5 MG tablet Take 0.25-0.5 mg by mouth every 8 (eight) hours as needed for anxiety.     ondansetron (ZOFRAN) 4 MG tablet Take 1 tablet (4 mg total) by mouth as needed for nausea or vomiting. 15 tablet 3   rosuvastatin (CRESTOR) 5 MG tablet Take 5 mg by mouth daily.      sertraline (ZOLOFT) 100 MG tablet Take 100 mg by mouth every evening.      SUMAtriptan (IMITREX)  50 MG tablet Take 1 tablet (50 mg total) by mouth every 2 (two) hours as needed. May repeat in 2 hours if headache persists or recurs. 12 tablet 6   tapentadol (NUCYNTA) 50 MG 12 hr tablet Take 50 mg by mouth daily as needed (pain).      triamcinolone (NASACORT) 55 MCG/ACT AERO nasal inhaler 2 sprays in each nostril     No current facility-administered medications for this visit.    PAST MEDICAL HISTORY: Past Medical History:  Diagnosis Date   Anxiety    Asthma    Chronic lower back pain    Dysrhythmia    pt got Tachy last time under anesthesia   High cholesterol    History of blood transfusion    "related to surgeries" (06/14/2018)   Hypothyroidism    Kidney disease, chronic, stage II (GFR 60-89 ml/min)    "one of my kidneys is in my abdomen" (06/14/2018)   Migraine    "daily-weekly" (06/14/2018)   Neuromuscular disorder (HCC)    neuropathy in legs from back surgery   PONV (postoperative nausea and  vomiting)    Scoliosis     PAST SURGICAL HISTORY: Past Surgical History:  Procedure Laterality Date   ABDOMINAL HYSTERECTOMY  1988   BACK SURGERY     BLADDER AUGMENTATION  1988   HERNIA REPAIR     JOINT REPLACEMENT     KNEE ARTHROSCOPY Right    SPINAL FUSION  2008   "w/harrington rods, etc"   TOTAL HIP ARTHROPLASTY Left 06/14/2018   TOTAL HIP ARTHROPLASTY Left 06/14/2018   Procedure: LEFT TOTAL HIP ARTHROPLASTY ANTERIOR APPROACH;  Surgeon: Kathryne Hitch, MD;  Location: [facility] OR;  Service: Orthopedics;  Laterality: Left;    FAMILY HISTORY: Family History  Problem Relation Age of Onset   Lung cancer Mother    Liver cancer Father     SOCIAL HISTORY: Social History   Socioeconomic History   Marital status: Married    Spouse name: Not on file   Number of children: 2   Years of education: college   Highest education level: Not on file  Occupational History   Occupation: Public librarian  Tobacco Use   Smoking status: Never   Smokeless tobacco: Never  Vaping Use   Vaping Use: Never used  Substance and Sexual Activity   Alcohol use: Yes    Comment: rare   Drug use: Never   Sexual activity: Not Currently  Other Topics Concern   Not on file  Social History Narrative   Lives at home with husband.   Right-handed.   2-3 cups coffee per day.   Social Determinants of Health   Financial Resource Strain: Not on file  Food Insecurity: Not on file  Transportation Needs: Not on file  Physical Activity: Not on file  Stress: Not on file  Social Connections: Not on file  Intimate Partner Violence: Not on file         Levert Feinstein, M.D. Ph.D.  [facility] [address], [address] [place], [place] [address] Ph: [phone] Fax: [phone]  CC:  Laurann Montana, MD [phone] [address] [place],  [place] [address]

## 2021-04-02 LAB — CUP PACEART REMOTE DEVICE CHECK
Date Time Interrogation Session: 20220725104316
Implantable Pulse Generator Implant Date: 20210823

## 2021-04-25 NOTE — Progress Notes (Signed)
Carelink Summary Report / Loop Recorder 

## 2021-05-05 ENCOUNTER — Ambulatory Visit (INDEPENDENT_AMBULATORY_CARE_PROVIDER_SITE_OTHER): Payer: No Typology Code available for payment source

## 2021-05-05 DIAGNOSIS — R55 Syncope and collapse: Secondary | ICD-10-CM | POA: Diagnosis not present

## 2021-05-06 LAB — CUP PACEART REMOTE DEVICE CHECK
Date Time Interrogation Session: 20220824081417
Implantable Pulse Generator Implant Date: 20210823

## 2021-05-19 NOTE — Progress Notes (Signed)
Carelink Summary Report / Loop Recorder 

## 2021-05-21 IMAGING — CT CT HEAD W/O CM
5 of 8 series · 15 of 47 positions shown, 16 images · non-contrast
Comparison: 08/12/2016

CLINICAL DATA: Fall with laceration to the forehead.

EXAM:
CT HEAD WITHOUT CONTRAST
CT CERVICAL SPINE WITHOUT CONTRAST
TECHNIQUE: Multidetector CT imaging of the head and cervical spine was
performed following the standard protocol without intravenous
contrast. Multiplanar CT image reconstructions of the cervical spine
were also generated.

[Series 3: head wo · axial · 0.48mm/px · z∈[+1177,+1337]mm · 3 of 33 slices shown, 4 images]
[im 1/33  brain]
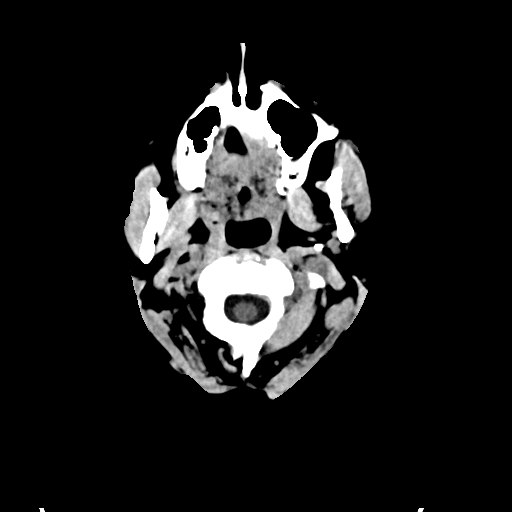
[im 1/33  bone]
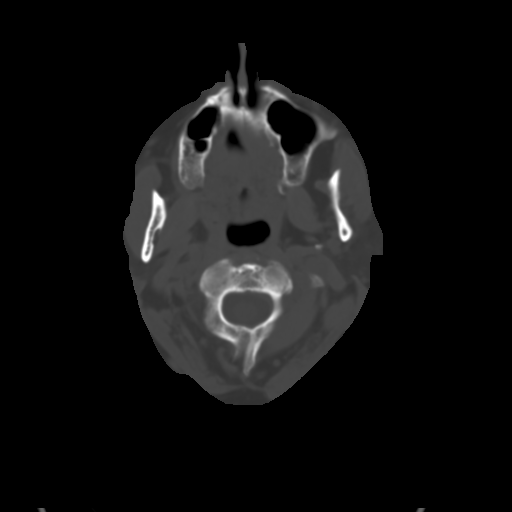
[im 17/33  brain]
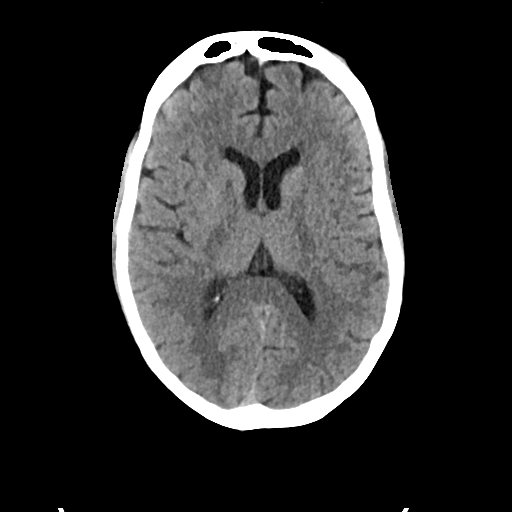
[im 33/33  brain]
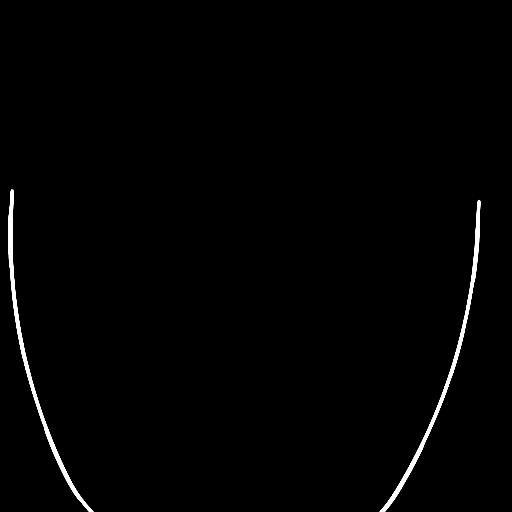

[Series 4: head bone · axial · 0.48mm/px · 1 of 82 slices shown]
[im 11/82  bone]
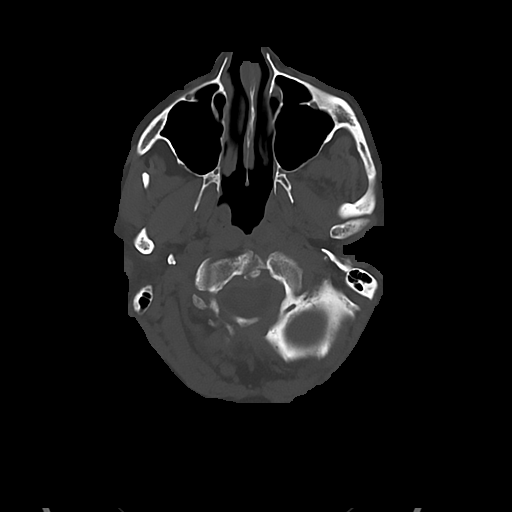

[Series 6: sag soft · sagittal · 0.31mm/px · 1 of 54 slices shown]
[im 27/54  brain]
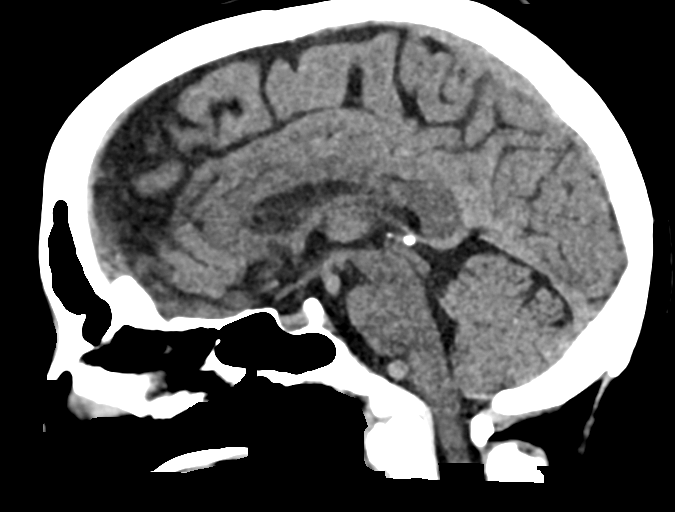

[Series 12: cor bone · coronal · 0.27mm/px · 3 of 56 slices shown]
[im 36/56  brain]
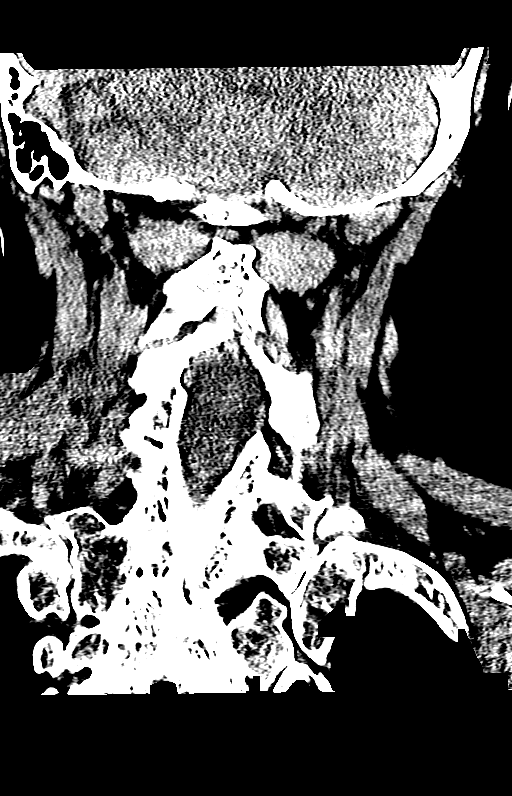
[im 40/56  brain]
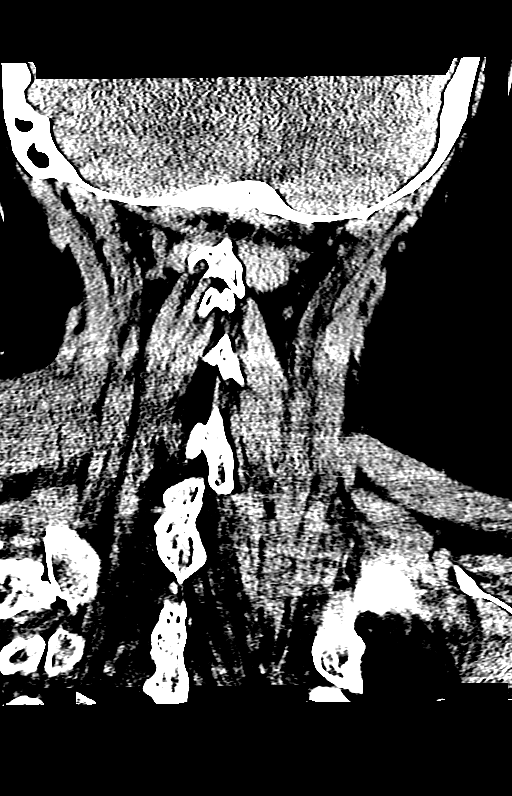
[im 44/56  brain]
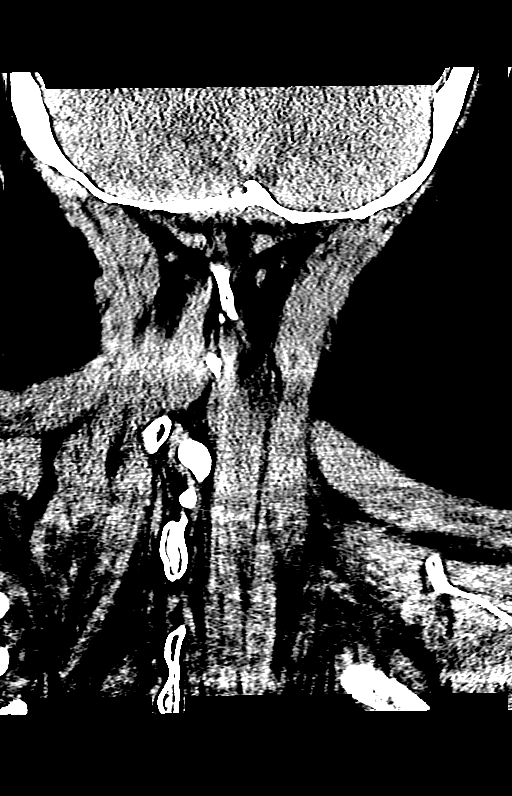

[Series 14: orthogonal axials · axial · 0.21mm/px · z∈[+1075,+1175]mm · 7 of 79 slices shown]
[im 10/79  brain]
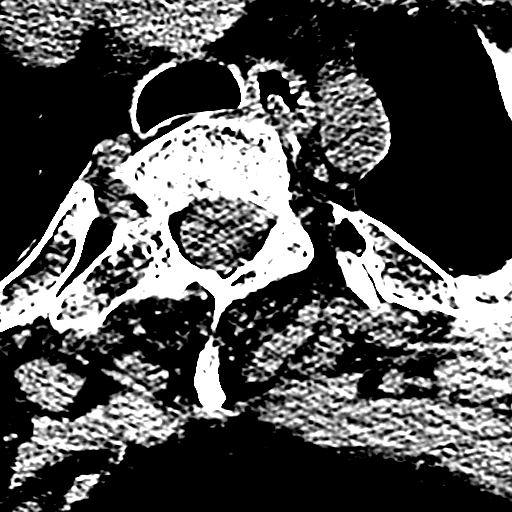
[im 20/79  brain]
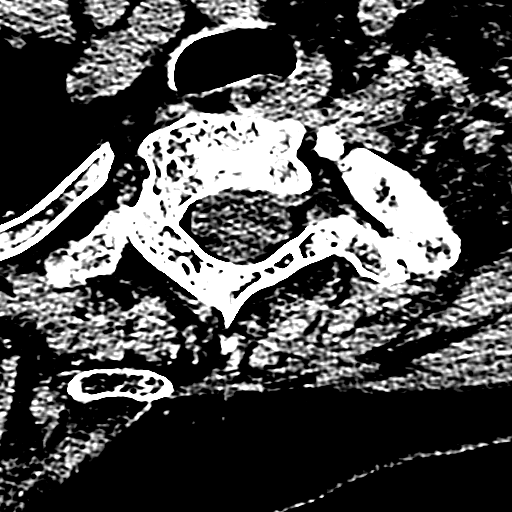
[im 30/79  brain]
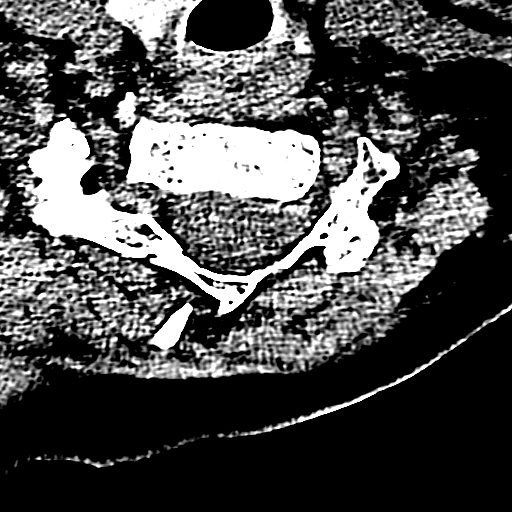
[im 40/79  brain]
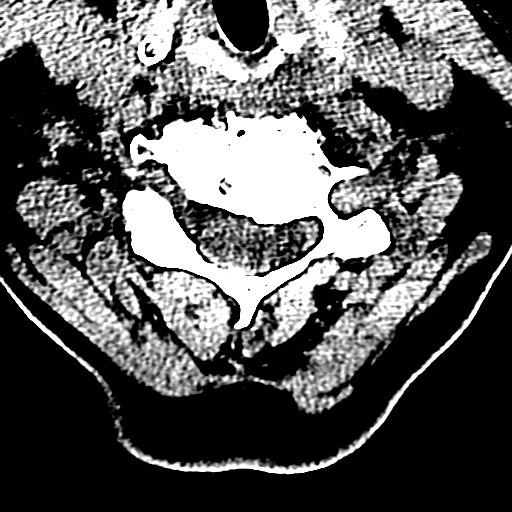
[im 49/79  brain]
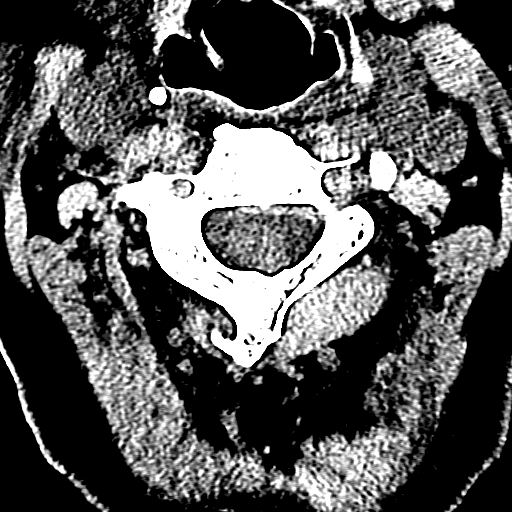
[im 59/79  brain]
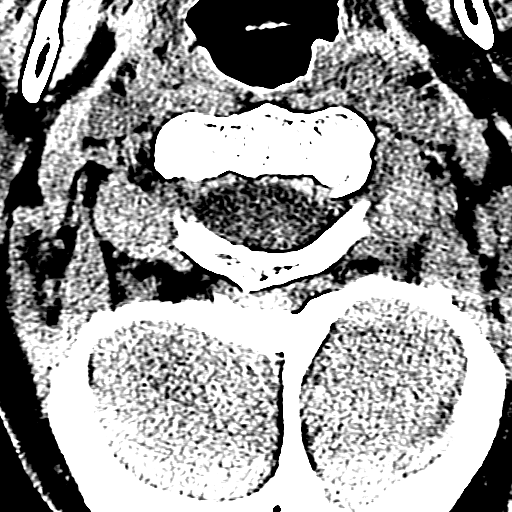
[im 69/79  brain]
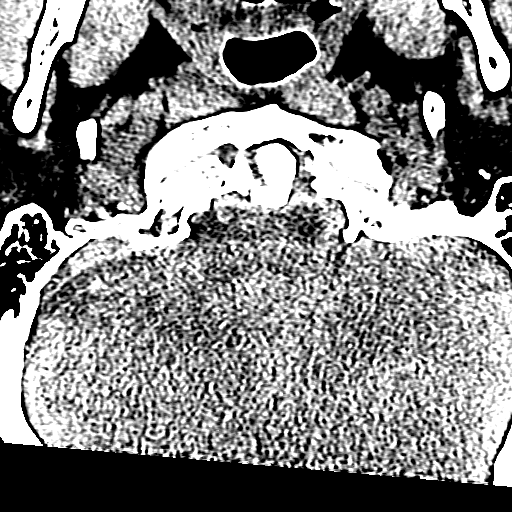

[15 of 47 positions shown; findings below may reference images not displayed]

FINDINGS: CT HEAD FINDINGS

Brain: No evidence of old or acute infarction, mass lesion,
hemorrhage, hydrocephalus or extra-axial collection.

Vascular: Slight dolichoectasia of the major vessels at the base of
the brain.

Skull: No skull fracture.

Sinuses/Orbits: Clear/normal

Other: Left forehead scalp injury. No sign of radiopaque foreign
object.

CT CERVICAL SPINE FINDINGS

Alignment: Chronic kyphotic deformity in the C3 through C5 region.

Skull base and vertebrae: Congenital failure of separation from C2
through C3 and from C5 into the thoracic region consistent with
Lottie deformity.

Soft tissues and spinal canal: Negative

Disc levels: Foramen magnum sufficiently patent. C1-2 level is
unremarkable. Congenital failure of segmentation at C2-3 but with
sufficient patency of the canal and foramina.

At C3-4, there is chronic degenerative spondylosis with disc space
narrowing, vacuum phenomenon, endplate osteophytes and facet
arthropathy on the right. There is canal stenosis and right
foraminal stenosis that could be symptomatic

At C4-5, there is an appearance of acquired fusion. There is been
previous posterior decompression. Chronic anterior canal
encroachment secondary to the endplate osteophytes.

C5 through thoracic region. Congenital failure of segmentation with
apparent sufficient patency of the canal and foramina.

Upper chest: Negative

Other: None
IMPRESSION: Head CT: Normal except for a left forehead scalp injury.

Cervical spine CT: No acute or traumatic finding. Chronic congenital
failure of segmentation at C2 and C3 and from C5 into the thoracic
region. Acquired fusion at the C4-5 level.

Chronic degenerative spondylosis at C3-4. Right facet arthropathy.
Canal stenosis and right foraminal stenosis as seen previously.

## 2021-06-09 ENCOUNTER — Ambulatory Visit (INDEPENDENT_AMBULATORY_CARE_PROVIDER_SITE_OTHER): Payer: No Typology Code available for payment source

## 2021-06-09 DIAGNOSIS — R55 Syncope and collapse: Secondary | ICD-10-CM

## 2021-06-09 LAB — CUP PACEART REMOTE DEVICE CHECK
Date Time Interrogation Session: 20220926081344
Implantable Pulse Generator Implant Date: 20210823

## 2021-06-17 NOTE — Progress Notes (Signed)
Carelink Summary Report / Loop Recorder 

## 2021-07-08 LAB — CUP PACEART REMOTE DEVICE CHECK
Date Time Interrogation Session: 20221029081508
Implantable Pulse Generator Implant Date: 20210823

## 2021-07-14 ENCOUNTER — Ambulatory Visit (INDEPENDENT_AMBULATORY_CARE_PROVIDER_SITE_OTHER): Payer: No Typology Code available for payment source

## 2021-07-14 DIAGNOSIS — R55 Syncope and collapse: Secondary | ICD-10-CM | POA: Diagnosis not present

## 2021-07-22 NOTE — Progress Notes (Signed)
Carelink Summary Report / Loop Recorder 

## 2021-08-18 ENCOUNTER — Ambulatory Visit (INDEPENDENT_AMBULATORY_CARE_PROVIDER_SITE_OTHER): Payer: No Typology Code available for payment source

## 2021-08-18 DIAGNOSIS — R55 Syncope and collapse: Secondary | ICD-10-CM | POA: Diagnosis not present

## 2021-08-19 LAB — CUP PACEART REMOTE DEVICE CHECK
Date Time Interrogation Session: 20221201081256
Implantable Pulse Generator Implant Date: 20210823

## 2021-08-28 NOTE — Progress Notes (Signed)
Carelink Summary Report / Loop Recorder 

## 2021-09-22 ENCOUNTER — Ambulatory Visit (INDEPENDENT_AMBULATORY_CARE_PROVIDER_SITE_OTHER): Payer: No Typology Code available for payment source

## 2021-09-22 DIAGNOSIS — R55 Syncope and collapse: Secondary | ICD-10-CM | POA: Diagnosis not present

## 2021-09-22 LAB — CUP PACEART REMOTE DEVICE CHECK
Date Time Interrogation Session: 20230115230645
Implantable Pulse Generator Implant Date: 20210823

## 2021-10-01 NOTE — Progress Notes (Signed)
PATIENT: Nancie Page-Shinn DOB: 03/13/1961  REASON FOR VISIT: Follow up for migraine headache  HISTORY FROM: Patient PRIMARY NEUROLOGIST: Dr. Krista Blue   HISTORY Winter Page-Shinn is a 61 year old female, seen in request by her primary care physician Dr. Harlan Stains, for evaluation of frequent headaches, initial evaluation was on 09-30-2020   I reviewed and summarized the referring note. PMHX. Hypothyroidism, on supplement Hyperlipidemia, on Crestor 5 mg every night Anxiety, depression, taking Zoloft 100 mg daily, Wellbutrin 300 mg daily,: History of spinal bifida, scoliosis, left hip replacement, lumbar decompression surgery, pain management by Dr. Nicholaus Bloom, taking Nucynta help 50 mg daily,   She reported a history of migraine headaches since childhood, gradually getting worse, since 2019, she began to have daily headaches, usually starting from her neck, spreading forward to become occipital area sometimes bilateral frontal behind eye pressure headaches, she complains of 6 out of 10 daily pressure headache, for that reason, over the past 2 years, she has been taking Fioricet every day,  about once a week, she would get more severe headache with light noise sensitivity, nauseous, movement made her headache worse, sometimes preceded by visual auras, lasting for hours, sleep usually helps,   She never tried triptan treatment in the past, is not taking preventive medications, denies a history of stroke and coronary artery disease   She had a history of spina bifid, had extensive back surgery in the past, also had a history of bilateral hip replacement,   She also reported 1 episode of unexplained passing out episode on Jan 15, 2020, there was no warning, she passed out from a standing position,   She had extensive bladder surgery since childhood, relied on self cath since 1988, she denies bowel incontinence, no gait abnormalities.   Laboratory evaluation in May 2021, CPK 146, negative  troponin, CMP, creatinine 1.12, CBC, with hemoglobin of 9.4    UPDATE July 26th 2022: She reported at least 50% improvement after starting Ajovy, her headache is less severe, less frequent, no significant side effect noted  She did try Imitrex 50 mg as needed, which takes away her headache fairly effectively, but she complains of extreme fatigue, heaviness sensation of the back  She complains of chronic stress, is dealing with depression, taking Wellbutrin 300 mg and also Zoloft 100 mg, still has difficulty sleeping,  We personally reviewed MRI of the brain in February 2022, no acute abnormality, mild small vessel disease  EEG was normal, she has normal passing out episode, had a cardiac monitoring, since August 2021, no significant pathology found   Update October 02, 2021 SS: Stopped the [place], several months ago, didn't tell much difference, had trouble giving herself the injection, nurse neighbor was giving it to her. Probably tried at least 5 months. Many headaches come from neck with migraine features radiating forward localizing behind the eye with light sensitivity, getting trigger point injections from pain management with Dr. Hardin Negus, put her back on Fioricet, takes 3 times a week. Bad migraines are once a week, takes Maxalt and tizanidine and works very well. Right now happy that she has handle of headaches. Never took the Lamictal. No recent passing out spells. The last was September when standing after running up the stairs to laundry room, got dizzy, felt fast heart beat, she sat down. Loop recorder has been normal. Last trigger point injection was in December, had series of 3. Is going next week. Has at least 4 headaches a week.   REVIEW OF SYSTEMS: Out  of a complete 14 system review of symptoms, the patient complains only of the following symptoms, and all other reviewed systems are negative.  See HPI  ALLERGIES: Allergies  Allergen Reactions   Atorvastatin     Other  reaction(s): joint pain   Other Other (See Comments)    Darvocet N "doesn't like the way it makes her feel"   Codeine Nausea Only   Mushroom Extract Complex Nausea Only    Nausea     HOME MEDICATIONS: Outpatient Medications Prior to Visit  Medication Sig Dispense Refill   albuterol (PROVENTIL HFA;VENTOLIN HFA) 108 (90 BASE) MCG/ACT inhaler Inhale 2 puffs into the lungs every 6 (six) hours as needed for wheezing or shortness of breath.     buPROPion (WELLBUTRIN XL) 300 MG 24 hr tablet Take 300 mg by mouth daily.      butalbital-acetaminophen-caffeine (FIORICET, ESGIC) 50-325-40 MG tablet Take 1 tablet by mouth daily as needed for headache.     diclofenac sodium (VOLTAREN) 1 % GEL Apply 2 g topically daily as needed (joint pain).      Fremanezumab-vfrm (AJOVY) 225 MG/1.5ML SOAJ Inject 1.5 mLs into the skin every 30 (thirty) days. 1.5 mL 11   lamoTRIgine (LAMICTAL) 100 MG tablet Take 1 tablet (100 mg total) by mouth at bedtime. 90 tablet 3   lamoTRIgine (LAMICTAL) 25 MG tablet Take 1 tablet (25 mg total) by mouth at bedtime. 1 tab every night x one week 2 tab every night x 2nd week 3 tab every night x 3rd week 42 tablet 0   levothyroxine (SYNTHROID, LEVOTHROID) 50 MCG tablet Take 50 mcg by mouth daily before breakfast.     LORazepam (ATIVAN) 0.5 MG tablet Take 0.25-0.5 mg by mouth every 8 (eight) hours as needed for anxiety.     ondansetron (ZOFRAN) 4 MG tablet Take 1 tablet (4 mg total) by mouth as needed for nausea or vomiting. 15 tablet 3   rizatriptan (MAXALT-MLT) 10 MG disintegrating tablet Take 1 tablet (10 mg total) by mouth as needed for migraine. May repeat in 2 hours if needed 10 tablet 6   rosuvastatin (CRESTOR) 5 MG tablet Take 5 mg by mouth daily.      sertraline (ZOLOFT) 100 MG tablet Take 100 mg by mouth every evening.      tapentadol (NUCYNTA) 50 MG 12 hr tablet Take 50 mg by mouth daily as needed (pain).      tiZANidine (ZANAFLEX) 2 MG tablet Take 1 tablet (2 mg total) by  mouth every 8 (eight) hours as needed for muscle spasms. 30 tablet 3   triamcinolone (NASACORT) 55 MCG/ACT AERO nasal inhaler 2 sprays in each nostril     No facility-administered medications prior to visit.    PAST MEDICAL HISTORY: Past Medical History:  Diagnosis Date   Anxiety    Asthma    Chronic lower back pain    Dysrhythmia    pt got Tachy last time under anesthesia   High cholesterol    History of blood transfusion    "related to surgeries" (06/14/2018)   Hypothyroidism    Kidney disease, chronic, stage II (GFR 60-89 ml/min)    "one of my kidneys is in my abdomen" (06/14/2018)   Migraine    "daily-weekly" (06/14/2018)   Neuromuscular disorder (HCC)    neuropathy in legs from back surgery   PONV (postoperative nausea and vomiting)    Scoliosis     PAST SURGICAL HISTORY: Past Surgical History:  Procedure Laterality Date   ABDOMINAL  HYSTERECTOMY  1988   BACK SURGERY     BLADDER AUGMENTATION  1988   HERNIA REPAIR     JOINT REPLACEMENT     KNEE ARTHROSCOPY Right    SPINAL FUSION  2008   "w/harrington rods, etc"   TOTAL HIP ARTHROPLASTY Left 06/14/2018   TOTAL HIP ARTHROPLASTY Left 06/14/2018   Procedure: LEFT TOTAL HIP ARTHROPLASTY ANTERIOR APPROACH;  Surgeon: Kathryne Hitch, MD;  Location: [facility] OR;  Service: Orthopedics;  Laterality: Left;    FAMILY HISTORY: Family History  Problem Relation Age of Onset   Lung cancer Mother    Liver cancer Father     SOCIAL HISTORY: Social History   Socioeconomic History   Marital status: Married    Spouse name: Not on file   Number of children: 2   Years of education: college   Highest education level: Not on file  Occupational History   Occupation: Public librarian  Tobacco Use   Smoking status: Never   Smokeless tobacco: Never  Vaping Use   Vaping Use: Never used  Substance and Sexual Activity   Alcohol use: Yes    Comment: rare   Drug use: Never   Sexual activity: Not Currently  Other Topics Concern    Not on file  Social History Narrative   Lives at home with husband.   Right-handed.   2-3 cups coffee per day.   Social Determinants of Health   Financial Resource Strain: Not on file  Food Insecurity: Not on file  Transportation Needs: Not on file  Physical Activity: Not on file  Stress: Not on file  Social Connections: Not on file  Intimate Partner Violence: Not on file   PHYSICAL EXAM  Vitals:   10/02/21 0744  BP: 125/82  Pulse: 89  Weight: 137 lb (62.1 kg)  Height: 5\' 2"  (1.575 m)   Body mass index is 25.06 kg/m.  Generalized: Well developed, in no acute distress   Neurological examination  Mentation: Alert oriented to time, place, history taking. Follows all commands speech and language fluent Cranial nerve II-XII: Pupils were equal round reactive to light. Extraocular movements were full, visual field were full on confrontational test. Facial sensation and strength were normal. Head turning and shoulder shrug  were normal and symmetric. Motor: The motor testing reveals 5 over 5 strength of all 4 extremities. Good symmetric motor tone is noted throughout.  Sensory: Sensory testing is intact to soft touch on all 4 extremities. No evidence of extinction is noted.  Coordination: Cerebellar testing reveals good finger-nose-finger and heel-to-shin bilaterally.  Gait and station: Gait is left sided lean, has scoliosis  Reflexes: Deep tendon reflexes are symmetric but decreased   DIAGNOSTIC DATA (LABS, IMAGING, TESTING) - I reviewed patient records, labs, notes, testing and imaging myself where available.  Lab Results  Component Value Date   WBC 5.5 01/15/2020   HGB 12.4 01/15/2020   HCT 37.2 01/15/2020   MCV 92.8 01/15/2020   PLT 205 01/15/2020      Component Value Date/Time   NA 139 01/15/2020 2002   K 3.8 01/15/2020 2002   CL 105 01/15/2020 2002   CO2 19 (L) 01/15/2020 2002   GLUCOSE 116 (H) 01/15/2020 2002   BUN 13 01/15/2020 2002   CREATININE 1.21 (H)  01/15/2020 2002   CALCIUM 9.4 01/15/2020 2002   PROT 7.2 01/15/2020 2002   ALBUMIN 3.9 01/15/2020 2002   AST 18 01/15/2020 2002   ALT 13 01/15/2020 2002   ALKPHOS 105 01/15/2020  2002   BILITOT 0.6 01/15/2020 2002   GFRNONAA 49 (L) 01/15/2020 2002   GFRAA 57 (L) 01/15/2020 2002   No results found for: CHOL, HDL, LDLCALC, LDLDIRECT, TRIG, CHOLHDL No results found for: HGBA1C Lab Results  Component Value Date   VITAMINB12 230 02/23/2018   No results found for: TSH  ASSESSMENT AND PLAN 61 y.o. year old female  has a past medical history of Anxiety, Asthma, Chronic lower back pain, Dysrhythmia, High cholesterol, History of blood transfusion, Hypothyroidism, Kidney disease, chronic, stage II (GFR 60-89 ml/min), Migraine, Neuromuscular disorder ([place]), PONV (postoperative nausea and vomiting), and Scoliosis. here with:   History of spina bifida, extensive lumbar decompressive surgery  Chronic migraine headache  Passing out spells in May 2021, status post loop recorder since August 2021  -MRI of the brain without contrast in February 2022 showed mild small vessel disease -EEG was normal -Try Botox therapy for chronic migraine headaches (at least 4 headaches a week with migraine features) -Previously tried Ajovy stopped, due to lack of benefit; already taking Wellbutrin, Zoloft, Fioricet, Maxalt, tizanidine, Ativan, Nucynta -Continue Maxalt 10 mg as needed for acute headache, may combine with tizanidine, Tylenol for prolonged headache -Never took Lamictal for passing out spells, no recent significant episodes, has loop recorder, sees cardiology -Return back in 6 months or sooner if needed   Evangeline Dakin, DNP 10/02/2021, 7:45 AM [facility] [address], [place], [place] [address] [phone]

## 2021-10-02 ENCOUNTER — Telehealth: Payer: Self-pay | Admitting: Neurology

## 2021-10-02 ENCOUNTER — Ambulatory Visit: Payer: No Typology Code available for payment source | Admitting: Neurology

## 2021-10-02 ENCOUNTER — Encounter: Payer: Self-pay | Admitting: Neurology

## 2021-10-02 VITALS — BP 125/82 | HR 89 | Ht 62.0 in | Wt 137.0 lb

## 2021-10-02 DIAGNOSIS — R4020 Unspecified coma: Secondary | ICD-10-CM | POA: Diagnosis not present

## 2021-10-02 DIAGNOSIS — G43719 Chronic migraine without aura, intractable, without status migrainosus: Secondary | ICD-10-CM | POA: Diagnosis not present

## 2021-10-02 MED ORDER — TIZANIDINE HCL 2 MG PO TABS: 2.0000 mg | ORAL_TABLET | Freq: Three times a day (TID) | ORAL | 3 refills | Status: DC | PRN

## 2021-10-02 MED ORDER — ONDANSETRON HCL 4 MG PO TABS: 4.0000 mg | ORAL_TABLET | ORAL | 3 refills | Status: DC | PRN

## 2021-10-02 MED ORDER — RIZATRIPTAN BENZOATE 10 MG PO TBDP: 10.0000 mg | ORAL_TABLET | ORAL | 6 refills | Status: DC | PRN

## 2021-10-02 NOTE — Telephone Encounter (Signed)
I completed a [employer] new start form for Botox. Faxed to insurance.

## 2021-10-02 NOTE — Progress Notes (Signed)
Chart reviewed, agree above plan  It is okay to proceed with Botox injection, she had suboptimal response to CGRP antagonist.

## 2021-10-02 NOTE — Patient Instructions (Signed)
Will work on seeing if your insurance will cover Botox therapy  Will update  For now continue current medications

## 2021-10-03 NOTE — Progress Notes (Signed)
Carelink Summary Report / Loop Recorder 

## 2021-10-06 NOTE — Telephone Encounter (Signed)
Received approval from [employer] . Reference #[id number] 10/02/2021-03/31/2022. I will reach out to the patient to get her scheduled for her initial Botox appointment.

## 2021-10-25 LAB — CUP PACEART REMOTE DEVICE CHECK
Date Time Interrogation Session: 20230217230207
Implantable Pulse Generator Implant Date: 20210823

## 2021-10-27 ENCOUNTER — Ambulatory Visit (INDEPENDENT_AMBULATORY_CARE_PROVIDER_SITE_OTHER): Payer: No Typology Code available for payment source

## 2021-10-27 DIAGNOSIS — R55 Syncope and collapse: Secondary | ICD-10-CM

## 2021-11-03 NOTE — Progress Notes (Signed)
Carelink Summary Report / Loop Recorder 

## 2021-12-01 ENCOUNTER — Ambulatory Visit (INDEPENDENT_AMBULATORY_CARE_PROVIDER_SITE_OTHER): Payer: No Typology Code available for payment source

## 2021-12-01 DIAGNOSIS — R55 Syncope and collapse: Secondary | ICD-10-CM | POA: Diagnosis not present

## 2021-12-03 LAB — CUP PACEART REMOTE DEVICE CHECK
Date Time Interrogation Session: 20230328143330
Implantable Pulse Generator Implant Date: 20210823

## 2021-12-12 NOTE — Progress Notes (Signed)
Carelink Summary Report / Loop Recorder 

## 2022-01-05 ENCOUNTER — Ambulatory Visit (INDEPENDENT_AMBULATORY_CARE_PROVIDER_SITE_OTHER): Payer: No Typology Code available for payment source

## 2022-01-05 DIAGNOSIS — R55 Syncope and collapse: Secondary | ICD-10-CM | POA: Diagnosis not present

## 2022-01-05 LAB — CUP PACEART REMOTE DEVICE CHECK
Date Time Interrogation Session: 20230428230532
Implantable Pulse Generator Implant Date: 20210823

## 2022-01-19 NOTE — Progress Notes (Signed)
Carelink Summary Report / Loop Recorder 

## 2022-02-09 ENCOUNTER — Ambulatory Visit (INDEPENDENT_AMBULATORY_CARE_PROVIDER_SITE_OTHER): Payer: No Typology Code available for payment source

## 2022-02-09 DIAGNOSIS — R55 Syncope and collapse: Secondary | ICD-10-CM | POA: Diagnosis not present

## 2022-02-09 LAB — CUP PACEART REMOTE DEVICE CHECK
Date Time Interrogation Session: 20230531230452
Implantable Pulse Generator Implant Date: 20210823

## 2022-02-26 NOTE — Progress Notes (Signed)
Carelink Summary Report / Loop Recorder 

## 2022-03-06 NOTE — Telephone Encounter (Signed)
Completed [employer] PA form, placed in Nurse Pod for MD signature. 

## 2022-03-11 NOTE — Telephone Encounter (Signed)
Faxed signed PA form with OV notes to [employer].  

## 2022-03-12 ENCOUNTER — Other Ambulatory Visit: Payer: Self-pay

## 2022-03-12 ENCOUNTER — Emergency Department (HOSPITAL_COMMUNITY): Payer: No Typology Code available for payment source

## 2022-03-12 ENCOUNTER — Encounter (HOSPITAL_COMMUNITY): Payer: Self-pay

## 2022-03-12 ENCOUNTER — Inpatient Hospital Stay (HOSPITAL_COMMUNITY)
Admission: EM | Admit: 2022-03-12 | Discharge: 2022-03-15 | DRG: 159 | Disposition: A | Discharge status: Home / Self Care | Payer: No Typology Code available for payment source | Source: Ambulatory Visit | Attending: Student | Admitting: Student

## 2022-03-12 DIAGNOSIS — E039 Hypothyroidism, unspecified: Secondary | ICD-10-CM | POA: Diagnosis present

## 2022-03-12 DIAGNOSIS — G43909 Migraine, unspecified, not intractable, without status migrainosus: Secondary | ICD-10-CM | POA: Diagnosis present

## 2022-03-12 DIAGNOSIS — M545 Low back pain, unspecified: Secondary | ICD-10-CM | POA: Diagnosis present

## 2022-03-12 DIAGNOSIS — K047 Periapical abscess without sinus: Secondary | ICD-10-CM | POA: Diagnosis present

## 2022-03-12 DIAGNOSIS — Z800 Family history of malignant neoplasm of digestive organs: Secondary | ICD-10-CM | POA: Diagnosis not present

## 2022-03-12 DIAGNOSIS — J45909 Unspecified asthma, uncomplicated: Secondary | ICD-10-CM | POA: Diagnosis present

## 2022-03-12 DIAGNOSIS — F32A Depression, unspecified: Secondary | ICD-10-CM

## 2022-03-12 DIAGNOSIS — G894 Chronic pain syndrome: Secondary | ICD-10-CM | POA: Diagnosis present

## 2022-03-12 DIAGNOSIS — Z981 Arthrodesis status: Secondary | ICD-10-CM

## 2022-03-12 DIAGNOSIS — E7800 Pure hypercholesterolemia, unspecified: Secondary | ICD-10-CM | POA: Diagnosis present

## 2022-03-12 DIAGNOSIS — Z885 Allergy status to narcotic agent status: Secondary | ICD-10-CM

## 2022-03-12 DIAGNOSIS — I160 Hypertensive urgency: Secondary | ICD-10-CM | POA: Diagnosis present

## 2022-03-12 DIAGNOSIS — R0789 Other chest pain: Secondary | ICD-10-CM | POA: Diagnosis present

## 2022-03-12 DIAGNOSIS — K122 Cellulitis and abscess of mouth: Secondary | ICD-10-CM | POA: Diagnosis present

## 2022-03-12 DIAGNOSIS — Z79899 Other long term (current) drug therapy: Secondary | ICD-10-CM

## 2022-03-12 DIAGNOSIS — F419 Anxiety disorder, unspecified: Secondary | ICD-10-CM | POA: Diagnosis present

## 2022-03-12 DIAGNOSIS — D649 Anemia, unspecified: Secondary | ICD-10-CM

## 2022-03-12 DIAGNOSIS — Z801 Family history of malignant neoplasm of trachea, bronchus and lung: Secondary | ICD-10-CM

## 2022-03-12 DIAGNOSIS — G8929 Other chronic pain: Secondary | ICD-10-CM | POA: Diagnosis present

## 2022-03-12 DIAGNOSIS — I7121 Aneurysm of the ascending aorta, without rupture: Secondary | ICD-10-CM | POA: Diagnosis present

## 2022-03-12 DIAGNOSIS — Z888 Allergy status to other drugs, medicaments and biological substances status: Secondary | ICD-10-CM

## 2022-03-12 DIAGNOSIS — N1831 Chronic kidney disease, stage 3a: Secondary | ICD-10-CM | POA: Diagnosis present

## 2022-03-12 DIAGNOSIS — Z79890 Hormone replacement therapy (postmenopausal): Secondary | ICD-10-CM | POA: Diagnosis not present

## 2022-03-12 DIAGNOSIS — I712 Thoracic aortic aneurysm, without rupture, unspecified: Secondary | ICD-10-CM

## 2022-03-12 DIAGNOSIS — Z96642 Presence of left artificial hip joint: Secondary | ICD-10-CM | POA: Diagnosis present

## 2022-03-12 DIAGNOSIS — K029 Dental caries, unspecified: Secondary | ICD-10-CM | POA: Diagnosis present

## 2022-03-12 LAB — CBC WITH DIFFERENTIAL/PLATELET
Abs Immature Granulocytes: 0.03 10*3/uL (ref 0.00–0.07)
Basophils Absolute: 0 10*3/uL (ref 0.0–0.1)
Basophils Relative: 0 %
Eosinophils Absolute: 0.1 10*3/uL (ref 0.0–0.5)
Eosinophils Relative: 1 %
HCT: 34.6 % — ABNORMAL LOW (ref 36.0–46.0)
Hemoglobin: 11.5 g/dL — ABNORMAL LOW (ref 12.0–15.0)
Immature Granulocytes: 0 %
Lymphocytes Relative: 19 %
Lymphs Abs: 1.4 10*3/uL (ref 0.7–4.0)
MCH: 31.5 pg (ref 26.0–34.0)
MCHC: 33.2 g/dL (ref 30.0–36.0)
MCV: 94.8 fL (ref 80.0–100.0)
Monocytes Absolute: 0.5 10*3/uL (ref 0.1–1.0)
Monocytes Relative: 7 %
Neutro Abs: 5.2 10*3/uL (ref 1.7–7.7)
Neutrophils Relative %: 73 %
Platelets: 170 10*3/uL (ref 150–400)
RBC: 3.65 MIL/uL — ABNORMAL LOW (ref 3.87–5.11)
RDW: 12.9 % (ref 11.5–15.5)
WBC: 7.3 10*3/uL (ref 4.0–10.5)
nRBC: 0 % (ref 0.0–0.2)

## 2022-03-12 LAB — BASIC METABOLIC PANEL
Anion gap: 8 (ref 5–15)
BUN: 13 mg/dL (ref 8–23)
CO2: 23 mmol/L (ref 22–32)
Calcium: 9.5 mg/dL (ref 8.9–10.3)
Chloride: 111 mmol/L (ref 98–111)
Creatinine, Ser: 1.19 mg/dL — ABNORMAL HIGH (ref 0.44–1.00)
GFR, Estimated: 52 mL/min — ABNORMAL LOW (ref >60)
Glucose, Bld: 106 mg/dL — ABNORMAL HIGH (ref 70–99)
Potassium: 4.5 mmol/L (ref 3.5–5.1)
Sodium: 142 mmol/L (ref 135–145)

## 2022-03-12 MED ORDER — BUTALBITAL-APAP-CAFFEINE 50-325-40 MG PO TABS
1.0000 | ORAL_TABLET | Freq: Every day | ORAL | Status: DC | PRN
Start: 2022-03-12 — End: 2022-03-15
  Administered 2022-03-12 – 2022-03-14 (×2): 1 via ORAL
  Filled 2022-03-12 (×2): qty 1

## 2022-03-12 MED ORDER — SODIUM CHLORIDE 0.9 % IV SOLN: INTRAVENOUS | Status: DC

## 2022-03-12 MED ORDER — ONDANSETRON HCL 4 MG PO TABS: 4.0000 mg | ORAL_TABLET | ORAL | Status: DC | PRN

## 2022-03-12 MED ORDER — ACETAMINOPHEN 325 MG PO TABS
650.0000 mg | ORAL_TABLET | Freq: Four times a day (QID) | ORAL | Status: DC | PRN
  Administered 2022-03-13 – 2022-03-14 (×2): 650 mg via ORAL
  Filled 2022-03-12 (×3): qty 2

## 2022-03-12 MED ORDER — MAGNESIUM HYDROXIDE 400 MG/5ML PO SUSP: 30.0000 mL | Freq: Every day | ORAL | Status: DC | PRN

## 2022-03-12 MED ORDER — METHYLPREDNISOLONE SODIUM SUCC 125 MG IJ SOLR
125.0000 mg | Freq: Once | INTRAMUSCULAR | Status: AC
  Administered 2022-03-12: 125 mg via INTRAVENOUS
  Filled 2022-03-12: qty 2

## 2022-03-12 MED ORDER — ACETAMINOPHEN 650 MG RE SUPP: 650.0000 mg | Freq: Four times a day (QID) | RECTAL | Status: DC | PRN

## 2022-03-12 MED ORDER — VANCOMYCIN HCL IN DEXTROSE 1-5 GM/200ML-% IV SOLN: 1000.0000 mg | Freq: Once | INTRAVENOUS | Status: DC

## 2022-03-12 MED ORDER — VANCOMYCIN HCL 1250 MG/250ML IV SOLN
1250.0000 mg | Freq: Once | INTRAVENOUS | Status: AC
  Administered 2022-03-12: 1250 mg via INTRAVENOUS
  Filled 2022-03-12: qty 250

## 2022-03-12 MED ORDER — BUPROPION HCL ER (XL) 150 MG PO TB24
300.0000 mg | ORAL_TABLET | Freq: Every day | ORAL | Status: DC
  Administered 2022-03-13 – 2022-03-15 (×3): 300 mg via ORAL
  Filled 2022-03-12 (×3): qty 2

## 2022-03-12 MED ORDER — ONDANSETRON HCL 4 MG/2ML IJ SOLN
4.0000 mg | Freq: Four times a day (QID) | INTRAMUSCULAR | Status: DC | PRN
  Administered 2022-03-13: 4 mg via INTRAVENOUS
  Filled 2022-03-12: qty 2

## 2022-03-12 MED ORDER — IOHEXOL 300 MG/ML  SOLN
75.0000 mL | Freq: Once | INTRAMUSCULAR | Status: AC | PRN
  Administered 2022-03-12: 75 mL via INTRAVENOUS

## 2022-03-12 MED ORDER — SODIUM CHLORIDE 0.9 % IV SOLN
3.0000 g | Freq: Once | INTRAVENOUS | Status: AC
  Administered 2022-03-12: 3 g via INTRAVENOUS
  Filled 2022-03-12: qty 8

## 2022-03-12 MED ORDER — TAPENTADOL HCL ER 50 MG PO TB12
50.0000 mg | ORAL_TABLET | Freq: Two times a day (BID) | ORAL | Status: DC
  Administered 2022-03-12 – 2022-03-15 (×6): 50 mg via ORAL
  Filled 2022-03-12 (×6): qty 1

## 2022-03-12 MED ORDER — SODIUM CHLORIDE 0.9 % IV SOLN: 3.0000 g | Freq: Four times a day (QID) | INTRAVENOUS | Status: DC

## 2022-03-12 MED ORDER — ORAL CARE MOUTH RINSE: 15.0000 mL | OROMUCOSAL | Status: DC | PRN

## 2022-03-12 MED ORDER — SODIUM CHLORIDE 0.9 % IV BOLUS
500.0000 mL | Freq: Once | INTRAVENOUS | Status: AC
  Administered 2022-03-12: 500 mL via INTRAVENOUS

## 2022-03-12 MED ORDER — TRIAMCINOLONE ACETONIDE 55 MCG/ACT NA AERO: 2.0000 | INHALATION_SPRAY | Freq: Every day | NASAL | Status: DC | PRN

## 2022-03-12 MED ORDER — SERTRALINE HCL 50 MG PO TABS
100.0000 mg | ORAL_TABLET | Freq: Every evening | ORAL | Status: DC
  Administered 2022-03-12 – 2022-03-14 (×3): 100 mg via ORAL
  Filled 2022-03-12 (×3): qty 2

## 2022-03-12 MED ORDER — LEVOTHYROXINE SODIUM 50 MCG PO TABS
50.0000 ug | ORAL_TABLET | Freq: Every day | ORAL | Status: DC
Start: 2022-03-13 — End: 2022-03-15
  Administered 2022-03-13 – 2022-03-15 (×3): 50 ug via ORAL
  Filled 2022-03-12 (×3): qty 1

## 2022-03-12 MED ORDER — LORAZEPAM 0.5 MG PO TABS: 0.2500 mg | ORAL_TABLET | Freq: Three times a day (TID) | ORAL | Status: DC | PRN

## 2022-03-12 MED ORDER — RIZATRIPTAN BENZOATE 10 MG PO TBDP: 10.0000 mg | ORAL_TABLET | ORAL | Status: DC | PRN

## 2022-03-12 MED ORDER — ALBUTEROL SULFATE (2.5 MG/3ML) 0.083% IN NEBU
2.5000 mg | INHALATION_SOLUTION | Freq: Four times a day (QID) | RESPIRATORY_TRACT | Status: DC | PRN
Start: 2022-03-12 — End: 2022-03-15

## 2022-03-12 MED ORDER — SODIUM CHLORIDE 0.9 % IV SOLN
12.5000 mg | Freq: Four times a day (QID) | INTRAVENOUS | Status: DC | PRN
  Administered 2022-03-12: 12.5 mg via INTRAVENOUS
  Filled 2022-03-12: qty 12.5

## 2022-03-12 MED ORDER — ENOXAPARIN SODIUM 40 MG/0.4ML IJ SOSY
40.0000 mg | PREFILLED_SYRINGE | INTRAMUSCULAR | Status: DC
  Administered 2022-03-12 – 2022-03-14 (×3): 40 mg via SUBCUTANEOUS
  Filled 2022-03-12 (×3): qty 0.4

## 2022-03-12 MED ORDER — METHYLPREDNISOLONE SODIUM SUCC 40 MG IJ SOLR
40.0000 mg | Freq: Two times a day (BID) | INTRAMUSCULAR | Status: DC
  Administered 2022-03-13 – 2022-03-15 (×5): 40 mg via INTRAVENOUS
  Filled 2022-03-12 (×5): qty 1

## 2022-03-12 MED ORDER — SODIUM CHLORIDE 0.9 % IV SOLN: 1.0000 g | Freq: Once | INTRAVENOUS | Status: DC

## 2022-03-12 MED ORDER — TRAZODONE HCL 50 MG PO TABS
25.0000 mg | ORAL_TABLET | Freq: Every evening | ORAL | Status: DC | PRN
  Filled 2022-03-12: qty 1

## 2022-03-12 MED ORDER — VANCOMYCIN HCL 750 MG/150ML IV SOLN
750.0000 mg | INTRAVENOUS | Status: DC
  Administered 2022-03-13: 750 mg via INTRAVENOUS
  Filled 2022-03-12: qty 150

## 2022-03-12 MED ORDER — ROSUVASTATIN CALCIUM 5 MG PO TABS
5.0000 mg | ORAL_TABLET | Freq: Every day | ORAL | Status: DC
  Administered 2022-03-12 – 2022-03-14 (×3): 5 mg via ORAL
  Filled 2022-03-12 (×3): qty 1

## 2022-03-12 MED ORDER — TAPENTADOL HCL ER 50 MG PO TB12: 50.0000 mg | ORAL_TABLET | Freq: Every day | ORAL | Status: DC | PRN

## 2022-03-12 MED ORDER — ONDANSETRON HCL 4 MG PO TABS: 4.0000 mg | ORAL_TABLET | Freq: Four times a day (QID) | ORAL | Status: DC | PRN

## 2022-03-12 MED ORDER — STERILE WATER FOR INJECTION IJ SOLN
INTRAMUSCULAR | Status: AC
  Administered 2022-03-12: 2 mL
  Filled 2022-03-12: qty 10

## 2022-03-12 MED ORDER — SUMATRIPTAN SUCCINATE 50 MG PO TABS
100.0000 mg | ORAL_TABLET | ORAL | Status: DC | PRN
  Administered 2022-03-13 – 2022-03-14 (×4): 100 mg via ORAL
  Filled 2022-03-12 (×6): qty 2

## 2022-03-12 MED ORDER — TIZANIDINE HCL 4 MG PO TABS
2.0000 mg | ORAL_TABLET | Freq: Three times a day (TID) | ORAL | Status: DC | PRN
Start: 2022-03-12 — End: 2022-03-15
  Administered 2022-03-13 – 2022-03-15 (×2): 2 mg via ORAL
  Filled 2022-03-12 (×2): qty 1

## 2022-03-12 MED ORDER — SODIUM CHLORIDE 0.9 % IV SOLN
3.0000 g | Freq: Four times a day (QID) | INTRAVENOUS | Status: DC
  Administered 2022-03-12 – 2022-03-15 (×9): 3 g via INTRAVENOUS
  Filled 2022-03-12 (×11): qty 8

## 2022-03-12 NOTE — H&P (Addendum)
[place]   PATIENT NAME: Nicole Morris    MR#:  [id number]  DATE OF BIRTH:  [birth year]  DATE OF ADMISSION:  03/12/2022  PRIMARY CARE PHYSICIAN: Harlan Stains, MD   Patient is coming from: Home  REQUESTING/REFERRING PHYSICIAN: Malvin Johns, MD   CHIEF COMPLAINT:   Chief Complaint  Patient presents with   Abscess    HISTORY OF PRESENT ILLNESS:  Nicole Morris is a 61 y.o. Caucasian female with medical history significant for anxiety, asthma, dyslipidemia, hypothyroidism and chronic low back pain, who presented to the emergency room with acute onset of right sided facial and submandibular swelling with associated warmth, tenderness and pain with associated tooth ache.  No fever or chills.  No nausea or vomiting or abdominal pain.  She had migraine headache earlier before her pain started around 1:30 PM. She was seen in the ER for migraine on Tuesday and was given Reglan that she did not tolerate with seizure-like activity for which she was given Benadryl.  No dysuria, oliguria or hematuria or flank pain.  No chest pain or palpitations.  No cough or wheezing or dyspnea.  ED Course: When the patient came to the ER, BP was 152/104 with otherwise normal vital signs.  Labs revealed creatinine of 1.19 with otherwise normal BMP.  CBC showed hemoglobin of 11.5 hematocrit 34.6.  Blood cultures were drawn.   Imaging: CT soft tissue neck with contrast revealed the following: 1. Soft tissue swelling in the right submandibular region and floor of mouth suggesting cellulitis, possibly dental infection arising from the lone remaining right mandibular molar tooth. No abscess. 2. 4.2 cm ascending aortic aneurysm. Recommend annual imaging followup by CTA or MRA.  The patient was given IV Unasyn and IV Solu-Medrol.  She will be admitted to a stepdown unit bed for close monitoring and management.   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   PONV (postoperative nausea and vomiting)    Scoliosis     PAST SURGICAL HISTORY:   Past Surgical History:  Procedure Laterality Date   [place]     JOINT REPLACEMENT     KNEE ARTHROSCOPY Right    SPINAL FUSION  2008   "w/harrington rods, etc"   TOTAL HIP ARTHROPLASTY Left 06/14/2018   TOTAL HIP ARTHROPLASTY Left 06/14/2018   Procedure: LEFT TOTAL HIP ARTHROPLASTY ANTERIOR APPROACH;  Surgeon: Mcarthur Rossetti, MD;  Location: [facility];  Service: Orthopedics;  Laterality: Left;    SOCIAL HISTORY:   Social History   Tobacco Use   Smoking status: Never   Smokeless tobacco: Never  Substance Use Topics   Alcohol use: Yes    Comment: rare    FAMILY HISTORY:   Family History  Problem Relation Age of Onset   Lung cancer Mother    Liver  cancer Father     DRUG ALLERGIES:   Allergies  Allergen Reactions   Atorvastatin     Other reaction(s): joint pain   Meperidine    Other Other (See Comments)    Darvocet N "doesn't like the way it makes her feel"   Codeine Nausea Only   Metoclopramide     Akathisia   Mushroom Extract Complex Nausea Only    Nausea     REVIEW OF SYSTEMS:   ROS As per history of present illness. All pertinent systems were reviewed above. Constitutional, HEENT, cardiovascular, respiratory, GI, GU, musculoskeletal, neuro, psychiatric, endocrine, integumentary and hematologic systems were reviewed and are otherwise negative/unremarkable except for positive  findings mentioned above in the HPI.   MEDICATIONS AT HOME:   Prior to Admission medications   Medication Sig Start Date End Date Taking? Authorizing Provider  albuterol (PROVENTIL HFA;VENTOLIN HFA) 108 (90 BASE) MCG/ACT inhaler Inhale 2 puffs into the lungs every 6 (six) hours as needed for wheezing or shortness of breath.    [provider]  buPROPion (WELLBUTRIN XL) 300 MG 24 hr tablet Take 300 mg by mouth daily.  11/30/17   [provider]  butalbital-acetaminophen-caffeine (FIORICET, ESGIC) [id number] MG tablet Take 1 tablet by mouth daily as needed for headache. 03/25/18   [provider]  diclofenac sodium (VOLTAREN) 1 % GEL Apply 2 g topically daily as needed (joint pain).  01/21/18   [provider]  levothyroxine (SYNTHROID, LEVOTHROID) 50 MCG tablet Take 50 mcg by mouth daily before breakfast. 03/03/18   [provider]  LORazepam (ATIVAN) 0.5 MG tablet Take 0.25-0.5 mg by mouth every 8 (eight) hours as needed for anxiety.    [provider]  ondansetron (ZOFRAN) 4 MG tablet Take 1 tablet (4 mg total) by mouth as needed for nausea or vomiting. 10/02/21   Suzzanne Cloud, NP  rizatriptan (MAXALT-MLT) 10 MG disintegrating tablet Take 1 tablet (10 mg total) by mouth as needed for migraine. May repeat in 2 hours if needed 10/02/21   Suzzanne Cloud, NP  rosuvastatin (CRESTOR) 5 MG tablet Take 5 mg by mouth daily.  02/18/18   [provider]  sertraline (ZOLOFT) 100 MG tablet Take 100 mg by mouth every evening.     [provider]  tapentadol (NUCYNTA) 50 MG 12 hr tablet Take 50 mg by mouth daily as needed (pain).     [provider]  tiZANidine (ZANAFLEX) 2 MG tablet Take 1 tablet (2 mg total) by mouth every 8 (eight) hours as needed for muscle spasms. 10/02/21   Suzzanne Cloud, NP  triamcinolone (NASACORT) 55 MCG/ACT AERO nasal inhaler 2 sprays in each nostril 11/16/16   [provider]      VITAL SIGNS:   Blood pressure (!) 166/104, pulse 69, temperature 98.6 F (37 C), temperature source Oral, resp. rate 16, height 5' 2.5" (1.588 m), weight 59.9 kg, SpO2 97 %.  PHYSICAL EXAMINATION:  Physical Exam  GENERAL:  61 y.o.-year-old Caucasian female patient lying in the bed with no acute distress.  EYES: Pupils equal, round, reactive to light and accommodation. No scleral icterus. Extraocular muscles intact.  HEENT: Head atraumatic, normocephalic. Oropharynx with poor dentition and right submandibular swelling with associated warmth, tenderness and induration without fluctuation and nasopharynx clear.  NECK:  Supple, no jugular venous distention. No thyroid enlargement, no tenderness.  LUNGS: Normal breath sounds bilaterally, no wheezing, rales,rhonchi or crepitation. No use of accessory muscles of respiration.  CARDIOVASCULAR: Regular rate  and rhythm, S1, S2 normal. No murmurs, rubs, or gallops.  ABDOMEN: Soft, nondistended, nontender. Bowel sounds present. No organomegaly or mass.  EXTREMITIES: No pedal edema, cyanosis, or clubbing.  NEUROLOGIC: Cranial nerves II through XII are intact. Muscle strength 5/5 in all extremities. Sensation intact. Gait not checked.  PSYCHIATRIC: The patient is alert and oriented x 3.  Normal affect and good eye contact. SKIN: No obvious rash, lesion, or ulcer.   LABORATORY PANEL:   CBC Recent Labs  Lab 03/13/22 0335  WBC 4.7  HGB 10.6*  HCT 33.1*  PLT 170   ------------------------------------------------------------------------------------------------------------------  Chemistries  Recent Labs  Lab 03/13/22 0335  NA 142  K 4.3  CL 114*  CO2 20*  GLUCOSE 136*  BUN 14  CREATININE 1.15*  CALCIUM 8.6*   ------------------------------------------------------------------------------------------------------------------  Cardiac Enzymes No results for input(s): "TROPONINI" in the last 168  hours. ------------------------------------------------------------------------------------------------------------------  RADIOLOGY:  CT Soft Tissue Neck W Contrast  Result Date: 03/12/2022 CLINICAL DATA:  Right lower facial soft tissue swelling. EXAM: CT NECK WITH CONTRAST TECHNIQUE: Multidetector CT imaging of the neck was performed using the standard protocol following the bolus administration of intravenous contrast. RADIATION DOSE REDUCTION: This exam was performed according to the departmental dose-optimization program which includes automated exposure control, adjustment of the mA and/or kV according to patient size and/or use of iterative reconstruction technique. CONTRAST:  53mL OMNIPAQUE IOHEXOL 300 MG/ML  SOLN COMPARISON:  Chest CT 01/15/2020. FINDINGS: Pharynx and larynx: Mild motion artifact. No mass. Widely patent airway. No retropharyngeal fluid collection. Salivary glands: Soft tissue swelling and inflammatory changes in the right submandibular space and right floor of mouth with some extension across the midline in the submental region. No organized fluid collection. Normal appearance of the submandibular glands themselves as well as of the parotid glands. No salivary stone or mass. Thyroid: Unremarkable. Lymph nodes: Asymmetrically enlarged right level IB and II lymph nodes which remain subcentimeter in short axis and likely reactive. Vascular: Major vascular structures of the neck are grossly patent. Partially visualized aneurysmal dilatation of the ascending aorta with 4.2 cm maximal diameter, unchanged from the prior chest CT. Limited intracranial: Unremarkable. Visualized orbits: Not imaged. Mastoids and visualized paranasal sinuses: Clear. Skeleton: Multiple dental caries. Periapical lucencies most notably involving the left-sided mandibular canines with disruption of the overlying buccal cortex of the mandible. The lone remaining right mandibular molar tooth contains a cavity and  demonstrates mild periapical lucency with disruption of the lingual cortex of the mandible, and the above described soft tissue inflammation may be originating from this location. Upper chest: Clear lung apices. Other: None. IMPRESSION: 1. Soft tissue swelling in the right submandibular region and floor of mouth suggesting cellulitis, possibly dental infection arising from the lone remaining right mandibular molar tooth. No abscess. 2. 4.2 cm ascending aortic aneurysm. Recommend annual imaging followup by CTA or MRA. This recommendation follows 2010 ACCF/AHA/AATS/ACR/ASA/SCA/SCAI/SIR/STS/SVM Guidelines for the Diagnosis and Management of Patients with Thoracic Aortic Disease. Circulation. 2010; 121: W580-D983. Aortic aneurysm NOS (ICD10-I71.9) Electronically Signed   By: Sebastian Ache M.D.   On: 03/12/2022 18:21      IMPRESSION AND PLAN:  Assessment and Plan: * Ludwig's angina - The patient will be admitted to a stepdown unit bed. - We will monitor airway closely. - We will continue antibiotic therapy with IV Unasyn and vancomycin. - Pain management will be provided. - ENT consult will be obtained. - Dr. Jenne Pane was notified about the patient and is aware. - The patient may  need oral surgery consult as well that can be called in AM.  Hypertensive urgency - Likely due to pain. - P.R.N. Hydralazine and will monitor BP.  Thoracic aortic aneurysm [facility]) - This will need follow-up as mentioned above.  Chronic pain syndrome - We will continue Nucynta.  Hypothyroidism - We will continue Synthroid.  Migraine - We will continue Fioricet and Maxalt.  Anxiety and depression - We will continue Ativan and Wellbutrin XL.    DVT prophylaxis: Lovenox.  Advanced Care Planning:  Code Status: full code.  Family Communication:  The plan of care was discussed in details with the patient (and family). I answered all questions. The patient agreed to proceed with the above mentioned plan. Further  management will depend upon hospital course. Disposition Plan: Back to previous home environment Consults called: ENT. All the records are reviewed and case discussed with ED provider.  Status is: Inpatient    At the time of the admission, it appears that the appropriate admission status for this patient is inpatient.  This is judged to be reasonable and necessary in order to provide the required intensity of service to ensure the patient's safety given the presenting symptoms, physical exam findings and initial radiographic and laboratory data in the context of comorbid conditions.  The patient requires inpatient status due to high intensity of service, high risk of further deterioration and high frequency of surveillance required.  I certify that at the time of admission, it is my clinical judgment that the patient will require inpatient hospital care extending more than 2 midnights.                            Dispo: The patient is from: Home              Anticipated d/c is to: Home              Patient currently is not medically stable to d/c.              Difficult to place patient: No  Hannah Beat M.D on 03/13/2022 at 8:04 PM  [facility]   From 7 PM-7 AM, contact night-coverage [email or web address]  CC: Primary care physician; Laurann Montana, MD

## 2022-03-12 NOTE — ED Provider Notes (Signed)
[facility]-EMERGENCY DEPT Provider Note   CSN: [id number] Arrival date & time: 03/12/22  1602     History  Chief Complaint  Patient presents with   Abscess    Nicole Morris is a 61 y.o. female.  Patient is a 61 year old female who presents with a knot under her jaw.  She states that she just noticed it about 2:00 this afternoon.  It is tender.  She has some pain in her teeth on that side as well.  No known fevers.  No nausea or vomiting.  She does say it hurts a little bit to swallow and she is starting to drool a little bit more than she was earlier.  She denies any shortness of breath.  She can swallow her secretions but it is a little more difficult than it was when she first came in.  She also has a migraine type headache.'s on the right side of her head.  It similar to her prior migrainous type headaches.'s been going on for several days.  [place] the beach when this [place] and had to go to the emergency department 2 days ago and got a shot of Toradol.  She says it resolved it for a day and then it came back.  She went to see her pain specialist this afternoon to get an injection for her migraine but he was concerned about the knot around her jaw and sent her here for further evaluation without giving her the injection.  She denies any rash or itching.       Home Medications Prior to Admission medications   Medication Sig Start Date End Date Taking? Authorizing Provider  albuterol (PROVENTIL HFA;VENTOLIN HFA) 108 (90 BASE) MCG/ACT inhaler Inhale 2 puffs into the lungs every 6 (six) hours as needed for wheezing or shortness of breath.    [provider]  buPROPion (WELLBUTRIN XL) 300 MG 24 hr tablet Take 300 mg by mouth daily.  11/30/17   [provider]  butalbital-acetaminophen-caffeine (FIORICET, ESGIC) 78-295-62 MG tablet Take 1 tablet by mouth daily as needed for headache. 03/25/18   [provider]  diclofenac sodium  (VOLTAREN) 1 % GEL Apply 2 g topically daily as needed (joint pain).  01/21/18   [provider]  levothyroxine (SYNTHROID, LEVOTHROID) 50 MCG tablet Take 50 mcg by mouth daily before breakfast. 03/03/18   [provider]  LORazepam (ATIVAN) 0.5 MG tablet Take 0.25-0.5 mg by mouth every 8 (eight) hours as needed for anxiety.    [provider]  ondansetron (ZOFRAN) 4 MG tablet Take 1 tablet (4 mg total) by mouth as needed for nausea or vomiting. 10/02/21   Glean Salvo, NP  rizatriptan (MAXALT-MLT) 10 MG disintegrating tablet Take 1 tablet (10 mg total) by mouth as needed for migraine. May repeat in 2 hours if needed 10/02/21   Glean Salvo, NP  rosuvastatin (CRESTOR) 5 MG tablet Take 5 mg by mouth daily.  02/18/18   [provider]  sertraline (ZOLOFT) 100 MG tablet Take 100 mg by mouth every evening.     [provider]  tapentadol (NUCYNTA) 50 MG 12 hr tablet Take 50 mg by mouth daily as needed (pain).     [provider]  tiZANidine (ZANAFLEX) 2 MG tablet Take 1 tablet (2 mg total) by mouth every 8 (eight) hours as needed for muscle spasms. 10/02/21   Glean Salvo, NP  triamcinolone (NASACORT) 55 MCG/ACT AERO nasal inhaler 2 sprays in  each nostril 11/16/16   [provider]      Allergies    Atorvastatin, Other, Codeine, and Mushroom extract complex    Review of Systems   Review of Systems  Constitutional:  Negative for chills, diaphoresis, fatigue and fever.  HENT:  Positive for dental problem, facial swelling and trouble swallowing. Negative for congestion, rhinorrhea and sneezing.   Eyes: Negative.   Respiratory:  Negative for cough, chest tightness and shortness of breath.   Cardiovascular:  Negative for chest pain and leg swelling.  Gastrointestinal:  Negative for abdominal pain, blood in stool, diarrhea, nausea and vomiting.  Genitourinary:  Negative for difficulty urinating, flank pain, frequency and hematuria.   Musculoskeletal:  Negative for arthralgias and back pain.  Skin:  Negative for rash.  Neurological:  Negative for dizziness, speech difficulty, weakness, numbness and headaches.    Physical Exam Updated Vital Signs BP (!) 154/92   Pulse 83   Temp 99.4 F (37.4 C) (Oral)   Resp 18   Ht 5\' 2"  (1.575 m)   Wt 62.1 kg   SpO2 100%   BMI 25.06 kg/m  Physical Exam Constitutional:      General: She is in acute distress.     Appearance: She is well-developed. She is ill-appearing.  HENT:     Head: Normocephalic and atraumatic.     Mouth/Throat:     Comments: Patient has some swelling to the right submandibular area.  There is tenderness to this area.  There is also tenderness along her right lower molars.  There is some swelling under her tongue on the right side.  No distinct trismus. Eyes:     Pupils: Pupils are equal, round, and reactive to light.  Cardiovascular:     Rate and Rhythm: Normal rate and regular rhythm.     Heart sounds: Normal heart sounds.  Pulmonary:     Effort: Pulmonary effort is normal. No respiratory distress.     Breath sounds: Normal breath sounds. No wheezing or rales.  Chest:     Chest wall: No tenderness.  Abdominal:     General: Bowel sounds are normal.     Palpations: Abdomen is soft.     Tenderness: There is no abdominal tenderness. There is no guarding or rebound.  Musculoskeletal:        General: Normal range of motion.     Cervical back: Normal range of motion and neck supple.  Lymphadenopathy:     Cervical: No cervical adenopathy.  Skin:    General: Skin is warm and dry.     Findings: No rash.  Neurological:     Mental Status: She is alert and oriented to person, place, and time.     ED Results / Procedures / Treatments   Labs (all labs ordered are listed, but only abnormal results are displayed) Labs Reviewed  BASIC METABOLIC PANEL - Abnormal; Notable for the following components:      Result Value   Glucose, Bld 106 (*)     Creatinine, Ser 1.19 (*)    GFR, Estimated 52 (*)    All other components within normal limits  CBC WITH DIFFERENTIAL/PLATELET - Abnormal; Notable for the following components:   RBC 3.65 (*)    Hemoglobin 11.5 (*)    HCT 34.6 (*)    All other components within normal limits  CULTURE, BLOOD (ROUTINE X 2)  CULTURE, BLOOD (ROUTINE X 2)    EKG None  Radiology CT Soft Tissue Neck W Contrast  Result Date: 03/12/2022 CLINICAL DATA:  Right lower facial soft tissue swelling. EXAM: CT NECK WITH CONTRAST TECHNIQUE: Multidetector CT imaging of the neck was performed using the standard protocol following the bolus administration of intravenous contrast. RADIATION DOSE REDUCTION: This exam was performed according to the departmental dose-optimization program which includes automated exposure control, adjustment of the mA and/or kV according to patient size and/or use of iterative reconstruction technique. CONTRAST:  19mL OMNIPAQUE IOHEXOL 300 MG/ML  SOLN COMPARISON:  Chest CT 01/15/2020. FINDINGS: Pharynx and larynx: Mild motion artifact. No mass. Widely patent airway. No retropharyngeal fluid collection. Salivary glands: Soft tissue swelling and inflammatory changes in the right submandibular space and right floor of mouth with some extension across the midline in the submental region. No organized fluid collection. Normal appearance of the submandibular glands themselves as well as of the parotid glands. No salivary stone or mass. Thyroid: Unremarkable. Lymph nodes: Asymmetrically enlarged right level IB and II lymph nodes which remain subcentimeter in short axis and likely reactive. Vascular: Major vascular structures of the neck are grossly patent. Partially visualized aneurysmal dilatation of the ascending aorta with 4.2 cm maximal diameter, unchanged from the prior chest CT. Limited intracranial: Unremarkable. Visualized orbits: Not imaged. Mastoids and visualized paranasal sinuses: Clear. Skeleton:  Multiple dental caries. Periapical lucencies most notably involving the left-sided mandibular canines with disruption of the overlying buccal cortex of the mandible. The lone remaining right mandibular molar tooth contains a cavity and demonstrates mild periapical lucency with disruption of the lingual cortex of the mandible, and the above described soft tissue inflammation may be originating from this location. Upper chest: Clear lung apices. Other: None. IMPRESSION: 1. Soft tissue swelling in the right submandibular region and floor of mouth suggesting cellulitis, possibly dental infection arising from the lone remaining right mandibular molar tooth. No abscess. 2. 4.2 cm ascending aortic aneurysm. Recommend annual imaging followup by CTA or MRA. This recommendation follows 2010 ACCF/AHA/AATS/ACR/ASA/SCA/SCAI/SIR/STS/SVM Guidelines for the Diagnosis and Management of Patients with Thoracic Aortic Disease. Circulation. 2010; 121: Q762-U633. Aortic aneurysm NOS (ICD10-I71.9) Electronically Signed   By: Sebastian Ache M.D.   On: 03/12/2022 18:21    Procedures Procedures    Medications Ordered in ED Medications  promethazine (PHENERGAN) 12.5 mg in sodium chloride 0.9 % 50 mL IVPB (12.5 mg Intravenous New Bag/Given 03/12/22 1853)  iohexol (OMNIPAQUE) 300 MG/ML solution 75 mL (75 mLs Intravenous Contrast Given 03/12/22 1743)  sodium chloride 0.9 % bolus 500 mL (500 mLs Intravenous New Bag/Given 03/12/22 1803)  methylPREDNISolone sodium succinate (SOLU-MEDROL) 125 mg/2 mL injection 125 mg (125 mg Intravenous Given 03/12/22 1806)  Ampicillin-Sulbactam (UNASYN) 3 g in sodium chloride 0.9 % 100 mL IVPB (0 g Intravenous Stopped 03/12/22 1834)  sterile water (preservative free) injection (2 mLs  Given 03/12/22 1806)    ED Course/ Medical Decision Making/ A&P                           Medical Decision Making Risk Prescription drug management. Decision regarding hospitalization.   Patient is a 61 year old who  presents with a migraine but also was noted some swelling under her right jaw and tongue since about 2 PM this afternoon.  She currently does not have any airway compromise but does have a little bit of pain on swallowing.  CT scan reveals evidence of cellulitis in the submandibular space and the soft tissues under the tongue.  This is consistent with Ludwick's angina.  There is  no drainable abscess.  We do not have an oral surgeon on-call tonight.  I spoke with Dr. Jenne Pane who is amenable to being available if the patient decompensates or has any airway compromise.  Patient was started on IV Unasyn and Solu-Medrol.  Given that there is no drainable abscess, I feel the patient can be admitted for the IV antibiotics and have oral surgery see the patient tomorrow.  I spoke with Dr. Arville Care with the hospitalist service who will admit the patient for further treatment.  She was given dose of Phenergan for her migraine and says its starting to ease off.  She normally gets Toradol which I did not want to do at this point.  Her labs are reviewed and are nonconcerning.  Her creatinine is little bit elevated but similar to prior values.  I also notified her of the incidental aortic aneurysm which will need outpatient follow-up.  CRITICAL CARE Performed by: Rolan Bucco Total critical care time: 30 minutes Critical care time was exclusive of separately billable procedures and treating other patients. Critical care was necessary to treat or prevent imminent or life-threatening deterioration. Critical care was time spent personally by me on the following activities: development of treatment plan with patient and/or surrogate as well as nursing, discussions with consultants, evaluation of patient's response to treatment, examination of patient, obtaining history from patient or surrogate, ordering and performing treatments and interventions, ordering and review of laboratory studies, ordering and review of radiographic  studies, pulse oximetry and re-evaluation of patient's condition.    Final Clinical Impression(s) / ED Diagnoses Final diagnoses:  Ludwig's angina  Aneurysm of ascending aorta without rupture [facility])    Rx / DC Orders ED Discharge Orders     None         Rolan Bucco, MD 03/12/22 1926

## 2022-03-12 NOTE — ED Notes (Signed)
ED Provider at bedside. 

## 2022-03-12 NOTE — Progress Notes (Signed)
Pharmacy Antibiotic Note  Nicole Morris is a 61 y.o. female admitted on 03/12/2022 with  Ludwig's angina .  Pharmacy has been consulted for vancomycin dosing.  Today, 03/12/22 WBC WNL SCr 1.19 slightly elevated, CrCl ~43 mL/min Afebrile  Plan: Ampicillin/sulbactam 3 g IV q6h Vancomycin 1250 mg IV LD followed by 750 mg IV q24h for estimated AUC of 453 Goal vancomycin AUC 400-550. Monitor levels once at steady state as needed Monitor renal function  Height: 5\' 2"  (157.5 cm) Weight: 62.1 kg (137 lb) IBW/kg (Calculated) : 50.1  Temp (24hrs), Avg:99.4 F (37.4 C), Min:99.4 F (37.4 C), Max:99.4 F (37.4 C)  Recent Labs  Lab 03/12/22 1643  WBC 7.3  CREATININE 1.19*    Estimated Creatinine Clearance: 43 mL/min (A) (by C-G formula based on SCr of 1.19 mg/dL (H)).    Allergies  Allergen Reactions   Atorvastatin     Other reaction(s): joint pain   Other Other (See Comments)    Darvocet N "doesn't like the way it makes her feel"   Codeine Nausea Only   Mushroom Extract Complex Nausea Only    Nausea     Antimicrobials this admission: Ampicillin/sulbactam 7/6 >>  vancomycin 7/6 >>   Dose adjustments this admission:  Microbiology results: 7/6 BCx:   9/6, PharmD 03/12/2022 7:54 PM

## 2022-03-12 NOTE — ED Notes (Signed)
ED TO INPATIENT HANDOFF REPORT  ED Nurse Name and Phone #:   S Name/Age/Gender Nicole Morris 61 y.o. female Room/Bed: WA03/WA03  Code Status   Code Status: Full Code  Home/SNF/Other Home Patient oriented to: self, place, time, and situation Is this baseline? 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 Pt states that when driving home today she began noticing R lower dental swelling now crossing midline. Pt denies trouble swallowing, SOB, or fevers.    Allergies Allergies  Allergen Reactions   Atorvastatin     Other reaction(s): joint pain   Other Other (See Comments)    Darvocet N "doesn't like the way it makes her feel"   Codeine Nausea Only   Mushroom Extract Complex Nausea Only    Nausea     Level of Care/Admitting Diagnosis ED Disposition     ED Disposition  Admit   Condition  --   Comment  Hospital Area: [facility] [[id number]]  Level of Care: Stepdown [14]  Admit to SDU based on following criteria: Respiratory Distress:  Frequent assessment and/or intervention to maintain adequate ventilation/respiration, pulmonary toilet, and respiratory treatment.  May admit patient to [facility] or [facility] if equivalent level of care is available:: No  Covid Evaluation: Asymptomatic - no recent exposure (last 10 days) testing not required  Diagnosis: Ludwig's angina [[id number]]  Admitting Physician: Hannah Beat [[id number]]  Attending Physician: Hannah Beat [[id number]]  Certification:: I certify this patient will need inpatient services for at least 2 midnights  Estimated Length of Stay: 2          B Medical/Surgery History Past Medical History:  Diagnosis Date   Anxiety    Asthma    Chronic lower back pain    Dysrhythmia    pt got Tachy last time under anesthesia   High cholesterol    History of blood  transfusion    "related to surgeries" (06/14/2018)   Hypothyroidism    Kidney disease, chronic, stage II (GFR 60-89 ml/min)    "one of my kidneys is in my abdomen" (06/14/2018)   Migraine    "daily-weekly" (06/14/2018)   Neuromuscular disorder (HCC)    neuropathy in legs from back surgery   PONV (postoperative nausea and vomiting)    Scoliosis    Past Surgical History:  Procedure Laterality Date   ABDOMINAL HYSTERECTOMY  1988   BACK SURGERY     BLADDER AUGMENTATION  1988   HERNIA REPAIR     JOINT REPLACEMENT     KNEE ARTHROSCOPY Right    SPINAL FUSION  2008   "w/harrington rods, etc"   TOTAL HIP ARTHROPLASTY Left 06/14/2018   TOTAL HIP ARTHROPLASTY Left 06/14/2018   Procedure: LEFT TOTAL HIP ARTHROPLASTY ANTERIOR APPROACH;  Surgeon: Kathryne Hitch, MD;  Location: [facility] OR;  Service: Orthopedics;  Laterality: Left;     A IV Location/Drains/Wounds Patient Lines/Drains/Airways Status     Active Line/Drains/Airways     Name Placement date Placement time Site Days   Peripheral IV 03/12/22 1" Anterior;Left;Proximal Forearm 03/12/22  1723  Forearm  less than 1   Peripheral IV 03/12/22 20 G 1" Anterior;Proximal;Right Forearm 03/12/22  1901  Forearm  less than 1            Intake/Output Last 24 hours  Intake/Output Summary (Last 24 hours) at 03/12/2022 2019  Last data filed at 03/12/2022 2002 Gross per 24 hour  Intake 647.14 ml  Output --  Net 647.14 ml    Labs/Imaging Results for orders placed or performed during the hospital encounter of 03/12/22 (from the past 48 hour(s))  Basic metabolic panel     Status: Abnormal   Collection Time: 03/12/22  4:43 PM  Result Value Ref Range   Sodium 142 135 - 145 mmol/L   Potassium 4.5 3.5 - 5.1 mmol/L   Chloride 111 98 - 111 mmol/L   CO2 23 22 - 32 mmol/L   Glucose, Bld 106 (H) 70 - 99 mg/dL    Comment: Glucose reference range applies only to samples taken after fasting for at least 8 hours.   BUN 13 8 - 23 mg/dL    Creatinine, Ser 1.19 (H) 0.44 - 1.00 mg/dL   Calcium 9.5 8.9 - 10.3 mg/dL   GFR, Estimated 52 (L) >60 mL/min    Comment: (NOTE) Calculated using the CKD-EPI Creatinine Equation (2021)    Anion gap 8 5 - 15    Comment: Performed at [facility], [place] [address], [place], [place] [address]  CBC with Differential     Status: Abnormal   Collection Time: 03/12/22  4:43 PM  Result Value Ref Range   WBC 7.3 4.0 - 10.5 K/uL   RBC 3.65 (L) 3.87 - 5.11 MIL/uL   Hemoglobin 11.5 (L) 12.0 - 15.0 g/dL   HCT 34.6 (L) 36.0 - 46.0 %   MCV 94.8 80.0 - 100.0 fL   MCH 31.5 26.0 - 34.0 pg   MCHC 33.2 30.0 - 36.0 g/dL   RDW 12.9 11.5 - 15.5 %   Platelets 170 150 - 400 K/uL   nRBC 0.0 0.0 - 0.2 %   Neutrophils Relative % 73 %   Neutro Abs 5.2 1.7 - 7.7 K/uL   Lymphocytes Relative 19 %   Lymphs Abs 1.4 0.7 - 4.0 K/uL   Monocytes Relative 7 %   Monocytes Absolute 0.5 0.1 - 1.0 K/uL   Eosinophils Relative 1 %   Eosinophils Absolute 0.1 0.0 - 0.5 K/uL   Basophils Relative 0 %   Basophils Absolute 0.0 0.0 - 0.1 K/uL   Immature Granulocytes 0 %   Abs Immature Granulocytes 0.03 0.00 - 0.07 K/uL    Comment: Performed at [facility], [place] [address], [place], [place] [address]   CT Soft Tissue Neck W Contrast  Result Date: 03/12/2022 CLINICAL DATA:  Right lower facial soft tissue swelling. EXAM: CT NECK WITH CONTRAST TECHNIQUE: Multidetector CT imaging of the neck was performed using the standard protocol following the bolus administration of intravenous contrast. RADIATION DOSE REDUCTION: This exam was performed according to the departmental dose-optimization program which includes automated exposure control, adjustment of the mA and/or kV according to patient size and/or use of iterative reconstruction technique. CONTRAST:  28mL OMNIPAQUE IOHEXOL 300 MG/ML  SOLN COMPARISON:  Chest CT 01/15/2020. FINDINGS: Pharynx and larynx: Mild motion artifact. No mass. Widely patent  airway. No retropharyngeal fluid collection. Salivary glands: Soft tissue swelling and inflammatory changes in the right submandibular space and right floor of mouth with some extension across the midline in the submental region. No organized fluid collection. Normal appearance of the submandibular glands themselves as well as of the parotid glands. No salivary stone or mass. Thyroid: Unremarkable. Lymph nodes: Asymmetrically enlarged right level IB and II lymph nodes which remain subcentimeter in short axis and likely reactive. Vascular: Major  vascular structures of the neck are grossly patent. Partially visualized aneurysmal dilatation of the ascending aorta with 4.2 cm maximal diameter, unchanged from the prior chest CT. Limited intracranial: Unremarkable. Visualized orbits: Not imaged. Mastoids and visualized paranasal sinuses: Clear. Skeleton: Multiple dental caries. Periapical lucencies most notably involving the left-sided mandibular canines with disruption of the overlying buccal cortex of the mandible. The lone remaining right mandibular molar tooth contains a cavity and demonstrates mild periapical lucency with disruption of the lingual cortex of the mandible, and the above described soft tissue inflammation may be originating from this location. Upper chest: Clear lung apices. Other: None. IMPRESSION: 1. Soft tissue swelling in the right submandibular region and floor of mouth suggesting cellulitis, possibly dental infection arising from the lone remaining right mandibular molar tooth. No abscess. 2. 4.2 cm ascending aortic aneurysm. Recommend annual imaging followup by CTA or MRA. This recommendation follows 2010 ACCF/AHA/AATS/ACR/ASA/SCA/SCAI/SIR/STS/SVM Guidelines for the Diagnosis and Management of Patients with Thoracic Aortic Disease. Circulation. 2010; [id number]. Aortic aneurysm NOS (ICD10-I71.9) Electronically Signed   By: Logan Bores M.D.   On: 03/12/2022 18:21    Pending Labs Unresulted  Labs (From admission, onward)     Start     Ordered   03/19/22 0500  Creatinine, serum  (enoxaparin (LOVENOX)    CrCl >/= 30 ml/min)  Weekly,   R     Comments: while on enoxaparin therapy    03/12/22 1923   03/13/22 [id number]  Basic metabolic panel  Tomorrow morning,   R        03/12/22 1923   03/13/22 0500  CBC  Tomorrow morning,   R        03/12/22 1923   03/12/22 1920  HIV Antibody (routine testing w rflx)  (HIV Antibody (Routine testing w reflex) panel)  Once,   R        03/12/22 1923   03/12/22 1920  CBC  (enoxaparin (LOVENOX)    CrCl >/= 30 ml/min)  Once,   R       Comments: Baseline for enoxaparin therapy IF NOT ALREADY DRAWN.  Notify MD if PLT < 100 K.    03/12/22 1923   03/12/22 1920  Creatinine, serum  (enoxaparin (LOVENOX)    CrCl >/= 30 ml/min)  Once,   R       Comments: Baseline for enoxaparin therapy IF NOT ALREADY DRAWN.    03/12/22 1923   03/12/22 1834  Culture, blood (routine x 2)  BLOOD CULTURE X 2,   R (with STAT occurrences)      03/12/22 1833            Vitals/Pain Today's Vitals   03/12/22 1915 03/12/22 1930 03/12/22 1945 03/12/22 2000  BP:  140/84  138/87  Pulse: 80 86 88 90  Resp:      Temp:      TempSrc:      SpO2: 97% 97% 95% 96%  Weight:      Height:      PainSc:        Isolation Precautions No active isolations  Medications Medications  promethazine (PHENERGAN) 12.5 mg in sodium chloride 0.9 % 50 mL IVPB (0 mg Intravenous Stopped 03/12/22 1957)  enoxaparin (LOVENOX) injection 40 mg (has no administration in time range)  0.9 %  sodium chloride infusion ( Intravenous New Bag/Given 03/12/22 2002)  acetaminophen (TYLENOL) tablet 650 mg (has no administration in time range)    Or  acetaminophen (TYLENOL) suppository 650 mg (has no  administration in time range)  traZODone (DESYREL) tablet 25 mg (has no administration in time range)  magnesium hydroxide (MILK OF MAGNESIA) suspension 30 mL (has no administration in time range)  ondansetron (ZOFRAN)  tablet 4 mg (has no administration in time range)    Or  ondansetron (ZOFRAN) injection 4 mg (has no administration in time range)  Ampicillin-Sulbactam (UNASYN) 3 g in sodium chloride 0.9 % 100 mL IVPB (has no administration in time range)  vancomycin (VANCOREADY) IVPB 1250 mg/250 mL (has no administration in time range)  butalbital-acetaminophen-caffeine (FIORICET) 50-325-40 MG per tablet 1 tablet (has no administration in time range)  rizatriptan (MAXALT-MLT) disintegrating tablet 10 mg (has no administration in time range)  tapentadol (NUCYNTA) 12 hr tablet 50 mg (has no administration in time range)  rosuvastatin (CRESTOR) tablet 5 mg (has no administration in time range)  buPROPion (WELLBUTRIN XL) 24 hr tablet 300 mg (has no administration in time range)  sertraline (ZOLOFT) tablet 100 mg (has no administration in time range)  LORazepam (ATIVAN) tablet 0.25-0.5 mg (has no administration in time range)  ondansetron (ZOFRAN) tablet 4 mg (has no administration in time range)  levothyroxine (SYNTHROID) tablet 50 mcg (has no administration in time range)  tiZANidine (ZANAFLEX) tablet 2 mg (has no administration in time range)  albuterol (PROVENTIL) (2.5 MG/3ML) 0.083% nebulizer solution 2.5 mg (has no administration in time range)  triamcinolone (NASACORT) nasal inhaler 2 spray (has no administration in time range)  vancomycin (VANCOREADY) IVPB 750 mg/150 mL (has no administration in time range)  iohexol (OMNIPAQUE) 300 MG/ML solution 75 mL (75 mLs Intravenous Contrast Given 03/12/22 1743)  sodium chloride 0.9 % bolus 500 mL (0 mLs Intravenous Stopped 03/12/22 2002)  methylPREDNISolone sodium succinate (SOLU-MEDROL) 125 mg/2 mL injection 125 mg (125 mg Intravenous Given 03/12/22 1806)  Ampicillin-Sulbactam (UNASYN) 3 g in sodium chloride 0.9 % 100 mL IVPB (0 g Intravenous Stopped 03/12/22 1834)  sterile water (preservative free) injection (2 mLs  Given 03/12/22 1806)    Mobility walks Low fall risk    Focused Assessments    R Recommendations: See Admitting Provider Note  Report given to:   Additional Notes:

## 2022-03-12 NOTE — ED Triage Notes (Signed)
Pt states that she was seen at ED at the [facility] on July 4th for a migraine. Pt states that when driving home today she began noticing R lower dental swelling now crossing midline. Pt denies trouble swallowing, SOB, or fevers.

## 2022-03-12 NOTE — Plan of Care (Signed)

## 2022-03-12 NOTE — ED Provider Triage Note (Signed)
Emergency Medicine Provider Triage Evaluation Note  Nicole Morris , a 61 y.o. female  was evaluated in triage.  Pt complains of jaw abscess.  Started acutely few hours ago and has been worsening and spreading under neck. Denies fevers.   Review of Systems  Per HPI  Physical Exam  There were no vitals taken for this visit. Gen:   Awake, no distress   Resp:  Normal effort  MSK:   Moves extremities without difficulty  Other:  Is not really any sublingual tenderness but there is submandibular swelling primarily to the right side of jaw.  Tenderness over the right side, speaking in complete sentences and not drooling.  Medical Decision Making  Medically screening exam initiated at 4:33 PM.  Appropriate orders placed.  Alfred Page-Shinn was informed that the remainder of the evaluation will be completed by another provider, this initial triage assessment does not replace that evaluation, and the importance of remaining in the ED until their evaluation is complete.  Concern for developing deeper soft tissue infection, Ludwig angina on differential.  Next room   Theron Arista, [place] 03/12/22 1636

## 2022-03-13 ENCOUNTER — Other Ambulatory Visit: Payer: Self-pay

## 2022-03-13 DIAGNOSIS — I7121 Aneurysm of the ascending aorta, without rupture: Secondary | ICD-10-CM

## 2022-03-13 DIAGNOSIS — E039 Hypothyroidism, unspecified: Secondary | ICD-10-CM

## 2022-03-13 DIAGNOSIS — K122 Cellulitis and abscess of mouth: Secondary | ICD-10-CM

## 2022-03-13 DIAGNOSIS — F419 Anxiety disorder, unspecified: Secondary | ICD-10-CM | POA: Diagnosis not present

## 2022-03-13 DIAGNOSIS — K047 Periapical abscess without sinus: Secondary | ICD-10-CM

## 2022-03-13 DIAGNOSIS — I160 Hypertensive urgency: Secondary | ICD-10-CM

## 2022-03-13 DIAGNOSIS — G43909 Migraine, unspecified, not intractable, without status migrainosus: Secondary | ICD-10-CM | POA: Diagnosis not present

## 2022-03-13 DIAGNOSIS — G894 Chronic pain syndrome: Secondary | ICD-10-CM

## 2022-03-13 DIAGNOSIS — R0789 Other chest pain: Secondary | ICD-10-CM

## 2022-03-13 DIAGNOSIS — F32A Depression, unspecified: Secondary | ICD-10-CM

## 2022-03-13 DIAGNOSIS — D649 Anemia, unspecified: Secondary | ICD-10-CM

## 2022-03-13 DIAGNOSIS — I712 Thoracic aortic aneurysm, without rupture, unspecified: Secondary | ICD-10-CM

## 2022-03-13 LAB — CBC
HCT: 33.1 % — ABNORMAL LOW (ref 36.0–46.0)
Hemoglobin: 10.6 g/dL — ABNORMAL LOW (ref 12.0–15.0)
MCH: 31.5 pg (ref 26.0–34.0)
MCHC: 32 g/dL (ref 30.0–36.0)
MCV: 98.2 fL (ref 80.0–100.0)
Platelets: 170 10*3/uL (ref 150–400)
RBC: 3.37 MIL/uL — ABNORMAL LOW (ref 3.87–5.11)
RDW: 13 % (ref 11.5–15.5)
WBC: 4.7 10*3/uL (ref 4.0–10.5)
nRBC: 0 % (ref 0.0–0.2)

## 2022-03-13 LAB — BASIC METABOLIC PANEL
Anion gap: 8 (ref 5–15)
BUN: 14 mg/dL (ref 8–23)
CO2: 20 mmol/L — ABNORMAL LOW (ref 22–32)
Calcium: 8.6 mg/dL — ABNORMAL LOW (ref 8.9–10.3)
Chloride: 114 mmol/L — ABNORMAL HIGH (ref 98–111)
Creatinine, Ser: 1.15 mg/dL — ABNORMAL HIGH (ref 0.44–1.00)
GFR, Estimated: 54 mL/min — ABNORMAL LOW (ref >60)
Glucose, Bld: 136 mg/dL — ABNORMAL HIGH (ref 70–99)
Potassium: 4.3 mmol/L (ref 3.5–5.1)
Sodium: 142 mmol/L (ref 135–145)

## 2022-03-13 LAB — HIV ANTIBODY (ROUTINE TESTING W REFLEX): HIV Screen 4th Generation wRfx: NONREACTIVE

## 2022-03-13 LAB — MRSA NEXT GEN BY PCR, NASAL: MRSA by PCR Next Gen: NOT DETECTED

## 2022-03-13 MED ORDER — HYDROMORPHONE HCL 1 MG/ML IJ SOLN: 0.5000 mg | INTRAMUSCULAR | Status: DC | PRN

## 2022-03-13 MED ORDER — OXYCODONE HCL 5 MG PO TABS
5.0000 mg | ORAL_TABLET | Freq: Four times a day (QID) | ORAL | Status: DC | PRN
  Administered 2022-03-14: 5 mg via ORAL
  Filled 2022-03-13: qty 1

## 2022-03-13 MED ORDER — KETOROLAC TROMETHAMINE 30 MG/ML IJ SOLN
30.0000 mg | Freq: Once | INTRAMUSCULAR | Status: AC
Start: 2022-03-13 — End: 2022-03-13
  Administered 2022-03-13: 30 mg via INTRAVENOUS
  Filled 2022-03-13: qty 1

## 2022-03-13 MED ORDER — CHLORHEXIDINE GLUCONATE CLOTH 2 % EX PADS
6.0000 | MEDICATED_PAD | Freq: Every day | CUTANEOUS | Status: DC
  Administered 2022-03-13: 6 via TOPICAL

## 2022-03-13 NOTE — Assessment & Plan Note (Signed)
-   We will continue Ativan and Wellbutrin XL.

## 2022-03-13 NOTE — Assessment & Plan Note (Signed)
-   We will continue Nucynta.

## 2022-03-13 NOTE — Assessment & Plan Note (Signed)
-   This will need follow-up as mentioned above.

## 2022-03-13 NOTE — Progress Notes (Signed)
PROGRESS NOTE  Nicole Morris [id number] DOB: [birth year]   PCP: Harlan Stains, MD  Patient is from: Home.  Lives with husband.  Independently ambulates at baseline.  DOA: 03/12/2022 LOS: 1  Chief complaints Chief Complaint  Patient presents with   Abscess     Brief Narrative / Interim history: 61 year old F with PMH of chronic low back pain, migraine headache, anxiety, asthma, HLD, hypothyroidism and dental caries presenting with acute onset right-sided facial and submandibular swelling, tenderness and pain, and admitted with concern for Ludewig's angina.  CT soft tissue neck with contrast revealed soft tissue swelling in the right submandibular region and floor of the mouth suggesting cellulitis, possibly dental infection arising from the lower 1 remaining right mandibular moral tooth but no abscess.  There was also incidental finding of 4.2 cm ascending aortic aneurysm.  Patient was started on broad-spectrum antibiotics.  ENT consulted.   The next day, pain improved.  Voicemail left for on-call dental surgeon for consultation.  Subjective: Seen and examined earlier this morning.  No major events overnight of this morning.  She denies pain but tenderness to palpation.  Denies difficulty swallowing or shortness of breath.  The swelling seems to have improved a little bit.  Husband at bedside.  Objective: Vitals:   03/13/22 0829 03/13/22 0900 03/13/22 1000 03/13/22 1100  BP:  132/79 (!) 141/78 126/67  Pulse: 91 63 66 63  Resp:      Temp:      TempSrc:      SpO2: 96% 99% 98% 99%  Weight:      Height:        Examination:  GENERAL: No apparent distress.  Nontoxic. HEENT: MMM.  Some swelling around left submandibular region.  Tender to palpation.  No erythema or loculation.  Dental caries of lone lower molar in the right.  No drainage or signs of abscess.  No trismus.   NECK: Supple.  No apparent JVD.  RESP:  No IWOB.  Fair aeration bilaterally. CVS:  RRR. Heart sounds  normal.  ABD/GI/GU: BS+. Abd soft, NTND.  MSK/EXT:  Moves extremities. No apparent deformity. No edema.  SKIN: no apparent skin lesion or wound NEURO: Awake, alert and oriented appropriately.  No apparent focal neuro deficit. PSYCH: Calm. Normal affect.   Procedures:  None  Microbiology summarized: Blood cultures NGTD. MRSA PCR screen pending.  Assessment and plan: Principal Problem:   Ludwig's angina Active Problems:   Anxiety and depression   Migraine   Hypothyroidism   Chronic pain syndrome   Thoracic aortic aneurysm (HCC)  Possible Ludwig's angina/dental infection: still with some swelling in the right submandibular region with concern for dental infection.  Blood cultures NGTD.  Seems to be improving. -Continue antibiotics with IV Unasyn and vancomycin -Follow ENT recommendation -Left voicemail for on-call dental surgeon for consultation.  Atypical chest pain: Reportedly felt some cramping pain.  This is intermittent.  EKG without acute ischemic finding.  Esophageal spasm? -May consider calcium channel blocker  CKD-3A: Stable. Recent Labs    03/12/22 1643 03/13/22 0335  BUN 13 14  CREATININE 1.19* 1.15*  -Continue monitoring    Thoracic aortic aneurysm: Incidental finding of 4.2 cm ascending aortic aneurysm -Blood pressure control -Follow-up CTa or MRa in 12 months   Chronic pain syndrome -Continue home Nucynta.   Normocytic anemia: Slight drop in Hgb likely dilutional. Recent Labs    03/12/22 1643 03/13/22 0335  HGB 11.5* 10.6*  -Continue monitoring -Check anemia panel in the morning  Hypothyroidism -  Continue home Synthroid   Acute migraine -continue Fioricet and Maxalt.   Anxiety and depression: Stable -continue Ativan and Wellbutrin XL.  Body mass index is 23.77 kg/m.           DVT prophylaxis:  enoxaparin (LOVENOX) injection 40 mg Start: 03/12/22 2200  Code Status: Full code Family Communication: Updated patient's husband at  bedside Level of care: Med-Surg Status is: Inpatient Remains inpatient appropriate because: Due to submandibular cellulitis/dental infection and concern for Ludewig's angina.   Final disposition: Likely home once medically cleared Consultants:  ENT consulted on admission Dental surgery  Sch Meds:  Scheduled Meds:  buPROPion  300 mg Oral Daily   Chlorhexidine Gluconate Cloth  6 each Topical Daily   enoxaparin (LOVENOX) injection  40 mg Subcutaneous Q24H   levothyroxine  50 mcg Oral QAC breakfast   methylPREDNISolone (SOLU-MEDROL) injection  40 mg Intravenous Q12H   rosuvastatin  5 mg Oral QHS   sertraline  100 mg Oral QPM   tapentadol  50 mg Oral Q12H   Continuous Infusions:  sodium chloride 100 mL/hr at 03/13/22 1229   ampicillin-sulbactam (UNASYN) IV Stopped (03/13/22 1226)   promethazine (PHENERGAN) injection (IM or IVPB) Stopped (03/12/22 1957)   vancomycin     PRN Meds:.acetaminophen **OR** acetaminophen, albuterol, butalbital-acetaminophen-caffeine, LORazepam, magnesium hydroxide, ondansetron **OR** ondansetron (ZOFRAN) IV, mouth rinse, promethazine (PHENERGAN) injection (IM or IVPB), SUMAtriptan, tiZANidine, traZODone, triamcinolone  Antimicrobials: Anti-infectives (From admission, onward)    Start     Dose/Rate Route Frequency Ordered Stop   03/13/22 2100  vancomycin (VANCOREADY) IVPB 750 mg/150 mL        750 mg 150 mL/hr over 60 Minutes Intravenous Every 24 hours 03/12/22 2001     03/13/22 0000  Ampicillin-Sulbactam (UNASYN) 3 g in sodium chloride 0.9 % 100 mL IVPB        3 g 200 mL/hr over 30 Minutes Intravenous Every 6 hours 03/12/22 1927     03/12/22 2000  vancomycin (VANCOREADY) IVPB 1250 mg/250 mL        1,250 mg 166.7 mL/hr over 90 Minutes Intravenous  Once 03/12/22 1952 03/12/22 2255   03/12/22 1930  vancomycin (VANCOCIN) IVPB 1000 mg/200 mL premix  Status:  Discontinued        1,000 mg 200 mL/hr over 60 Minutes Intravenous  Once 03/12/22 1925 03/12/22 1926    03/12/22 1930  meropenem (MERREM) 1 g in sodium chloride 0.9 % 100 mL IVPB  Status:  Discontinued        1 g 200 mL/hr over 30 Minutes Intravenous  Once 03/12/22 1925 03/12/22 1926   03/12/22 1930  Ampicillin-Sulbactam (UNASYN) 3 g in sodium chloride 0.9 % 100 mL IVPB  Status:  Discontinued        3 g 200 mL/hr over 30 Minutes Intravenous Every 6 hours 03/12/22 1925 03/12/22 1927   03/12/22 1745  Ampicillin-Sulbactam (UNASYN) 3 g in sodium chloride 0.9 % 100 mL IVPB        3 g 200 mL/hr over 30 Minutes Intravenous  Once 03/12/22 1741 03/12/22 1834        I have personally reviewed the following labs and images: CBC: Recent Labs  Lab 03/12/22 1643 03/13/22 0335  WBC 7.3 4.7  NEUTROABS 5.2  --   HGB 11.5* 10.6*  HCT 34.6* 33.1*  MCV 94.8 98.2  PLT 170 170   BMP &GFR Recent Labs  Lab 03/12/22 1643 03/13/22 0335  NA 142 142  K 4.5 4.3  CL 111 114*  CO2 23 20*  GLUCOSE 106* 136*  BUN 13 14  CREATININE 1.19* 1.15*  CALCIUM 9.5 8.6*   Estimated Creatinine Clearance: 41.6 mL/min (A) (by C-G formula based on SCr of 1.15 mg/dL (H)). Liver & Pancreas: No results for input(s): "AST", "ALT", "ALKPHOS", "BILITOT", "PROT", "ALBUMIN" in the last 168 hours. No results for input(s): "LIPASE", "AMYLASE" in the last 168 hours. No results for input(s): "AMMONIA" in the last 168 hours. Diabetic: No results for input(s): "HGBA1C" in the last 72 hours. No results for input(s): "GLUCAP" in the last 168 hours. Cardiac Enzymes: No results for input(s): "CKTOTAL", "CKMB", "CKMBINDEX", "TROPONINI" in the last 168 hours. No results for input(s): "PROBNP" in the last 8760 hours. Coagulation Profile: No results for input(s): "INR", "PROTIME" in the last 168 hours. Thyroid Function Tests: No results for input(s): "TSH", "T4TOTAL", "FREET4", "T3FREE", "THYROIDAB" in the last 72 hours. Lipid Profile: No results for input(s): "CHOL", "HDL", "LDLCALC", "TRIG", "CHOLHDL", "LDLDIRECT" in the  last 72 hours. Anemia Panel: No results for input(s): "VITAMINB12", "FOLATE", "FERRITIN", "TIBC", "IRON", "RETICCTPCT" in the last 72 hours. Urine analysis: No results found for: "COLORURINE", "APPEARANCEUR", "LABSPEC", "PHURINE", "GLUCOSEU", "HGBUR", "BILIRUBINUR", "KETONESUR", "PROTEINUR", "UROBILINOGEN", "NITRITE", "LEUKOCYTESUR" Sepsis Labs: Invalid input(s): "PROCALCITONIN", "LACTICIDVEN"  Microbiology: Recent Results (from the past 240 hour(s))  Culture, blood (routine x 2)     Status: None (Preliminary result)   Collection Time: 03/12/22  6:59 PM   Specimen: BLOOD  Result Value Ref Range Status   Specimen Description   Final    BLOOD BLOOD RIGHT ARM Performed at [facility], [address], [place], [place] [address]    Special Requests   Final    BOTTLES DRAWN AEROBIC AND ANAEROBIC Blood Culture adequate volume Performed at [facility], [address], [place], [place] [address]    Culture   Final    NO GROWTH < 12 HOURS Performed at [facility], [address] [place], [place] [address]    Report Status PENDING  Incomplete  Culture, blood (routine x 2)     Status: None (Preliminary result)   Collection Time: 03/12/22  7:00 PM   Specimen: BLOOD  Result Value Ref Range Status   Specimen Description   Final    BLOOD BLOOD RIGHT FOREARM Performed at [facility], [address], [place], [place] [address]    Special Requests   Final    BOTTLES DRAWN AEROBIC AND ANAEROBIC Blood Culture adequate volume Performed at [facility], [address] [place], [place] [address]    Culture   Final    NO GROWTH < 12 HOURS Performed at [facility], [address] [place], [place] [address]    Report Status PENDING  Incomplete    Radiology Studies: CT Soft Tissue Neck W Contrast  Result Date: 03/12/2022 CLINICAL DATA:  Right lower facial soft tissue swelling. EXAM: CT NECK WITH  CONTRAST TECHNIQUE: Multidetector CT imaging of the neck was performed using the standard protocol following the bolus administration of intravenous contrast. RADIATION DOSE REDUCTION: This exam was performed according to the departmental dose-optimization program which includes automated exposure control, adjustment of the mA and/or kV according to patient size and/or use of iterative reconstruction technique. CONTRAST:  9mL OMNIPAQUE IOHEXOL 300 MG/ML  SOLN COMPARISON:  Chest CT 01/15/2020. FINDINGS: Pharynx and larynx: Mild motion artifact. No mass. Widely patent airway. No retropharyngeal fluid collection. Salivary glands: Soft tissue swelling and inflammatory changes in the  right submandibular space and right floor of mouth with some extension across the midline in the submental region. No organized fluid collection. Normal appearance of the submandibular glands themselves as well as of the parotid glands. No salivary stone or mass. Thyroid: Unremarkable. Lymph nodes: Asymmetrically enlarged right level IB and II lymph nodes which remain subcentimeter in short axis and likely reactive. Vascular: Major vascular structures of the neck are grossly patent. Partially visualized aneurysmal dilatation of the ascending aorta with 4.2 cm maximal diameter, unchanged from the prior chest CT. Limited intracranial: Unremarkable. Visualized orbits: Not imaged. Mastoids and visualized paranasal sinuses: Clear. Skeleton: Multiple dental caries. Periapical lucencies most notably involving the left-sided mandibular canines with disruption of the overlying buccal cortex of the mandible. The lone remaining right mandibular molar tooth contains a cavity and demonstrates mild periapical lucency with disruption of the lingual cortex of the mandible, and the above described soft tissue inflammation may be originating from this location. Upper chest: Clear lung apices. Other: None. IMPRESSION: 1. Soft tissue swelling in the right  submandibular region and floor of mouth suggesting cellulitis, possibly dental infection arising from the lone remaining right mandibular molar tooth. No abscess. 2. 4.2 cm ascending aortic aneurysm. Recommend annual imaging followup by CTA or MRA. This recommendation follows 2010 ACCF/AHA/AATS/ACR/ASA/SCA/SCAI/SIR/STS/SVM Guidelines for the Diagnosis and Management of Patients with Thoracic Aortic Disease. Circulation. 2010; 121: Z610-R604. Aortic aneurysm NOS (ICD10-I71.9) Electronically Signed   By: Sebastian Ache M.D.   On: 03/12/2022 18:21      Zanayah Shadowens T. Chigozie Basaldua [facility]  If 7PM-7AM, please contact night-coverage [email or web address] 03/13/2022, 12:34 PM

## 2022-03-13 NOTE — Assessment & Plan Note (Signed)
-   The patient will be admitted to a stepdown unit bed. - We will monitor airway closely. - We will continue antibiotic therapy with IV Unasyn and vancomycin. - Pain management will be provided. - ENT consult will be obtained. - Dr. Jenne Pane was notified about the patient and is aware. - The patient may need oral surgery consult as well that can be called in AM.

## 2022-03-13 NOTE — Assessment & Plan Note (Signed)
-   Likely due to pain. - P.R.N. Hydralazine and will monitor BP.

## 2022-03-13 NOTE — Assessment & Plan Note (Signed)
-   We will continue Fioricet and Maxalt.

## 2022-03-13 NOTE — Assessment & Plan Note (Signed)
-   We will continue Synthroid. 

## 2022-03-13 NOTE — Progress Notes (Signed)
  Transition of Care (TOC) Screening Note   Patient Details  Name: Nicole Morris Date of Birth: 1960/10/21   Transition of Care [facility]) CM/SW Contact:    Wing Gfeller, Meriam Sprague, RN Phone Number: 03/13/2022, 2:17 PM    Transition of Care Department [facility]) has reviewed patient and no TOC needs have been identified at this time. We will continue to monitor patient advancement through interdisciplinary progression rounds. If new patient transition needs arise, please place a TOC consult.

## 2022-03-14 DIAGNOSIS — G43909 Migraine, unspecified, not intractable, without status migrainosus: Secondary | ICD-10-CM | POA: Diagnosis not present

## 2022-03-14 DIAGNOSIS — K047 Periapical abscess without sinus: Secondary | ICD-10-CM

## 2022-03-14 DIAGNOSIS — K122 Cellulitis and abscess of mouth: Secondary | ICD-10-CM | POA: Diagnosis not present

## 2022-03-14 DIAGNOSIS — F419 Anxiety disorder, unspecified: Secondary | ICD-10-CM | POA: Diagnosis not present

## 2022-03-14 DIAGNOSIS — G894 Chronic pain syndrome: Secondary | ICD-10-CM | POA: Diagnosis not present

## 2022-03-14 LAB — RENAL FUNCTION PANEL
Albumin: 3.3 g/dL — ABNORMAL LOW (ref 3.5–5.0)
Anion gap: 8 (ref 5–15)
BUN: 20 mg/dL (ref 8–23)
CO2: 20 mmol/L — ABNORMAL LOW (ref 22–32)
Calcium: 8.6 mg/dL — ABNORMAL LOW (ref 8.9–10.3)
Chloride: 114 mmol/L — ABNORMAL HIGH (ref 98–111)
Creatinine, Ser: 1.04 mg/dL — ABNORMAL HIGH (ref 0.44–1.00)
GFR, Estimated: >60 mL/min (ref >60)
Glucose, Bld: 97 mg/dL (ref 70–99)
Phosphorus: 3.8 mg/dL (ref 2.5–4.6)
Potassium: 4.5 mmol/L (ref 3.5–5.1)
Sodium: 142 mmol/L (ref 135–145)

## 2022-03-14 LAB — CBC
HCT: 33.6 % — ABNORMAL LOW (ref 36.0–46.0)
Hemoglobin: 10.7 g/dL — ABNORMAL LOW (ref 12.0–15.0)
MCH: 31.2 pg (ref 26.0–34.0)
MCHC: 31.8 g/dL (ref 30.0–36.0)
MCV: 98 fL (ref 80.0–100.0)
Platelets: 185 10*3/uL (ref 150–400)
RBC: 3.43 MIL/uL — ABNORMAL LOW (ref 3.87–5.11)
RDW: 13.2 % (ref 11.5–15.5)
WBC: 7.7 10*3/uL (ref 4.0–10.5)
nRBC: 0 % (ref 0.0–0.2)

## 2022-03-14 LAB — MAGNESIUM: Magnesium: 2.2 mg/dL (ref 1.7–2.4)

## 2022-03-14 NOTE — Progress Notes (Signed)
PROGRESS NOTE  Nicole Morris ONG:[id number] DOB: [birth year]   PCP: Laurann Montana, MD  Patient is from: Home.  Lives with husband.  Independently ambulates at baseline.  DOA: 03/12/2022 LOS: 2  Chief complaints Chief Complaint  Patient presents with   Abscess     Brief Narrative / Interim history: 61 year old F with PMH of chronic low back pain, migraine headache, anxiety, asthma, HLD, hypothyroidism and dental caries presenting with acute onset right-sided facial and submandibular swelling, tenderness and pain, and admitted with concern for Ludewig's angina.  CT soft tissue neck with contrast revealed soft tissue swelling in the right submandibular region and floor of the mouth suggesting cellulitis, possibly dental infection arising from the lower 1 remaining right mandibular moral tooth but no abscess.  There was also incidental finding of 4.2 cm ascending aortic aneurysm.  Patient was started on broad-spectrum antibiotics.  ENT consulted.   The next day, pain improved.  Voicemail left for on-call dental surgeon on 7/7.  Subjective: Seen and examined earlier this morning.  No major events overnight of this morning.  No complaints.  Pain and swelling has improved.  Denies shortness of breath.  Has not tried solid food yet.  Objective: Vitals:   03/14/22 0400 03/14/22 0551 03/14/22 0800 03/14/22 1200  BP:  (!) 147/85    Pulse:  (!) 54    Resp:  13    Temp: 98.1 F (36.7 C)  98.2 F (36.8 C) 98.1 F (36.7 C)  TempSrc: Oral  Oral Oral  SpO2:  96%    Weight:      Height:        Examination:   GENERAL: No apparent distress.  Nontoxic. HEENT: MMM.  Right submandibular swelling.  Mild tenderness.  No erythema or abscess.  Dental caries of lone lower molar teeth.  No trismus. NECK: Supple.  No apparent JVD.  RESP:  No IWOB.  Fair aeration bilaterally. CVS:  RRR. Heart sounds normal.  ABD/GI/GU: BS+. Abd soft, NTND.  MSK/EXT:  Moves extremities. No apparent deformity. No  edema.  SKIN: no apparent skin lesion or wound NEURO: Awake and alert. Oriented appropriately.  No apparent focal neuro deficit. PSYCH: Calm. Normal affect.   Procedures:  None  Microbiology summarized: Blood cultures NGTD. MRSA PCR screen negative.  Assessment and plan: Principal Problem:   Ludwig's angina Active Problems:   Hypertensive urgency   Anxiety and depression   Migraine   Hypothyroidism   Chronic pain syndrome   Thoracic aortic aneurysm (HCC)   Normocytic anemia   Dental infection   Atypical chest pain  Possible Ludwig's angina/dental infection: still with some swelling in the right submandibular region with concern for dental infection.  Blood cultures NGTD.  MRSA PCR screen negative.  Seems to be improving. -Continue IV Unasyn -Discontinue IV vancomycin -Waiting to hear from on call dental surgeon.  Left voicemail on 7/7. -She may go home on oral antibiotics and follow-up with a dental surgeon if she continues to improve -As needed Tylenol, oxycodone and Dilaudid based on pain severity -Allow soft diet.  Atypical chest pain: Reported intermittent cramping pain in her chest.  Not sure if this is esophageal spasm or atypical migraine.  EKG basically normal. -May consider calcium channel blocker  CKD-3A: Stable. Recent Labs    03/12/22 1643 03/13/22 0335 03/14/22 0317  BUN 13 14 20   CREATININE 1.19* 1.15* 1.04*  -Continue monitoring  Thoracic aortic aneurysm: Incidental finding of 4.2 cm ascending aortic aneurysm -Blood pressure control -Follow-up CTA  or MRA in 12 months   Chronic pain syndrome -Continue home Nucynta.   Normocytic anemia: Slight drop in Hgb likely dilutional. Recent Labs    03/12/22 1643 03/13/22 0335 03/14/22 0317  HGB 11.5* 10.6* 10.7*  -Continue monitoring -Check anemia panel in the morning  Hypothyroidism -Continue home Synthroid   Acute migraine -continue Fioricet and Imitrex   Anxiety and depression:  Stable -continue Ativan and Wellbutrin XL.  Body mass index is 23.77 kg/m.           DVT prophylaxis:  enoxaparin (LOVENOX) injection 40 mg Start: 03/12/22 2200  Code Status: Full code Family Communication: Updated patient's husband at bedside Level of care: Med-Surg Status is: Inpatient Remains inpatient appropriate because: Due to submandibular cellulitis/dental infection and concern for Ludewig's angina.   Final disposition: Likely home once medically cleared Consultants:  ENT consulted on admission Dental surgery  Sch Meds:  Scheduled Meds:  buPROPion  300 mg Oral Daily   Chlorhexidine Gluconate Cloth  6 each Topical Daily   enoxaparin (LOVENOX) injection  40 mg Subcutaneous Q24H   levothyroxine  50 mcg Oral QAC breakfast   methylPREDNISolone (SOLU-MEDROL) injection  40 mg Intravenous Q12H   rosuvastatin  5 mg Oral QHS   sertraline  100 mg Oral QPM   tapentadol  50 mg Oral Q12H   Continuous Infusions:  ampicillin-sulbactam (UNASYN) IV Stopped (03/14/22 0807)   promethazine (PHENERGAN) injection (IM or IVPB) Stopped (03/12/22 1957)   PRN Meds:.acetaminophen **OR** acetaminophen, albuterol, butalbital-acetaminophen-caffeine, HYDROmorphone (DILAUDID) injection, LORazepam, magnesium hydroxide, ondansetron **OR** ondansetron (ZOFRAN) IV, mouth rinse, oxyCODONE, promethazine (PHENERGAN) injection (IM or IVPB), SUMAtriptan, tiZANidine, traZODone, triamcinolone  Antimicrobials: Anti-infectives (From admission, onward)    Start     Dose/Rate Route Frequency Ordered Stop   03/13/22 2100  vancomycin (VANCOREADY) IVPB 750 mg/150 mL  Status:  Discontinued        750 mg 150 mL/hr over 60 Minutes Intravenous Every 24 hours 03/12/22 2001 03/14/22 0716   03/13/22 0000  Ampicillin-Sulbactam (UNASYN) 3 g in sodium chloride 0.9 % 100 mL IVPB        3 g 200 mL/hr over 30 Minutes Intravenous Every 6 hours 03/12/22 1927     03/12/22 2000  vancomycin (VANCOREADY) IVPB 1250 mg/250 mL         1,250 mg 166.7 mL/hr over 90 Minutes Intravenous  Once 03/12/22 1952 03/12/22 2255   03/12/22 1930  vancomycin (VANCOCIN) IVPB 1000 mg/200 mL premix  Status:  Discontinued        1,000 mg 200 mL/hr over 60 Minutes Intravenous  Once 03/12/22 1925 03/12/22 1926   03/12/22 1930  meropenem (MERREM) 1 g in sodium chloride 0.9 % 100 mL IVPB  Status:  Discontinued        1 g 200 mL/hr over 30 Minutes Intravenous  Once 03/12/22 1925 03/12/22 1926   03/12/22 1930  Ampicillin-Sulbactam (UNASYN) 3 g in sodium chloride 0.9 % 100 mL IVPB  Status:  Discontinued        3 g 200 mL/hr over 30 Minutes Intravenous Every 6 hours 03/12/22 1925 03/12/22 1927   03/12/22 1745  Ampicillin-Sulbactam (UNASYN) 3 g in sodium chloride 0.9 % 100 mL IVPB        3 g 200 mL/hr over 30 Minutes Intravenous  Once 03/12/22 1741 03/12/22 1834        I have personally reviewed the following labs and images: CBC: Recent Labs  Lab 03/12/22 1643 03/13/22 0335 03/14/22 0317  WBC 7.3 4.7  7.7  NEUTROABS 5.2  --   --   HGB 11.5* 10.6* 10.7*  HCT 34.6* 33.1* 33.6*  MCV 94.8 98.2 98.0  PLT 170 170 185   BMP &GFR Recent Labs  Lab 03/12/22 1643 03/13/22 0335 03/14/22 0317  NA 142 142 142  K 4.5 4.3 4.5  CL 111 114* 114*  CO2 23 20* 20*  GLUCOSE 106* 136* 97  BUN 13 14 20   CREATININE 1.19* 1.15* 1.04*  CALCIUM 9.5 8.6* 8.6*  MG  --   --  2.2  PHOS  --   --  3.8   Estimated Creatinine Clearance: 46 mL/min (A) (by C-G formula based on SCr of 1.04 mg/dL (H)). Liver & Pancreas: Recent Labs  Lab 03/14/22 0317  ALBUMIN 3.3*   No results for input(s): "LIPASE", "AMYLASE" in the last 168 hours. No results for input(s): "AMMONIA" in the last 168 hours. Diabetic: No results for input(s): "HGBA1C" in the last 72 hours. No results for input(s): "GLUCAP" in the last 168 hours. Cardiac Enzymes: No results for input(s): "CKTOTAL", "CKMB", "CKMBINDEX", "TROPONINI" in the last 168 hours. No results for input(s):  "PROBNP" in the last 8760 hours. Coagulation Profile: No results for input(s): "INR", "PROTIME" in the last 168 hours. Thyroid Function Tests: No results for input(s): "TSH", "T4TOTAL", "FREET4", "T3FREE", "THYROIDAB" in the last 72 hours. Lipid Profile: No results for input(s): "CHOL", "HDL", "LDLCALC", "TRIG", "CHOLHDL", "LDLDIRECT" in the last 72 hours. Anemia Panel: No results for input(s): "VITAMINB12", "FOLATE", "FERRITIN", "TIBC", "IRON", "RETICCTPCT" in the last 72 hours. Urine analysis: No results found for: "COLORURINE", "APPEARANCEUR", "LABSPEC", "PHURINE", "GLUCOSEU", "HGBUR", "BILIRUBINUR", "KETONESUR", "PROTEINUR", "UROBILINOGEN", "NITRITE", "LEUKOCYTESUR" Sepsis Labs: Invalid input(s): "PROCALCITONIN", "LACTICIDVEN"  Microbiology: Recent Results (from the past 240 hour(s))  Culture, blood (routine x 2)     Status: None (Preliminary result)   Collection Time: 03/12/22  6:59 PM   Specimen: BLOOD  Result Value Ref Range Status   Specimen Description   Final    BLOOD BLOOD RIGHT ARM Performed at [facility], [address] [place], [place]    Special Requests   Final    BOTTLES DRAWN AEROBIC AND ANAEROBIC Blood Culture adequate volume Performed at [facility], [address] [place], [place]    Culture   Final    NO GROWTH 2 DAYS Performed at [facility], [address] [place], [place]    Report Status PENDING  Incomplete  Culture, blood (routine x 2)     Status: None (Preliminary result)   Collection Time: 03/12/22  7:00 PM   Specimen: BLOOD  Result Value Ref Range Status   Specimen Description   Final    BLOOD BLOOD RIGHT FOREARM Performed at [facility], [address] [place], [place]    Special Requests   Final    BOTTLES DRAWN AEROBIC AND ANAEROBIC Blood Culture adequate volume Performed at [facility], [address],  [place], [place]    Culture   Final    NO GROWTH 2 DAYS Performed at [facility], [address], [place], [place]    Report Status PENDING  Incomplete  MRSA Next Gen by PCR, Nasal     Status: None   Collection Time: 03/13/22  1:12 PM   Specimen: Nasal Mucosa; Nasal Swab  Result Value Ref Range Status   MRSA by PCR Next Gen NOT DETECTED NOT DETECTED Final  Comment: (NOTE) The GeneXpert MRSA Assay (FDA approved for NASAL specimens only), is one component of a comprehensive MRSA colonization surveillance program. It is not intended to diagnose MRSA infection nor to guide or monitor treatment for MRSA infections. Test performance is not FDA approved in patients less than 39 years old. Performed at [facility], [address], [place], [place] [address]     Radiology Studies: No results found.    Zanovia Rotz T. Jarre[facility]  If 7PM-7AM, please contact night-coverage [email or web address] 03/14/2022, 1:29 PM

## 2022-03-15 DIAGNOSIS — I160 Hypertensive urgency: Secondary | ICD-10-CM | POA: Diagnosis not present

## 2022-03-15 DIAGNOSIS — K122 Cellulitis and abscess of mouth: Secondary | ICD-10-CM | POA: Diagnosis not present

## 2022-03-15 DIAGNOSIS — R0789 Other chest pain: Secondary | ICD-10-CM | POA: Diagnosis not present

## 2022-03-15 DIAGNOSIS — F419 Anxiety disorder, unspecified: Secondary | ICD-10-CM | POA: Diagnosis not present

## 2022-03-15 LAB — CBC
HCT: 29.9 % — ABNORMAL LOW (ref 36.0–46.0)
Hemoglobin: 9.7 g/dL — ABNORMAL LOW (ref 12.0–15.0)
MCH: 31.3 pg (ref 26.0–34.0)
MCHC: 32.4 g/dL (ref 30.0–36.0)
MCV: 96.5 fL (ref 80.0–100.0)
Platelets: 171 10*3/uL (ref 150–400)
RBC: 3.1 MIL/uL — ABNORMAL LOW (ref 3.87–5.11)
RDW: 13.1 % (ref 11.5–15.5)
WBC: 4.3 10*3/uL (ref 4.0–10.5)
nRBC: 0 % (ref 0.0–0.2)

## 2022-03-15 LAB — RENAL FUNCTION PANEL
Albumin: 3 g/dL — ABNORMAL LOW (ref 3.5–5.0)
Anion gap: 5 (ref 5–15)
BUN: 20 mg/dL (ref 8–23)
CO2: 22 mmol/L (ref 22–32)
Calcium: 8.5 mg/dL — ABNORMAL LOW (ref 8.9–10.3)
Chloride: 115 mmol/L — ABNORMAL HIGH (ref 98–111)
Creatinine, Ser: 1.02 mg/dL — ABNORMAL HIGH (ref 0.44–1.00)
GFR, Estimated: >60 mL/min (ref >60)
Glucose, Bld: 123 mg/dL — ABNORMAL HIGH (ref 70–99)
Phosphorus: 3.7 mg/dL (ref 2.5–4.6)
Potassium: 4.4 mmol/L (ref 3.5–5.1)
Sodium: 142 mmol/L (ref 135–145)

## 2022-03-15 LAB — MAGNESIUM: Magnesium: 2 mg/dL (ref 1.7–2.4)

## 2022-03-15 MED ORDER — AMOXICILLIN-POT CLAVULANATE 875-125 MG PO TABS: 1.0000 | ORAL_TABLET | Freq: Two times a day (BID) | ORAL | 0 refills | Status: AC

## 2022-03-15 MED ORDER — CHLORHEXIDINE GLUCONATE 0.12 % MT SOLN: 15.0000 mL | Freq: Two times a day (BID) | OROMUCOSAL | 0 refills | Status: DC

## 2022-03-15 NOTE — Discharge Summary (Signed)
Physician Discharge Summary  Nicole Morris ZTI:[id number] DOB: 25-Dec-1960 DOA: 03/12/2022  PCP: Laurann Montana, MD  Admit date: 03/12/2022 Discharge date: 03/15/2022 Admitted From: Home Disposition: Home Recommendations for Outpatient Follow-up:  Follow up with PCP in 1 week Patient to call her dentist as soon as possible to arrange outpatient follow-up for possible dental extraction Please obtain CBC and BMP in 1 week Please follow up on the following pending results: None  Home Health: Not indicated Equipment/Devices: Not indicated  Discharge Condition: Stable CODE STATUS: Full code  Follow-up Information     Laurann Montana, MD. Schedule an appointment as soon as possible for a visit in 1 week(s).   Specialty: Family Medicine Contact information: [address] [place] [address] [phone]                 Hospital course 61 year old F with PMH of chronic low back pain, migraine headache, anxiety, asthma, HLD, hypothyroidism and dental caries presenting with acute onset right-sided facial and submandibular swelling, tenderness and pain, and admitted with concern for Ludewig's angina.  CT soft tissue neck with contrast revealed soft tissue swelling in the right submandibular region and floor of the mouth suggesting cellulitis, possibly dental infection arising from the lower 1 remaining right mandibular moral tooth but no abscess.  There was also incidental finding of 4.2 cm ascending aortic aneurysm.  Patient was started on broad-spectrum antibiotics.    The next day, patient's symptoms improved.  Blood cultures negative.  MRSA PCR screen negative.  Antibiotic de-escalated to IV Unasyn.  On the day of discharge, swelling significantly improved.  Pain resolved.  She is discharged on p.o. Augmentin for 8 more days, and chlorhexidine mouthwash.  Advised to call her dentist as soon as possible to schedule hospital follow-up.  Discussed return precautions as  well.  See individual problem list below for more.   Problems addressed during this hospitalization Principal Problem:   Ludwig's angina Active Problems:   Hypertensive urgency   Anxiety and depression   Migraine   Hypothyroidism   Chronic pain syndrome   Thoracic aortic aneurysm (HCC)   Normocytic anemia   Dental infection   Atypical chest pain   Possible Ludwig's angina/dental infection: still with some swelling in the right submandibular region with concern for dental infection.  Blood cultures NGTD.  MRSA PCR screen negative.  Swelling improved.  Pain resolved. -Discharge on p.o. Augmentin for 8 more days, and chlorhexidine mouthwash -Patient to call her dentist and set up a follow-up appointment as soon as possible -Return precautions discussed.   Atypical chest pain: Possible esophageal spasm or atypical migraine.  EKG normal.  No further chest pain.    CKD-3A: Stable.   Thoracic aortic aneurysm: Incidental finding of 4.2 cm ascending aortic aneurysm -Blood pressure control -Follow-up CTA or MRA in 12 months   Chronic pain syndrome -Continue home medications.   Normocytic anemia: Slight drop in Hgb likely dilutional. -Check CBC at follow-up   Hypothyroidism -Continue home Synthroid   Acute migraine: Resolved. -Continue home meds   Anxiety and depression: Stable -continue home meds           Vital signs Vitals:   03/15/22 0328 03/15/22 0526 03/15/22 0800 03/15/22 0821  BP:  136/86 (!) 159/100   Pulse:  (!) 53 (!) 57 (!) 59  Temp: 97.7 F (36.5 C)   98.2 F (36.8 C)  Resp:  13    Height:      Weight:  SpO2:  95% 95% 96%  TempSrc: Oral   Oral  BMI (Calculated):         Discharge exam  GENERAL: No apparent distress.  Nontoxic. HEENT: MMM.  Vision and hearing grossly intact.  Slight residual right submandibular swelling.  Dental caries of lone lower molar tooth. NECK: Supple.  No apparent JVD.  RESP:  No IWOB.  Fair aeration  bilaterally. CVS:  RRR. Heart sounds normal.  ABD/GI/GU: BS+. Abd soft, NTND.  MSK/EXT:  Moves extremities. No apparent deformity. No edema.  SKIN: no apparent skin lesion or wound NEURO: Awake and alert. Oriented appropriately.  No apparent focal neuro deficit. PSYCH: Calm. Normal affect.   Discharge Instructions Discharge Instructions     Call MD for:  difficulty breathing, headache or visual disturbances   Complete by: As directed    Call MD for:  extreme fatigue   Complete by: As directed    Call MD for:  persistant dizziness or light-headedness   Complete by: As directed    Call MD for:  severe uncontrolled pain   Complete by: As directed    Call MD for:  temperature >100.4   Complete by: As directed    Diet general   Complete by: As directed    Discharge instructions   Complete by: As directed    It has been a pleasure taking care of you!  You were hospitalized due to dental infection for which you have been treated with antibiotics.  Your symptoms improved to the point we think it is safe to let you go home on oral antibiotic and follow-up with your dentist.  Please call your dentist office as soon as possible to schedule follow-up appointment.  It is very important that you take the whole course of antibiotics regardless of improvement.   Take care,   Increase activity slowly   Complete by: As directed       Allergies as of 03/15/2022       Reactions   Atorvastatin    Other reaction(s): joint pain   Meperidine    Other Other (See Comments)   Darvocet N "doesn't like the way it makes her feel"   Codeine Nausea Only   Metoclopramide    Akathisia   Mushroom Extract Complex Nausea Only   Nausea         Medication List     TAKE these medications    albuterol 108 (90 Base) MCG/ACT inhaler Commonly known as: VENTOLIN HFA Inhale 2 puffs into the lungs every 6 (six) hours as needed for wheezing or shortness of breath.   amoxicillin-clavulanate 875-125 MG  tablet Commonly known as: AUGMENTIN Take 1 tablet by mouth 2 (two) times daily for 8 days.   buPROPion 300 MG 24 hr tablet Commonly known as: WELLBUTRIN XL Take 300 mg by mouth daily.   butalbital-acetaminophen-caffeine 50-325-40 MG tablet Commonly known as: FIORICET Take 1 tablet by mouth 3 (three) times daily as needed for headache or migraine.   chlorhexidine 0.12 % solution Commonly known as: PERIDEX Use as directed 15 mLs in the mouth or throat 2 (two) times daily.   diclofenac sodium 1 % Gel Commonly known as: VOLTAREN Apply 2 g topically daily as needed (joint pain).   hydrocortisone 2.5 % cream Apply 1 Application topically daily as needed (itching).   levothyroxine 50 MCG tablet Commonly known as: SYNTHROID Take 50 mcg by mouth daily before breakfast.   LORazepam 0.5 MG tablet Commonly known as: ATIVAN Take 0.25-0.5 mg  by mouth every 8 (eight) hours as needed for anxiety.   ondansetron 4 MG tablet Commonly known as: ZOFRAN Take 1 tablet (4 mg total) by mouth as needed for nausea or vomiting. What changed: when to take this   rizatriptan 10 MG disintegrating tablet Commonly known as: MAXALT-MLT Take 1 tablet (10 mg total) by mouth as needed for migraine. May repeat in 2 hours if needed What changed: when to take this   rosuvastatin 5 MG tablet Commonly known as: CRESTOR Take 5 mg by mouth daily.   sertraline 100 MG tablet Commonly known as: ZOLOFT Take 100 mg by mouth every evening.   tapentadol 50 MG 12 hr tablet Commonly known as: NUCYNTA Take 50 mg by mouth every 12 (twelve) hours.   tiZANidine 2 MG tablet Commonly known as: ZANAFLEX Take 1 tablet (2 mg total) by mouth every 8 (eight) hours as needed for muscle spasms.   triamcinolone 55 MCG/ACT Aero nasal inhaler Commonly known as: NASACORT Place 1 spray into the nose daily as needed (congestion).        Consultations: None  Procedures/Studies:   CT Soft Tissue Neck W  Contrast  Result Date: 03/12/2022 CLINICAL DATA:  Right lower facial soft tissue swelling. EXAM: CT NECK WITH CONTRAST TECHNIQUE: Multidetector CT imaging of the neck was performed using the standard protocol following the bolus administration of intravenous contrast. RADIATION DOSE REDUCTION: This exam was performed according to the departmental dose-optimization program which includes automated exposure control, adjustment of the mA and/or kV according to patient size and/or use of iterative reconstruction technique. CONTRAST:  25mL OMNIPAQUE IOHEXOL 300 MG/ML  SOLN COMPARISON:  Chest CT 01/15/2020. FINDINGS: Pharynx and larynx: Mild motion artifact. No mass. Widely patent airway. No retropharyngeal fluid collection. Salivary glands: Soft tissue swelling and inflammatory changes in the right submandibular space and right floor of mouth with some extension across the midline in the submental region. No organized fluid collection. Normal appearance of the submandibular glands themselves as well as of the parotid glands. No salivary stone or mass. Thyroid: Unremarkable. Lymph nodes: Asymmetrically enlarged right level IB and II lymph nodes which remain subcentimeter in short axis and likely reactive. Vascular: Major vascular structures of the neck are grossly patent. Partially visualized aneurysmal dilatation of the ascending aorta with 4.2 cm maximal diameter, unchanged from the prior chest CT. Limited intracranial: Unremarkable. Visualized orbits: Not imaged. Mastoids and visualized paranasal sinuses: Clear. Skeleton: Multiple dental caries. Periapical lucencies most notably involving the left-sided mandibular canines with disruption of the overlying buccal cortex of the mandible. The lone remaining right mandibular molar tooth contains a cavity and demonstrates mild periapical lucency with disruption of the lingual cortex of the mandible, and the above described soft tissue inflammation may be originating from  this location. Upper chest: Clear lung apices. Other: None. IMPRESSION: 1. Soft tissue swelling in the right submandibular region and floor of mouth suggesting cellulitis, possibly dental infection arising from the lone remaining right mandibular molar tooth. No abscess. 2. 4.2 cm ascending aortic aneurysm. Recommend annual imaging followup by CTA or MRA. This recommendation follows 2010 ACCF/AHA/AATS/ACR/ASA/SCA/SCAI/SIR/STS/SVM Guidelines for the Diagnosis and Management of Patients with Thoracic Aortic Disease. Circulation. 2010; 121: J009-F818. Aortic aneurysm NOS (ICD10-I71.9) Electronically Signed   By: Sebastian Ache M.D.   On: 03/12/2022 18:21       The results of significant diagnostics from this hospitalization (including imaging, microbiology, ancillary and laboratory) are listed below for reference.     Microbiology: Recent Results (from  the past 240 hour(s))  Culture, blood (routine x 2)     Status: None (Preliminary result)   Collection Time: 03/12/22  6:59 PM   Specimen: BLOOD  Result Value Ref Range Status   Specimen Description   Final    BLOOD BLOOD RIGHT ARM Performed at [facility], [address] [place], [place] [address]    Special Requests   Final    BOTTLES DRAWN AEROBIC AND ANAEROBIC Blood Culture adequate volume Performed at [facility], [address], [place], [place] [address]    Culture   Final    NO GROWTH 3 DAYS Performed at [facility], [address], [place], [place] [address]    Report Status PENDING  Incomplete  Culture, blood (routine x 2)     Status: None (Preliminary result)   Collection Time: 03/12/22  7:00 PM   Specimen: BLOOD  Result Value Ref Range Status   Specimen Description   Final    BLOOD BLOOD RIGHT FOREARM Performed at [facility], [address] [place], [place] [address]    Special Requests   Final    BOTTLES DRAWN AEROBIC AND ANAEROBIC Blood Culture adequate  volume Performed at [facility], [address], [place], [place] [address]    Culture   Final    NO GROWTH 3 DAYS Performed at [facility], [address], [place], [place] [address]    Report Status PENDING  Incomplete  MRSA Next Gen by PCR, Nasal     Status: None   Collection Time: 03/13/22  1:12 PM   Specimen: Nasal Mucosa; Nasal Swab  Result Value Ref Range Status   MRSA by PCR Next Gen NOT DETECTED NOT DETECTED Final    Comment: (NOTE) The GeneXpert MRSA Assay (FDA approved for NASAL specimens only), is one component of a comprehensive MRSA colonization surveillance program. It is not intended to diagnose MRSA infection nor to guide or monitor treatment for MRSA infections. Test performance is not FDA approved in patients less than 10 years old. Performed at [facility], [address] [place], [place] [address]      Labs:  CBC: Recent Labs  Lab 03/12/22 1643 03/13/22 0335 03/14/22 0317 03/15/22 0311  WBC 7.3 4.7 7.7 4.3  NEUTROABS 5.2  --   --   --   HGB 11.5* 10.6* 10.7* 9.7*  HCT 34.6* 33.1* 33.6* 29.9*  MCV 94.8 98.2 98.0 96.5  PLT 170 170 185 171   BMP &GFR Recent Labs  Lab 03/12/22 1643 03/13/22 0335 03/14/22 0317 03/15/22 0311  NA 142 142 142 142  K 4.5 4.3 4.5 4.4  CL 111 114* 114* 115*  CO2 23 20* 20* 22  GLUCOSE 106* 136* 97 123*  BUN 13 14 20 20   CREATININE 1.19* 1.15* 1.04* 1.02*  CALCIUM 9.5 8.6* 8.6* 8.5*  MG  --   --  2.2 2.0  PHOS  --   --  3.8 3.7   Estimated Creatinine Clearance: 46.9 mL/min (A) (by C-G formula based on SCr of 1.02 mg/dL (H)). Liver & Pancreas: Recent Labs  Lab 03/14/22 0317 03/15/22 0311  ALBUMIN 3.3* 3.0*   No results for input(s): "LIPASE", "AMYLASE" in the last 168 hours. No results for input(s): "AMMONIA" in the last 168 hours. Diabetic: No results for input(s): "HGBA1C" in the last 72 hours. No results for input(s): "GLUCAP" in the last 168  hours. Cardiac Enzymes: No  results for input(s): "CKTOTAL", "CKMB", "CKMBINDEX", "TROPONINI" in the last 168 hours. No results for input(s): "PROBNP" in the last 8760 hours. Coagulation Profile: No results for input(s): "INR", "PROTIME" in the last 168 hours. Thyroid Function Tests: No results for input(s): "TSH", "T4TOTAL", "FREET4", "T3FREE", "THYROIDAB" in the last 72 hours. Lipid Profile: No results for input(s): "CHOL", "HDL", "LDLCALC", "TRIG", "CHOLHDL", "LDLDIRECT" in the last 72 hours. Anemia Panel: No results for input(s): "VITAMINB12", "FOLATE", "FERRITIN", "TIBC", "IRON", "RETICCTPCT" in the last 72 hours. Urine analysis: No results found for: "COLORURINE", "APPEARANCEUR", "LABSPEC", "PHURINE", "GLUCOSEU", "HGBUR", "BILIRUBINUR", "KETONESUR", "PROTEINUR", "UROBILINOGEN", "NITRITE", "LEUKOCYTESUR" Sepsis Labs: Invalid input(s): "PROCALCITONIN", "LACTICIDVEN"   SIGNED:  Almon Herculesaye T Cristin Szatkowski, MD  [facility] 03/15/2022, 11:33 AM

## 2022-03-15 NOTE — Progress Notes (Signed)
This nurse has reviewed the patients AVS with the patient and patients spouse. Patient has no further questions. IV's have been removed.

## 2022-03-16 ENCOUNTER — Ambulatory Visit (INDEPENDENT_AMBULATORY_CARE_PROVIDER_SITE_OTHER): Payer: No Typology Code available for payment source

## 2022-03-16 DIAGNOSIS — R55 Syncope and collapse: Secondary | ICD-10-CM

## 2022-03-16 LAB — CUP PACEART REMOTE DEVICE CHECK
Date Time Interrogation Session: 20230703230517
Implantable Pulse Generator Implant Date: 20210823

## 2022-03-17 LAB — CULTURE, BLOOD (ROUTINE X 2)
Culture: NO GROWTH
Culture: NO GROWTH
Special Requests: ADEQUATE
Special Requests: ADEQUATE

## 2022-03-18 ENCOUNTER — Encounter: Payer: Self-pay | Admitting: Cardiovascular Disease

## 2022-03-18 ENCOUNTER — Ambulatory Visit: Payer: No Typology Code available for payment source | Admitting: Cardiovascular Disease

## 2022-03-18 VITALS — BP 136/90 | HR 80 | Ht 62.5 in | Wt 130.4 lb

## 2022-03-18 DIAGNOSIS — R55 Syncope and collapse: Secondary | ICD-10-CM | POA: Diagnosis not present

## 2022-03-18 DIAGNOSIS — Z01810 Encounter for preprocedural cardiovascular examination: Secondary | ICD-10-CM | POA: Diagnosis not present

## 2022-03-18 DIAGNOSIS — E7800 Pure hypercholesterolemia, unspecified: Secondary | ICD-10-CM | POA: Diagnosis not present

## 2022-03-18 DIAGNOSIS — I7121 Aneurysm of the ascending aorta, without rupture: Secondary | ICD-10-CM

## 2022-03-18 NOTE — Progress Notes (Signed)
Cardiology Office Note:    Date:  03/18/2022   ID:  Nicole Morris, DOB [birth year], MRN [id number]  PCP:  Harlan Stains, MD  [facility] Cardiologist:  Sanda Klein, MD  [facility] Electrophysiologist:  None   Referring MD: Harlan Stains, MD   Chief Complaint  Patient presents with   Pre-op Exam    History of Present Illness:    Nicole Morris is a 61 y.o. 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 Joelyn Oms),  treated hypothyroidism, hypercholesterolemia on statin, history of left total hip replacement and limbs of unequal length, history of unexplained syncope, now presenting for evaluation prior to oral surgery.  She had a weeklong migraine during a trip to the [place] and then noticed swelling on one side of her face that progressed to a full facial abscess/Ludwig's angina requiring hospitalization in the intensive care unit.  She improved with antibiotics, the source of the swelling was deemed to be a lower right molar and she was discharged from the hospital on July 6 ..  Is planning to undergo extraction of 4 teeth from her right mandible with Dr. Alveta Heimlich at the [facility].  He requested that she have a cardiac evaluation before this.  Incidental note was made of a 4.2 cm of the ascending aorta on a CT scan of the head and neck.  Although this was not reported on a previous chest CT performed 2021, I went back and looked at the images.  There was an ascending aortic aneurysm there as well, measuring an identical 4.2 cm in maximum diameter.  She has not had syncope since implantation of her loop recorder.  A couple of near syncopal events which she marked with the remote activator showed gradual sinus tachycardia suggesting may be an aborted episode of neurocardiogenic  syncope.  The device has not recorded any true arrhythmia.  She does not have palpitations and denies shortness of breath or chest pain with activity.  She denies lower extremity edema and intermittent claudication.  She denies orthopnea, PND, leg edema, focal neurological complaints.  She has had 3 separate back surgeries for scoliosis and has a lot of hardware in her thoracic and lumbar spine.  She has had a total of 23 surgeries including multiple procedures for congenital ureteral drainage problems and need for bladder augmentation.  The last surgery was performed in 1988.  She has a neurogenic bladder and requires self-catheterization.  She has been on treatment with rosuvastatin for hypercholesterolemia.  She bears a diagnosis of CKD stage III.  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  Both her father Nicole Morris and her mother Nicole Morris have been my patients in the past but have since passed away, both within the last 2 or 3 years.  Past Medical History:  Diagnosis Date   Anxiety    Asthma    Chronic lower back pain    Dysrhythmia    pt got Tachy last time under anesthesia   High cholesterol    History of blood transfusion    "related to surgeries" (06/14/2018)   Hypothyroidism    Kidney disease, chronic, stage II (GFR 60-89 ml/min)    "one of my kidneys is in my abdomen" (06/14/2018)   Migraine    "  daily-weekly" (06/14/2018)   Neuromuscular disorder (HCC)    neuropathy in legs from back surgery   PONV (postoperative nausea and vomiting)    Scoliosis     Past Surgical History:  Procedure Laterality Date   [place]     JOINT REPLACEMENT     KNEE ARTHROSCOPY Right    SPINAL FUSION  2008   "w/harrington rods, etc"   TOTAL HIP ARTHROPLASTY Left 06/14/2018    TOTAL HIP ARTHROPLASTY Left 06/14/2018   Procedure: LEFT TOTAL HIP ARTHROPLASTY ANTERIOR APPROACH;  Surgeon: Mcarthur Rossetti, MD;  Location: [place];  Service: Orthopedics;  Laterality: Left;    Current Medications: Current Meds  Medication Sig   albuterol (PROVENTIL HFA;VENTOLIN HFA) 108 (90 BASE) MCG/ACT inhaler Inhale 2 puffs into the lungs every 6 (six) hours as needed for wheezing or shortness of breath.   amoxicillin-clavulanate (AUGMENTIN) 875-125 MG tablet Take 1 tablet by mouth 2 (two) times daily for 8 days.   buPROPion (WELLBUTRIN XL) 300 MG 24 hr tablet Take 300 mg by mouth daily.    butalbital-acetaminophen-caffeine (FIORICET, ESGIC) 50-325-40 MG tablet Take 1 tablet by mouth 3 (three) times daily as needed for headache or migraine.   chlorhexidine (PERIDEX) 0.12 % solution Use as directed 15 mLs in the mouth or throat 2 (two) times daily.   diclofenac sodium (VOLTAREN) 1 % GEL Apply 2 g topically daily as needed (joint pain).    levothyroxine (SYNTHROID, LEVOTHROID) 50 MCG tablet Take 50 mcg by mouth daily before breakfast.   ondansetron (ZOFRAN) 4 MG tablet Take 1 tablet (4 mg total) by mouth as needed for nausea or vomiting. (Patient taking differently: Take 4 mg by mouth every 8 (eight) hours as needed for nausea or vomiting.)   rizatriptan (MAXALT-MLT) 10 MG disintegrating tablet Take 1 tablet (10 mg total) by mouth as needed for migraine. May repeat in 2 hours if needed (Patient taking differently: Take 10 mg by mouth daily as needed for migraine. May repeat in 2 hours if needed)   rosuvastatin (CRESTOR) 5 MG tablet Take 5 mg by mouth daily.    sertraline (ZOLOFT) 100 MG tablet Take 100 mg by mouth every evening.    tapentadol (NUCYNTA) 50 MG 12 hr tablet Take 50 mg by mouth every 12 (twelve) hours.   tiZANidine (ZANAFLEX) 2 MG tablet Take 1 tablet (2 mg total) by mouth every 8 (eight) hours as needed for muscle spasms.     Allergies:   Atorvastatin, Meperidine, Other,  Codeine, Metoclopramide, and Mushroom extract complex   Social History   Socioeconomic History   Marital status: Married    Spouse name: Not on file   Number of children: 2   Years of education: college   Highest education level: Not on file  Occupational History   Occupation: Advertising account planner  Tobacco Use   Smoking status: Never   Smokeless tobacco: Never  Vaping Use   Vaping Use: Never used  Substance and Sexual Activity   Alcohol use: Yes    Comment: rare   Drug use: Never   Sexual activity: Not Currently  Other Topics Concern   Not on file  Social History Narrative   Lives at home with husband.   Right-handed.   2-3 cups coffee per day.   Social Determinants of Health   Financial Resource Strain: Not on file  Food Insecurity: Not on file  Transportation Needs: Not on file  Physical Activity: Not on file  Stress: Not on file  Social Connections: Not on file     Family History: The patient's father had atrial fibrillation and passed away from metastatic liver cancer.  Her mother had interstitial lung disease, lung cancer and suspected coronary disease. ROS:   Please see the history of present illness.    All other systems are reviewed and are negative.   EKGs/Labs/Other Studies Reviewed:    The following studies were reviewed today: Loop recorder download performed recently shows normal rhythm.  EKG:  EKG is not ordered today.  Personally reviewed the tracing from 03/13/2022 which shows normal sinus rhythm and an RSR prime pattern in lead V1 otherwise completely normal.  Similar to previous tracings.  Recent Labs: 03/15/2022: BUN 20; Creatinine, Ser 1.02; Hemoglobin 9.7; Magnesium 2.0; Platelets 171; Potassium 4.4; Sodium 142  10/17/2020 Hemoglobin 12.4, creatinine 1.19, potassium 4.3, TSH 1.87, normal liver function tests Recent Lipid Panel No results found for: "CHOL", "TRIG", "HDL", "CHOLHDL", "VLDL", "LDLCALC", "LDLDIRECT" 10/17/2020 total cholesterol  199, HDL 65, LDL 106, triglycerides 99 Physical Exam:    VS:  BP 136/90 (BP Location: Left Arm, Patient Position: Sitting, Cuff Size: Normal)   Pulse 80   Ht 5' 2.5" (1.588 m)   Wt 130 lb 6.4 oz (59.1 kg)   SpO2 97%   BMI 23.47 kg/m     Wt Readings from Last 3 Encounters:  03/18/22 130 lb 6.4 oz (59.1 kg)  03/12/22 132 lb 0.9 oz (59.9 kg)  10/02/21 137 lb (62.1 kg)      General: Alert, oriented x3, no distress, prominent scoliosis Head: no evidence of trauma, PERRL, EOMI, no exophtalmos or lid lag, no myxedema, no xanthelasma; normal ears, nose and oropharynx Neck: normal jugular venous pulsations and no hepatojugular reflux; brisk carotid pulses without delay and no carotid bruits Chest: clear to auscultation, no signs of consolidation by percussion or palpation, normal fremitus, symmetrical and full respiratory excursions Cardiovascular: normal position and quality of the apical impulse, regular rhythm, normal first and second heart sounds, no murmurs, rubs or gallops Abdomen: no tenderness or distention, no masses by palpation, no abnormal pulsatility or arterial bruits, normal bowel sounds, no hepatosplenomegaly Extremities: no clubbing, cyanosis or edema; 2+ radial, ulnar and brachial pulses bilaterally; 2+ right femoral, posterior tibial and dorsalis pedis pulses; 2+ left femoral, posterior tibial and dorsalis pedis pulses; no subclavian or femoral bruits Neurological: grossly nonfocal, walks with a limp. Psych: Normal mood and affect   ASSESSMENT:    1. Preoperative cardiovascular examination   2. Aneurysm of ascending aorta without rupture (HCC)   3. Vasovagal syncope   4. Hypercholesterolemia     PLAN:    In order of problems listed above:  Preop CV exam: Does not have any evidence of serious cardiac illness.  Her ascending aortic aneurysm is small and stable in size, unlikely to be an issue for many years to come.  She is at low risk for the planned dental surgery.    Asc Ao Aneurysm: Stable in size on studies performed 2 years apart.  There is no family history of aortic aneurysm or dissection or sudden unexplained death that she is aware of.  She has normal blood pressure.  Since she has a history of abnormal renal function, we will plan to follow this every other year rather than yearly. Syncope: Previous events were strongly suggestive of vasovagal mechanism and this is supported by some of the  tracings we have seen on her loop recorder, although she has not had repeat syncope since also suggests an aborted episode of neurally mediated syncope.  No true arrhythmia has been detected.  Continue to recommended a relatively high sodium diet, aggressive hydration, avoiding triggers (if she can identify them) and (very importantly) always paying heed immediately to the prodrome and laying down to avoid full-blown syncope. HLP: Ideally would bring her LDL cholesterol down to less than 100 (she does not have established vascular disease or significant coronary calcification) and on recently performed head and neck CT as well as on previous CT of the chest abdomen and pelvis there is virtually no visible atherosclerotic calcification in the coronaries, aorta or any of the visible branches except for a tiny speck in the left subclavian artery. CKD: Recent normal creatinine at 1.02, corresponding to a GFR greater than 60.  Known congenital abnormalities of the urinary system with multiple surgical procedures and neurogenic bladder requiring self-catheterization.  Seems to have stable renal function for several years.  Nevertheless, would avoid nephrotoxic agents including unnecessary contrast based procedures.   Medication Adjustments/Labs and Tests Ordered: Current medicines are reviewed at length with the patient today.  Concerns regarding medicines are outlined above.  No orders of the defined types were placed in this encounter.  No orders of the defined types were  placed in this encounter.   Patient Instructions  Medication Instructions:  No changes *If you need a refill on your cardiac medications before your next appointment, please call your pharmacy*   Lab Work: None ordered If you have labs (blood work) drawn today and your tests are completely normal, you will receive your results only by: MyChart Message (if you have MyChart) OR A paper copy in the mail If you have any lab test that is abnormal or we need to change your treatment, we will call you to review the results.   Testing/Procedures: None ordered   Follow-Up: At [facility], you and your health needs are our priority.  As part of our continuing mission to provide you with exceptional heart care, we have created designated Provider Care Teams.  These Care Teams include your primary Cardiologist (physician) and Advanced Practice Providers (APPs -  Physician Assistants and Nurse Practitioners) who all work together to provide you with the care you need, when you need it.  We recommend signing up for the patient portal called "MyChart".  Sign up information is provided on this After Visit Summary.  MyChart is used to connect with patients for Virtual Visits (Telemedicine).  Patients are able to view lab/test results, encounter notes, upcoming appointments, etc.  Non-urgent messages can be sent to your provider as well.   To learn more about what you can do with MyChart, go to [email or web address].    Your next appointment:   12 month(s)  The format for your next appointment:   In Person  Provider:   Thurmon Fair, MD {   Important Information About Sugar         Signed, Thurmon Fair, MD  03/18/2022 2:05 PM    [facility]

## 2022-03-18 NOTE — Patient Instructions (Signed)

## 2022-03-20 ENCOUNTER — Telehealth: Payer: Self-pay | Admitting: *Deleted

## 2022-03-20 NOTE — Telephone Encounter (Signed)
Pre op clearance letter has been faxed to [phone], [facility]   Letter in epic

## 2022-03-24 ENCOUNTER — Other Ambulatory Visit: Payer: Self-pay

## 2022-03-24 ENCOUNTER — Encounter: Payer: Self-pay | Admitting: Physician Assistant

## 2022-03-24 ENCOUNTER — Telehealth: Payer: Self-pay | Admitting: Physician Assistant

## 2022-03-24 ENCOUNTER — Inpatient Hospital Stay: Payer: No Typology Code available for payment source | Attending: Physician Assistant | Admitting: Physician Assistant

## 2022-03-24 ENCOUNTER — Inpatient Hospital Stay: Payer: No Typology Code available for payment source

## 2022-03-24 VITALS — BP 143/95 | HR 77 | Temp 97.7°F | Resp 15 | Wt 129.8 lb

## 2022-03-24 DIAGNOSIS — R791 Abnormal coagulation profile: Secondary | ICD-10-CM | POA: Diagnosis not present

## 2022-03-24 DIAGNOSIS — E785 Hyperlipidemia, unspecified: Secondary | ICD-10-CM | POA: Insufficient documentation

## 2022-03-24 DIAGNOSIS — E039 Hypothyroidism, unspecified: Secondary | ICD-10-CM | POA: Insufficient documentation

## 2022-03-24 DIAGNOSIS — N182 Chronic kidney disease, stage 2 (mild): Secondary | ICD-10-CM | POA: Diagnosis not present

## 2022-03-24 DIAGNOSIS — G8929 Other chronic pain: Secondary | ICD-10-CM | POA: Diagnosis not present

## 2022-03-24 DIAGNOSIS — Z79899 Other long term (current) drug therapy: Secondary | ICD-10-CM | POA: Diagnosis not present

## 2022-03-24 DIAGNOSIS — E7800 Pure hypercholesterolemia, unspecified: Secondary | ICD-10-CM | POA: Diagnosis not present

## 2022-03-24 DIAGNOSIS — D689 Coagulation defect, unspecified: Secondary | ICD-10-CM | POA: Insufficient documentation

## 2022-03-24 DIAGNOSIS — M419 Scoliosis, unspecified: Secondary | ICD-10-CM | POA: Diagnosis not present

## 2022-03-24 DIAGNOSIS — Z800 Family history of malignant neoplasm of digestive organs: Secondary | ICD-10-CM | POA: Insufficient documentation

## 2022-03-24 DIAGNOSIS — D649 Anemia, unspecified: Secondary | ICD-10-CM

## 2022-03-24 DIAGNOSIS — Z79890 Hormone replacement therapy (postmenopausal): Secondary | ICD-10-CM | POA: Diagnosis not present

## 2022-03-24 DIAGNOSIS — Z801 Family history of malignant neoplasm of trachea, bronchus and lung: Secondary | ICD-10-CM | POA: Insufficient documentation

## 2022-03-24 DIAGNOSIS — J45909 Unspecified asthma, uncomplicated: Secondary | ICD-10-CM | POA: Insufficient documentation

## 2022-03-24 LAB — CBC WITH DIFFERENTIAL (CANCER CENTER ONLY)
Abs Immature Granulocytes: 0.03 10*3/uL (ref 0.00–0.07)
Basophils Absolute: 0 10*3/uL (ref 0.0–0.1)
Basophils Relative: 0 %
Eosinophils Absolute: 0.1 10*3/uL (ref 0.0–0.5)
Eosinophils Relative: 2 %
HCT: 34.6 % — ABNORMAL LOW (ref 36.0–46.0)
Hemoglobin: 11.7 g/dL — ABNORMAL LOW (ref 12.0–15.0)
Immature Granulocytes: 1 %
Lymphocytes Relative: 37 %
Lymphs Abs: 1.7 10*3/uL (ref 0.7–4.0)
MCH: 31.5 pg (ref 26.0–34.0)
MCHC: 33.8 g/dL (ref 30.0–36.0)
MCV: 93.3 fL (ref 80.0–100.0)
Monocytes Absolute: 0.3 10*3/uL (ref 0.1–1.0)
Monocytes Relative: 7 %
Neutro Abs: 2.6 10*3/uL (ref 1.7–7.7)
Neutrophils Relative %: 53 %
Platelet Count: 236 10*3/uL (ref 150–400)
RBC: 3.71 MIL/uL — ABNORMAL LOW (ref 3.87–5.11)
RDW: 12.7 % (ref 11.5–15.5)
WBC Count: 4.8 10*3/uL (ref 4.0–10.5)
nRBC: 0 % (ref 0.0–0.2)

## 2022-03-24 LAB — CMP (CANCER CENTER ONLY)
ALT: 18 U/L (ref 0–44)
AST: 18 U/L (ref 15–41)
Albumin: 4.4 g/dL (ref 3.5–5.0)
Alkaline Phosphatase: 89 U/L (ref 38–126)
Anion gap: 7 (ref 5–15)
BUN: 16 mg/dL (ref 8–23)
CO2: 25 mmol/L (ref 22–32)
Calcium: 9 mg/dL (ref 8.9–10.3)
Chloride: 105 mmol/L (ref 98–111)
Creatinine: 1.11 mg/dL — ABNORMAL HIGH (ref 0.44–1.00)
GFR, Estimated: 57 mL/min — ABNORMAL LOW (ref >60)
Glucose, Bld: 93 mg/dL (ref 70–99)
Potassium: 4 mmol/L (ref 3.5–5.1)
Sodium: 137 mmol/L (ref 135–145)
Total Bilirubin: 0.4 mg/dL (ref 0.3–1.2)
Total Protein: 7.6 g/dL (ref 6.5–8.1)

## 2022-03-24 LAB — RETIC PANEL
Immature Retic Fract: 9 % (ref 2.3–15.9)
RBC.: 3.64 MIL/uL — ABNORMAL LOW (ref 3.87–5.11)
Retic Count, Absolute: 55 10*3/uL (ref 19.0–186.0)
Retic Ct Pct: 1.5 % (ref 0.4–3.1)
Reticulocyte Hemoglobin: 35.4 pg (ref >27.9)

## 2022-03-24 LAB — LACTATE DEHYDROGENASE: LDH: 155 U/L (ref 98–192)

## 2022-03-24 LAB — FOLATE: Folate: 11.2 ng/mL (ref >5.9)

## 2022-03-24 LAB — PROTIME-INR
INR: 1 (ref 0.8–1.2)
Prothrombin Time: 12.5 seconds (ref 11.4–15.2)

## 2022-03-24 LAB — APTT: aPTT: 27 seconds (ref 24–36)

## 2022-03-24 LAB — VITAMIN B12: Vitamin B-12: 127 pg/mL — ABNORMAL LOW (ref 180–914)

## 2022-03-24 NOTE — Telephone Encounter (Signed)
Scheduled appt per 7/17 referral. Pt is aware of appt date and time. Pt is aware to arrive 15 mins prior to appt time and to bring and updated insurance card. Pt is aware of appt location.   

## 2022-03-25 LAB — VON WILLEBRAND PANEL
Coagulation Factor VIII: 103 % (ref 56–140)
Ristocetin Co-factor, Plasma: 54 % (ref 50–200)
Von Willebrand Antigen, Plasma: 92 % (ref 50–200)

## 2022-03-25 LAB — COAG STUDIES INTERP REPORT

## 2022-03-25 LAB — HAPTOGLOBIN: Haptoglobin: 176 mg/dL (ref 37–355)

## 2022-03-26 NOTE — Progress Notes (Signed)
[facility] [phone]   [phone]  INITIAL CONSULT NOTE  Patient Care Team: Laurann Montana, MD as PCP - General (Family Medicine) Croitoru, Rachelle Hora, MD as PCP - Cardiology (Cardiology)  CHIEF COMPLAINTS/PURPOSE OF CONSULTATION:  Evaluate for bleeding disorder and clearance for upcoming dental surgery.  HISTORY OF PRESENTING ILLNESS:  Nicole Morris 61 y.o. female with medical history significant for anxiety, asthma, hyperlipidemia, CKD stage II, hypothyroidism and scoliosis. She presents to the hematology clinic for evaluation of possible bleeding disorder. She is unaccompanied for this visit.   On review of the previous records, patient underwent laboratory evaluation with her PCP on 03/19/2022 that showed normal PT/INR, PTT levels but decreased vWF activity measuring 42%.   On exam today, Ms. Wessells reports that she does easily bruise mainly int he arms and legs. She denies any active bleeding except for an occasional nose bleed that is due to the weather or allergies. She adds that as a child, she remembers having numerous surgeries and she had a swish a drink before the surgeries. As an adult, she underwent surgeries that required blood transfusions due to anemia.   Otherwese, she is feeling well. She denies any fatigue and she is able to complete her ADLs on her own. She denies any appetite or weight changes. She denies nausea, vomiting or abdominal pain. Her bowel habits are unchanged without any recurrent episodes of diarrhea or constipation. She denies fevers, chills, sweats, shortness of breath, chest pain or cough. She has no other complaints. Rest of the 10 point ROS is below.   MEDICAL HISTORY:  Past Medical History:  Diagnosis Date   Anxiety    Asthma    Chronic lower back pain    Dysrhythmia    pt got Tachy last time under anesthesia   High cholesterol    History of blood transfusion    "related to surgeries" (06/14/2018)    Hypothyroidism    Kidney disease, chronic, stage II (GFR 60-89 ml/min)    "one of my kidneys is in my abdomen" (06/14/2018)   Migraine    "daily-weekly" (06/14/2018)   Neuromuscular disorder (HCC)    neuropathy in legs from back surgery   PONV (postoperative nausea and vomiting)    Scoliosis     SURGICAL HISTORY: Past Surgical History:  Procedure Laterality Date   ABDOMINAL HYSTERECTOMY  1988   BACK SURGERY     BLADDER AUGMENTATION  1988   HERNIA REPAIR     JOINT REPLACEMENT     KNEE ARTHROSCOPY Right    SPINAL FUSION  2008   "w/harrington rods, etc"   TOTAL HIP ARTHROPLASTY Left 06/14/2018   TOTAL HIP ARTHROPLASTY Left 06/14/2018   Procedure: LEFT TOTAL HIP ARTHROPLASTY ANTERIOR APPROACH;  Surgeon: Kathryne Hitch, MD;  Location: [facility] OR;  Service: Orthopedics;  Laterality: Left;    SOCIAL HISTORY: Social History   Socioeconomic History   Marital status: Married    Spouse name: Not on file   Number of children: 2   Years of education: college   Highest education level: Not on file  Occupational History   Occupation: Public librarian  Tobacco Use   Smoking status: Never   Smokeless tobacco: Never  Vaping Use   Vaping Use: Never used  Substance and Sexual Activity   Alcohol use: Yes    Comment: rare   Drug use: Never   Sexual activity: Not Currently  Other Topics Concern   Not on file  Social History Narrative  Lives at home with husband.   Right-handed.   2-3 cups coffee per day.   Social Determinants of Health   Financial Resource Strain: Not on file  Food Insecurity: Not on file  Transportation Needs: Not on file  Physical Activity: Not on file  Stress: Not on file  Social Connections: Not on file  Intimate Partner Violence: Not on file    FAMILY HISTORY: Family History  Problem Relation Age of Onset   Lung cancer Mother        Hx of ILD   Liver cancer Father     ALLERGIES:  is allergic to atorvastatin, meperidine, other, codeine,  metoclopramide, and mushroom extract complex.  MEDICATIONS:  Current Outpatient Medications  Medication Sig Dispense Refill   albuterol (PROVENTIL HFA;VENTOLIN HFA) 108 (90 BASE) MCG/ACT inhaler Inhale 2 puffs into the lungs every 6 (six) hours as needed for wheezing or shortness of breath.     buPROPion (WELLBUTRIN XL) 300 MG 24 hr tablet Take 300 mg by mouth daily.      butalbital-acetaminophen-caffeine (FIORICET, ESGIC) 50-325-40 MG tablet Take 1 tablet by mouth 3 (three) times daily as needed for headache or migraine.     chlorhexidine (PERIDEX) 0.12 % solution Use as directed 15 mLs in the mouth or throat 2 (two) times daily. 473 mL 0   diclofenac sodium (VOLTAREN) 1 % GEL Apply 2 g topically daily as needed (joint pain).      hydrocortisone 2.5 % cream Apply 1 Application topically daily as needed (itching).     levothyroxine (SYNTHROID, LEVOTHROID) 50 MCG tablet Take 50 mcg by mouth daily before breakfast.     LORazepam (ATIVAN) 0.5 MG tablet Take 0.25-0.5 mg by mouth every 8 (eight) hours as needed for anxiety.     ondansetron (ZOFRAN) 4 MG tablet Take 1 tablet (4 mg total) by mouth as needed for nausea or vomiting. (Patient taking differently: Take 4 mg by mouth every 8 (eight) hours as needed for nausea or vomiting.) 15 tablet 3   rizatriptan (MAXALT-MLT) 10 MG disintegrating tablet Take 1 tablet (10 mg total) by mouth as needed for migraine. May repeat in 2 hours if needed (Patient taking differently: Take 10 mg by mouth daily as needed for migraine. May repeat in 2 hours if needed) 10 tablet 6   rosuvastatin (CRESTOR) 5 MG tablet Take 5 mg by mouth daily.      sertraline (ZOLOFT) 100 MG tablet Take 100 mg by mouth every evening.      tiZANidine (ZANAFLEX) 2 MG tablet Take 1 tablet (2 mg total) by mouth every 8 (eight) hours as needed for muscle spasms. 30 tablet 3   triamcinolone (NASACORT) 55 MCG/ACT AERO nasal inhaler Place 1 spray into the nose daily as needed (congestion).      tapentadol (NUCYNTA) 50 MG 12 hr tablet Take 50 mg by mouth every 12 (twelve) hours. (Patient not taking: Reported on 03/24/2022)     No current facility-administered medications for this visit.    REVIEW OF SYSTEMS:   Constitutional: ( - ) fevers, ( - )  chills , ( - ) night sweats Eyes: ( - ) blurriness of vision, ( - ) double vision, ( - ) watery eyes Ears, nose, mouth, throat, and face: ( - ) mucositis, ( - ) sore throat Respiratory: ( - ) cough, ( - ) dyspnea, ( - ) wheezes Cardiovascular: ( - ) palpitation, ( - ) chest discomfort, ( - ) lower extremity swelling Gastrointestinal:  ( - )  nausea, ( - ) heartburn, ( - ) change in bowel habits Skin: ( - ) abnormal skin rashes Lymphatics: ( - ) new lymphadenopathy, ( + ) easy bruising Neurological: ( - ) numbness, ( - ) tingling, ( - ) new weaknesses Behavioral/Psych: ( - ) mood change, ( - ) new changes  All other systems were reviewed with the patient and are negative.  PHYSICAL EXAMINATION: ECOG PERFORMANCE STATUS: 1 - Symptomatic but completely ambulatory  Vitals:   03/24/22 1135  BP: (!) 143/95  Pulse: 77  Resp: 15  Temp: 97.7 F (36.5 C)  SpO2: 100%   Filed Weights   03/24/22 1135  Weight: 129 lb 12.8 oz (58.9 kg)    GENERAL: well appearing female in NAD  SKIN: skin color, texture, turgor are normal, no rashes or significant lesions EYES: conjunctiva are pink and non-injected, sclera clear OROPHARYNX: no exudate, no erythema; lips, buccal mucosa, and tongue normal  NECK: supple, non-tender LYMPH:  no palpable lymphadenopathy in the cervical or supraclavicular lymph nodes.  LUNGS: clear to auscultation and percussion with normal breathing effort HEART: regular rate & rhythm and no murmurs and no lower extremity edema ABDOMEN: soft, non-tender, non-distended, normal bowel sounds Musculoskeletal: no cyanosis of digits and no clubbing  PSYCH: alert & oriented x 3, fluent speech NEURO: no focal motor/sensory  deficits  LABORATORY DATA:  I have reviewed the data as listed    Latest Ref Rng & Units 03/24/2022   12:46 PM 03/15/2022    3:11 AM 03/14/2022    3:17 AM  CBC  WBC 4.0 - 10.5 K/uL 4.8  4.3  7.7   Hemoglobin 12.0 - 15.0 g/dL 11.7  9.7  10.7   Hematocrit 36.0 - 46.0 % 34.6  29.9  33.6   Platelets 150 - 400 K/uL 236  171  185        Latest Ref Rng & Units 03/24/2022   12:46 PM 03/15/2022    3:11 AM 03/14/2022    3:17 AM  CMP  Glucose 70 - 99 mg/dL 93  123  97   BUN 8 - 23 mg/dL 16  20  20    Creatinine 0.44 - 1.00 mg/dL 1.11  1.02  1.04   Sodium 135 - 145 mmol/L 137  142  142   Potassium 3.5 - 5.1 mmol/L 4.0  4.4  4.5   Chloride 98 - 111 mmol/L 105  115  114   CO2 22 - 32 mmol/L 25  22  20    Calcium 8.9 - 10.3 mg/dL 9.0  8.5  8.6   Total Protein 6.5 - 8.1 g/dL 7.6     Total Bilirubin 0.3 - 1.2 mg/dL 0.4     Alkaline Phos 38 - 126 U/L 89     AST 15 - 41 U/L 18     ALT 0 - 44 U/L 18         RADIOGRAPHIC STUDIES: I have personally reviewed the radiological images as listed and agreed with the findings in the report. CUP PACEART REMOTE DEVICE CHECK  Result Date: 03/16/2022 ILR summary report received. Battery status OK. Normal device function. No new symptom, tachy, brady, or pause episodes. No new AF episodes. Monthly summary reports and ROV/PRN LA  CT Soft Tissue Neck W Contrast  Result Date: 03/12/2022 CLINICAL DATA:  Right lower facial soft tissue swelling. EXAM: CT NECK WITH CONTRAST TECHNIQUE: Multidetector CT imaging of the neck was performed using the standard protocol following the bolus administration of  intravenous contrast. RADIATION DOSE REDUCTION: This exam was performed according to the departmental dose-optimization program which includes automated exposure control, adjustment of the mA and/or kV according to patient size and/or use of iterative reconstruction technique. CONTRAST:  22mL OMNIPAQUE IOHEXOL 300 MG/ML  SOLN COMPARISON:  Chest CT 01/15/2020. FINDINGS: Pharynx  and larynx: Mild motion artifact. No mass. Widely patent airway. No retropharyngeal fluid collection. Salivary glands: Soft tissue swelling and inflammatory changes in the right submandibular space and right floor of mouth with some extension across the midline in the submental region. No organized fluid collection. Normal appearance of the submandibular glands themselves as well as of the parotid glands. No salivary stone or mass. Thyroid: Unremarkable. Lymph nodes: Asymmetrically enlarged right level IB and II lymph nodes which remain subcentimeter in short axis and likely reactive. Vascular: Major vascular structures of the neck are grossly patent. Partially visualized aneurysmal dilatation of the ascending aorta with 4.2 cm maximal diameter, unchanged from the prior chest CT. Limited intracranial: Unremarkable. Visualized orbits: Not imaged. Mastoids and visualized paranasal sinuses: Clear. Skeleton: Multiple dental caries. Periapical lucencies most notably involving the left-sided mandibular canines with disruption of the overlying buccal cortex of the mandible. The lone remaining right mandibular molar tooth contains a cavity and demonstrates mild periapical lucency with disruption of the lingual cortex of the mandible, and the above described soft tissue inflammation may be originating from this location. Upper chest: Clear lung apices. Other: None. IMPRESSION: 1. Soft tissue swelling in the right submandibular region and floor of mouth suggesting cellulitis, possibly dental infection arising from the lone remaining right mandibular molar tooth. No abscess. 2. 4.2 cm ascending aortic aneurysm. Recommend annual imaging followup by CTA or MRA. This recommendation follows 2010 ACCF/AHA/AATS/ACR/ASA/SCA/SCAI/SIR/STS/SVM Guidelines for the Diagnosis and Management of Patients with Thoracic Aortic Disease. Circulation. 2010; 121: P619-J093. Aortic aneurysm NOS (ICD10-I71.9) Electronically Signed   By: Sebastian Ache  M.D.   On: 03/12/2022 18:21    ASSESSMENT & PLAN Mayrani Khamis is a 60 y.o. female who presents to the clinic for evaluation of possible bleeding disorder and obtain clearance for upcoming dental procedure.   #Decreased vonWillebrand activity: --Repeat labs today to check CBC, CMP, PT/INR, PTT and vWB --As she undergoes workup, we can provide prescription for tranexamic acid solution to reduces risk of bleeding for her upcoming dental procedure.   #Normocytic anemia: --Outside labs from 03/18/2022 showed Hgb 10.6, MCV 91.9.  --will evaluate for nutritional deficiencies and hemolysis today.  Orders Placed This Encounter  Procedures   CBC with Differential (Cancer Center Only)    Standing Status:   Future    Number of Occurrences:   1    Standing Expiration Date:   03/25/2023   CMP (Cancer Center only)    Standing Status:   Future    Number of Occurrences:   1    Standing Expiration Date:   03/25/2023   Lactate dehydrogenase (LDH)    Standing Status:   Future    Number of Occurrences:   1    Standing Expiration Date:   03/24/2023   Retic Panel    Standing Status:   Future    Number of Occurrences:   1    Standing Expiration Date:   03/25/2023   Haptoglobin    Standing Status:   Future    Number of Occurrences:   1    Standing Expiration Date:   03/24/2023   Vitamin B12    Standing Status:   Future  Number of Occurrences:   1    Standing Expiration Date:   03/24/2023   Methylmalonic acid, serum    Standing Status:   Future    Number of Occurrences:   1    Standing Expiration Date:   03/24/2023   Folate, Serum    Standing Status:   Future    Number of Occurrences:   1    Standing Expiration Date:   03/24/2023   Protime-INR    Standing Status:   Future    Number of Occurrences:   1    Standing Expiration Date:   03/25/2023   APTT    Standing Status:   Future    Number of Occurrences:   1    Standing Expiration Date:   03/24/2023   Von Willebrand panel    Standing Status:    Future    Number of Occurrences:   1    Standing Expiration Date:   03/24/2023    All questions were answered. The patient knows to call the clinic with any problems, questions or concerns.  I have spent a total of 60 minutes minutes of face-to-face and non-face-to-face time, preparing to see the patient, obtaining and/or reviewing separately obtained history, performing a medically appropriate examination, counseling and educating the patient, ordering tests/procedures,  documenting clinical information in the electronic health record, and care coordination.   Dede Query, PA-C Department of Hematology/Oncology [place] [facility] Phone: [phone]  Patient was seen with Dr. Lorenso Courier  I have read the above note and personally examined the patient. I agree with the assessment and plan as noted above.  Briefly Mrs. Sligh is a 61 year old female who presents for evaluation of a bleeding disorder.  There is concern that she had a previously low von Willebrand activity level.  At this time I would recommend we order a repeat von Willebrand panel.  I have low suspicion for a bleeding disorder and would recommend that she move forward with scheduling her dental work.  In the event there are any concerning findings on her blood work today we could make recommendations for preventative measures.  Also at this time she appears to have anemia of unclear etiology.  We will order full nutritional panel in order to evaluate this more fully.  The patient voiced understanding of the plan moving forward.   Ledell Peoples, MD Department of Hematology/Oncology [place] at [facility] Phone: [phone] Pager: [phone] Email: [email or web address]

## 2022-03-27 LAB — METHYLMALONIC ACID, SERUM: Methylmalonic Acid, Quantitative: 1400 nmol/L — ABNORMAL HIGH (ref 0–378)

## 2022-03-30 ENCOUNTER — Telehealth: Payer: Self-pay | Admitting: Physician Assistant

## 2022-03-30 MED ORDER — B-12 1000 MCG SL SUBL: 1000.0000 ug | SUBLINGUAL_TABLET | Freq: Every day | SUBLINGUAL | 3 refills | Status: AC

## 2022-03-30 MED ORDER — TRANEXAMIC ACID 650 MG PO TABS: ORAL_TABLET | ORAL | 0 refills | Status: DC

## 2022-03-30 NOTE — Telephone Encounter (Signed)
I spoke to Ms. Nicole Morris to review lab results from 03/24/2022. Findings show no evidence of von willebrand disorder. Additionally, PT/INR and PTT levels were normal. There is no evidence to suggest bleeding disorder so no further recommendation is needed. We sent a prescription for tranexamic acid mouthwash to hemostatic effect if she develops bleeding after the dental procedure. We will call patient after her dental procedure to monitor for any signs of bleeding.   Additionally, she has B12 deficiency so we sent sublingual B12 supplementation. We will see patient back in 3 months with repeat labs.

## 2022-03-31 ENCOUNTER — Telehealth: Payer: Self-pay | Admitting: Physician Assistant

## 2022-03-31 NOTE — Telephone Encounter (Signed)
Scheduled per 7/24 secure chat, pt has been called and confirmed  

## 2022-04-01 ENCOUNTER — Encounter: Payer: Self-pay | Admitting: Physician Assistant

## 2022-04-01 NOTE — Progress Notes (Unsigned)
PATIENT: Nicole Morris DOB: 1961/06/20  REASON FOR VISIT: Follow up for migraine headache  HISTORY FROM: Patient PRIMARY NEUROLOGIST: Dr. Terrace Arabia   HISTORY Nicole Morris is a 61 year old female, seen in request by her primary care physician Dr. Laurann Montana, for evaluation of frequent headaches, initial evaluation was on 09-30-2020   I reviewed and summarized the referring note. PMHX. Hypothyroidism, on supplement Hyperlipidemia, on Crestor 5 mg every night Anxiety, depression, taking Zoloft 100 mg daily, Wellbutrin 300 mg daily,: History of spinal bifida, scoliosis, left hip replacement, lumbar decompression surgery, pain management by Dr. Thyra Breed, taking Nucynta help 50 mg daily,   She reported a history of migraine headaches since childhood, gradually getting worse, since 2019, she began to have daily headaches, usually starting from her neck, spreading forward to become occipital area sometimes bilateral frontal behind eye pressure headaches, she complains of 6 out of 10 daily pressure headache, for that reason, over the past 2 years, she has been taking Fioricet every day,  about once a week, she would get more severe headache with light noise sensitivity, nauseous, movement made her headache worse, sometimes preceded by visual auras, lasting for hours, sleep usually helps,   She never tried triptan treatment in the past, is not taking preventive medications, denies a history of stroke and coronary artery disease   She had a history of spina bifid, had extensive back surgery in the past, also had a history of bilateral hip replacement,   She also reported 1 episode of unexplained passing out episode on Jan 15, 2020, there was no warning, she passed out from a standing position,   She had extensive bladder surgery since childhood, relied on self cath since 1988, she denies bowel incontinence, no gait abnormalities.   Laboratory evaluation in May 2021, CPK 146, negative  troponin, CMP, creatinine 1.12, CBC, with hemoglobin of 9.4    UPDATE July 26th 2022: She reported at least 50% improvement after starting Ajovy, her headache is less severe, less frequent, no significant side effect noted  She did try Imitrex 50 mg as needed, which takes away her headache fairly effectively, but she complains of extreme fatigue, heaviness sensation of the back  She complains of chronic stress, is dealing with depression, taking Wellbutrin 300 mg and also Zoloft 100 mg, still has difficulty sleeping,  We personally reviewed MRI of the brain in February 2022, no acute abnormality, mild small vessel disease  EEG was normal, she has normal passing out episode, had a cardiac monitoring, since August 2021, no significant pathology found   Update October 02, 2021 SS: Stopped the [employer], several months ago, didn't tell much difference, had trouble giving herself the injection, nurse neighbor was giving it to her. Probably tried at least 5 months. Many headaches come from neck with migraine features radiating forward localizing behind the eye with light sensitivity, getting trigger point injections from pain management with Dr. Vear Clock, put her back on Fioricet, takes 3 times a week. Bad migraines are once a week, takes Maxalt and tizanidine and works very well. Right now happy that she has handle of headaches. Never took the Lamictal. No recent passing out spells. The last was September when standing after running up the stairs to laundry room, got dizzy, felt fast heart beat, she sat down. Loop recorder has been normal. Last trigger point injection was in December, had series of 3. Is going next week. Has at least 4 headaches a week.   Update 04/02/22 SS: Admitted  03/12/22 for Ludwig's angina. Saw hemato acutelogy, concern of bleeding disorder, no indication of this was found.  She did have B12 deficiency.  REVIEW OF SYSTEMS: Out of a complete 14 system review of symptoms, the patient  complains only of the following symptoms, and all other reviewed systems are negative.  See HPI  ALLERGIES: Allergies  Allergen Reactions   Atorvastatin     Other reaction(s): joint pain   Meperidine    Other Other (See Comments)    Darvocet N "doesn't like the way it makes her feel"   Codeine Nausea Only   Metoclopramide     Akathisia   Mushroom Extract Complex Nausea Only    Nausea     HOME MEDICATIONS: Outpatient Medications Prior to Visit  Medication Sig Dispense Refill   albuterol (PROVENTIL HFA;VENTOLIN HFA) 108 (90 BASE) MCG/ACT inhaler Inhale 2 puffs into the lungs every 6 (six) hours as needed for wheezing or shortness of breath.     buPROPion (WELLBUTRIN XL) 300 MG 24 hr tablet Take 300 mg by mouth daily.      butalbital-acetaminophen-caffeine (FIORICET, ESGIC) 50-325-40 MG tablet Take 1 tablet by mouth 3 (three) times daily as needed for headache or migraine.     chlorhexidine (PERIDEX) 0.12 % solution Use as directed 15 mLs in the mouth or throat 2 (two) times daily. 473 mL 0   Cyanocobalamin (B-12) 1000 MCG SUBL Place 1,000 mcg under the tongue daily. 30 tablet 3   diclofenac sodium (VOLTAREN) 1 % GEL Apply 2 g topically daily as needed (joint pain).      hydrocortisone 2.5 % cream Apply 1 Application topically daily as needed (itching).     levothyroxine (SYNTHROID, LEVOTHROID) 50 MCG tablet Take 50 mcg by mouth daily before breakfast.     LORazepam (ATIVAN) 0.5 MG tablet Take 0.25-0.5 mg by mouth every 8 (eight) hours as needed for anxiety.     ondansetron (ZOFRAN) 4 MG tablet Take 1 tablet (4 mg total) by mouth as needed for nausea or vomiting. (Patient taking differently: Take 4 mg by mouth every 8 (eight) hours as needed for nausea or vomiting.) 15 tablet 3   rizatriptan (MAXALT-MLT) 10 MG disintegrating tablet Take 1 tablet (10 mg total) by mouth as needed for migraine. May repeat in 2 hours if needed (Patient taking differently: Take 10 mg by mouth daily as needed  for migraine. May repeat in 2 hours if needed) 10 tablet 6   rosuvastatin (CRESTOR) 5 MG tablet Take 5 mg by mouth daily.      sertraline (ZOLOFT) 100 MG tablet Take 100 mg by mouth every evening.      tiZANidine (ZANAFLEX) 2 MG tablet Take 1 tablet (2 mg total) by mouth every 8 (eight) hours as needed for muscle spasms. 30 tablet 3   tranexamic acid (LYSTEDA) 650 MG TABS tablet Take one tablet into 10-15 mL of water until dissolved. Swirl the total preparation around the mouth for two minutes and then expel. Repeat three times a day  for 24-48 hours. 6 tablet 0   triamcinolone (NASACORT) 55 MCG/ACT AERO nasal inhaler Place 1 spray into the nose daily as needed (congestion).     No facility-administered medications prior to visit.    PAST MEDICAL HISTORY: Past Medical History:  Diagnosis Date   Anxiety    Asthma    Chronic lower back pain    Dysrhythmia    pt got Tachy last time under anesthesia   High cholesterol  History of blood transfusion    "related to surgeries" (06/14/2018)   Hypothyroidism    Kidney disease, chronic, stage II (GFR 60-89 ml/min)    "one of my kidneys is in my abdomen" (06/14/2018)   Migraine    "daily-weekly" (06/14/2018)   Neuromuscular disorder ([place])    neuropathy in legs from back surgery   PONV (postoperative nausea and vomiting)    Scoliosis     PAST SURGICAL HISTORY: Past Surgical History:  Procedure Laterality Date   [place]     JOINT REPLACEMENT     KNEE ARTHROSCOPY Right    SPINAL FUSION  2008   "w/harrington rods, etc"   TOTAL HIP ARTHROPLASTY Left 06/14/2018   TOTAL HIP ARTHROPLASTY Left 06/14/2018   Procedure: LEFT TOTAL HIP ARTHROPLASTY ANTERIOR APPROACH;  Surgeon: Mcarthur Rossetti, MD;  Location: [place];  Service: Orthopedics;  Laterality: Left;    FAMILY HISTORY: Family History  Problem Relation Age of Onset   Lung cancer Mother        Hx  of ILD   Liver cancer Father     SOCIAL HISTORY: Social History   Socioeconomic History   Marital status: Married    Spouse name: Not on file   Number of children: 2   Years of education: college   Highest education level: Not on file  Occupational History   Occupation: Advertising account planner  Tobacco Use   Smoking status: Never   Smokeless tobacco: Never  Vaping Use   Vaping Use: Never used  Substance and Sexual Activity   Alcohol use: Yes    Comment: rare   Drug use: Never   Sexual activity: Not Currently  Other Topics Concern   Not on file  Social History Narrative   Lives at home with husband.   Right-handed.   2-3 cups coffee per day.   Social Determinants of Health   Financial Resource Strain: Not on file  Food Insecurity: Not on file  Transportation Needs: Not on file  Physical Activity: Not on file  Stress: Not on file  Social Connections: Not on file  Intimate Partner Violence: Not on file   PHYSICAL EXAM  There were no vitals filed for this visit.  There is no height or weight on file to calculate BMI.  Generalized: Well developed, in no acute distress   Neurological examination  Mentation: Alert oriented to time, place, history taking. Follows all commands speech and language fluent Cranial nerve II-XII: Pupils were equal round reactive to light. Extraocular movements were full, visual field were full on confrontational test. Facial sensation and strength were normal. Head turning and shoulder shrug  were normal and symmetric. Motor: The motor testing reveals 5 over 5 strength of all 4 extremities. Good symmetric motor tone is noted throughout.  Sensory: Sensory testing is intact to soft touch on all 4 extremities. No evidence of extinction is noted.  Coordination: Cerebellar testing reveals good finger-nose-finger and heel-to-shin bilaterally.  Gait and station: Gait is left sided lean, has scoliosis  Reflexes: Deep tendon reflexes are symmetric but  decreased   DIAGNOSTIC DATA (LABS, IMAGING, TESTING) - I reviewed patient records, labs, notes, testing and imaging myself where available.  Lab Results  Component Value Date   WBC 4.8 03/24/2022   HGB 11.7 (L) 03/24/2022   HCT 34.6 (L) 03/24/2022   MCV 93.3 03/24/2022   PLT 236 03/24/2022  Component Value Date/Time   NA 137 03/24/2022 1246   K 4.0 03/24/2022 1246   CL 105 03/24/2022 1246   CO2 25 03/24/2022 1246   GLUCOSE 93 03/24/2022 1246   BUN 16 03/24/2022 1246   CREATININE 1.11 (H) 03/24/2022 1246   CALCIUM 9.0 03/24/2022 1246   PROT 7.6 03/24/2022 1246   ALBUMIN 4.4 03/24/2022 1246   AST 18 03/24/2022 1246   ALT 18 03/24/2022 1246   ALKPHOS 89 03/24/2022 1246   BILITOT 0.4 03/24/2022 1246   GFRNONAA 57 (L) 03/24/2022 1246   GFRAA 57 (L) 01/15/2020 2002   No results found for: "CHOL", "HDL", "LDLCALC", "LDLDIRECT", "TRIG", "CHOLHDL" No results found for: "HGBA1C" Lab Results  Component Value Date   VITAMINB12 127 (L) 03/24/2022   No results found for: "TSH"  ASSESSMENT AND PLAN 61 y.o. year old female  has a past medical history of Anxiety, Asthma, Chronic lower back pain, Dysrhythmia, High cholesterol, History of blood transfusion, Hypothyroidism, Kidney disease, chronic, stage II (GFR 60-89 ml/min), Migraine, Neuromuscular disorder (HCC), PONV (postoperative nausea and vomiting), and Scoliosis. here with:   History of spina bifida, extensive lumbar decompressive surgery  Chronic migraine headache  Passing out spells in May 2021, status post loop recorder since August 2021  -MRI of the brain without contrast in February 2022 showed mild small vessel disease -EEG was normal -Try Botox therapy for chronic migraine headaches (at least 4 headaches a week with migraine features) -Previously tried Ajovy stopped, due to lack of benefit; already taking Wellbutrin, Zoloft, Fioricet, Maxalt, tizanidine, Ativan, Nucynta -Continue Maxalt 10 mg as needed for acute  headache, may combine with tizanidine, Tylenol for prolonged headache -Never took Lamictal for passing out spells, no recent significant episodes, has loop recorder, sees cardiology -Return back in 6 months or sooner if needed   Otila Kluver, DNP 04/01/2022, 11:37 AM [facility] [address] [place], [place] [address] [phone]

## 2022-04-02 ENCOUNTER — Telehealth: Payer: Self-pay

## 2022-04-02 ENCOUNTER — Ambulatory Visit: Payer: No Typology Code available for payment source | Admitting: Neurology

## 2022-04-02 ENCOUNTER — Encounter: Payer: Self-pay | Admitting: Neurology

## 2022-04-02 VITALS — BP 109/81 | HR 77 | Ht 62.5 in | Wt 128.0 lb

## 2022-04-02 DIAGNOSIS — G43719 Chronic migraine without aura, intractable, without status migrainosus: Secondary | ICD-10-CM

## 2022-04-02 MED ORDER — RIZATRIPTAN BENZOATE 10 MG PO TBDP: 10.0000 mg | ORAL_TABLET | ORAL | 6 refills | Status: DC | PRN

## 2022-04-02 MED ORDER — TIZANIDINE HCL 2 MG PO TABS: 2.0000 mg | ORAL_TABLET | Freq: Three times a day (TID) | ORAL | 3 refills | Status: AC | PRN

## 2022-04-02 NOTE — Patient Instructions (Signed)
We will proceed with Botox, expect a call from our office to schedule his appointment

## 2022-04-02 NOTE — Telephone Encounter (Signed)
Dental Clearance faxed to [facility] [phone].  Confirmation received

## 2022-04-13 NOTE — Progress Notes (Signed)
Carelink Summary Report / Loop Recorder 

## 2022-04-16 LAB — CUP PACEART REMOTE DEVICE CHECK
Date Time Interrogation Session: 20230805230850
Implantable Pulse Generator Implant Date: 20210823

## 2022-05-25 ENCOUNTER — Ambulatory Visit: Payer: No Typology Code available for payment source | Attending: Cardiovascular Disease

## 2022-05-25 DIAGNOSIS — R55 Syncope and collapse: Secondary | ICD-10-CM | POA: Diagnosis not present

## 2022-05-26 LAB — CUP PACEART REMOTE DEVICE CHECK
Date Time Interrogation Session: 20230917231322
Implantable Pulse Generator Implant Date: 20210823

## 2022-06-09 NOTE — Progress Notes (Signed)
Carelink Summary Report / Loop Recorder 

## 2022-06-29 ENCOUNTER — Ambulatory Visit (INDEPENDENT_AMBULATORY_CARE_PROVIDER_SITE_OTHER): Payer: No Typology Code available for payment source

## 2022-06-29 DIAGNOSIS — R55 Syncope and collapse: Secondary | ICD-10-CM | POA: Diagnosis not present

## 2022-06-30 ENCOUNTER — Other Ambulatory Visit: Payer: Self-pay | Admitting: Physician Assistant

## 2022-06-30 DIAGNOSIS — E538 Deficiency of other specified B group vitamins: Secondary | ICD-10-CM

## 2022-06-30 LAB — CUP PACEART REMOTE DEVICE CHECK
Date Time Interrogation Session: 20231020230808
Implantable Pulse Generator Implant Date: 20210823

## 2022-07-01 ENCOUNTER — Inpatient Hospital Stay: Payer: No Typology Code available for payment source | Admitting: Physician Assistant

## 2022-07-01 ENCOUNTER — Telehealth: Payer: Self-pay

## 2022-07-01 ENCOUNTER — Telehealth: Payer: Self-pay | Admitting: Physician Assistant

## 2022-07-01 ENCOUNTER — Inpatient Hospital Stay: Payer: No Typology Code available for payment source | Attending: Physician Assistant

## 2022-07-01 ENCOUNTER — Other Ambulatory Visit: Payer: Self-pay

## 2022-07-01 VITALS — BP 127/90 | HR 85 | Temp 98.1°F | Resp 17 | Ht 62.5 in | Wt 128.9 lb

## 2022-07-01 DIAGNOSIS — G8929 Other chronic pain: Secondary | ICD-10-CM | POA: Diagnosis not present

## 2022-07-01 DIAGNOSIS — Z801 Family history of malignant neoplasm of trachea, bronchus and lung: Secondary | ICD-10-CM | POA: Insufficient documentation

## 2022-07-01 DIAGNOSIS — E039 Hypothyroidism, unspecified: Secondary | ICD-10-CM | POA: Insufficient documentation

## 2022-07-01 DIAGNOSIS — E538 Deficiency of other specified B group vitamins: Secondary | ICD-10-CM | POA: Diagnosis not present

## 2022-07-01 DIAGNOSIS — Z79899 Other long term (current) drug therapy: Secondary | ICD-10-CM | POA: Insufficient documentation

## 2022-07-01 DIAGNOSIS — E7800 Pure hypercholesterolemia, unspecified: Secondary | ICD-10-CM | POA: Diagnosis not present

## 2022-07-01 DIAGNOSIS — N182 Chronic kidney disease, stage 2 (mild): Secondary | ICD-10-CM | POA: Insufficient documentation

## 2022-07-01 DIAGNOSIS — R5383 Other fatigue: Secondary | ICD-10-CM | POA: Insufficient documentation

## 2022-07-01 DIAGNOSIS — D649 Anemia, unspecified: Secondary | ICD-10-CM | POA: Insufficient documentation

## 2022-07-01 DIAGNOSIS — D689 Coagulation defect, unspecified: Secondary | ICD-10-CM | POA: Diagnosis present

## 2022-07-01 LAB — CBC WITH DIFFERENTIAL (CANCER CENTER ONLY)
Abs Immature Granulocytes: 0.01 10*3/uL (ref 0.00–0.07)
Basophils Absolute: 0 10*3/uL (ref 0.0–0.1)
Basophils Relative: 1 %
Eosinophils Absolute: 0.1 10*3/uL (ref 0.0–0.5)
Eosinophils Relative: 2 %
HCT: 35.8 % — ABNORMAL LOW (ref 36.0–46.0)
Hemoglobin: 12.1 g/dL (ref 12.0–15.0)
Immature Granulocytes: 0 %
Lymphocytes Relative: 36 %
Lymphs Abs: 1.4 10*3/uL (ref 0.7–4.0)
MCH: 31.1 pg (ref 26.0–34.0)
MCHC: 33.8 g/dL (ref 30.0–36.0)
MCV: 92 fL (ref 80.0–100.0)
Monocytes Absolute: 0.3 10*3/uL (ref 0.1–1.0)
Monocytes Relative: 8 %
Neutro Abs: 2.2 10*3/uL (ref 1.7–7.7)
Neutrophils Relative %: 53 %
Platelet Count: 174 10*3/uL (ref 150–400)
RBC: 3.89 MIL/uL (ref 3.87–5.11)
RDW: 12.2 % (ref 11.5–15.5)
WBC Count: 4 10*3/uL (ref 4.0–10.5)
nRBC: 0 % (ref 0.0–0.2)

## 2022-07-01 LAB — CMP (CANCER CENTER ONLY)
ALT: 10 U/L (ref 0–44)
AST: 15 U/L (ref 15–41)
Albumin: 4.3 g/dL (ref 3.5–5.0)
Alkaline Phosphatase: 91 U/L (ref 38–126)
Anion gap: 6 (ref 5–15)
BUN: 19 mg/dL (ref 8–23)
CO2: 27 mmol/L (ref 22–32)
Calcium: 9.4 mg/dL (ref 8.9–10.3)
Chloride: 109 mmol/L (ref 98–111)
Creatinine: 1.26 mg/dL — ABNORMAL HIGH (ref 0.44–1.00)
GFR, Estimated: 49 mL/min — ABNORMAL LOW (ref >60)
Glucose, Bld: 93 mg/dL (ref 70–99)
Potassium: 4.5 mmol/L (ref 3.5–5.1)
Sodium: 142 mmol/L (ref 135–145)
Total Bilirubin: 0.4 mg/dL (ref 0.3–1.2)
Total Protein: 7.2 g/dL (ref 6.5–8.1)

## 2022-07-01 LAB — VITAMIN B12: Vitamin B-12: 171 pg/mL — ABNORMAL LOW (ref 180–914)

## 2022-07-01 NOTE — Telephone Encounter (Signed)
-----   Message from Lincoln Brigham, PA-C sent at 07/01/2022 10:40 AM EDT ----- Please notify patient that her vitamin B12 levels slightly improved but still low. Need to arrange for B12 injection.

## 2022-07-01 NOTE — Telephone Encounter (Signed)
Per 10/25 los called and spoke to pt about appointments  

## 2022-07-01 NOTE — Telephone Encounter (Signed)
Pt advised with VU 

## 2022-07-01 NOTE — Progress Notes (Signed)
[facility] Telephone:([phone]   [phone]  PROGRESS NOTE  Patient Care Team: Laurann Montana, MD as PCP - General (Family Medicine) Croitoru, Rachelle Hora, MD as PCP - Cardiology (Cardiology)  CHIEF COMPLAINTS/PURPOSE OF CONSULTATION:  B12 deficiency anemia  HISTORY OF PRESENTING ILLNESS:  Nicole Morris 61 y.o. female returns for follow-up for vitamin B12 deficiency.  She is unaccompanied for this visit.   She underwent partial dental extractions and used tranexamic acid solution to minimize bleeding.  She reports she did bleed for a few hours after the procedure which resolved on its own.  She plans to schedule the remaining dental extractions in the next few weeks.  She reports persistent fatigue but continues to complete her ADLs on her own.  She denies any appetite changes or weight loss.  She denies nausea, vomiting or abdominal pain.  Her bowel habits are unchanged without any recurrent episodes of diarrhea or constipation.She denies fevers, chills, sweats, shortness of breath, chest pain or cough. She has no other complaints. Rest of the 10 point ROS is below.   MEDICAL HISTORY:  Past Medical History:  Diagnosis Date   Anxiety    Asthma    Chronic lower back pain    Dysrhythmia    pt got Tachy last time under anesthesia   High cholesterol    History of blood transfusion    "related to surgeries" (06/14/2018)   Hypothyroidism    Kidney disease, chronic, stage II (GFR 60-89 ml/min)    "one of my kidneys is in my abdomen" (06/14/2018)   Migraine    "daily-weekly" (06/14/2018)   Neuromuscular disorder (HCC)    neuropathy in legs from back surgery   PONV (postoperative nausea and vomiting)    Scoliosis     SURGICAL HISTORY: Past Surgical History:  Procedure Laterality Date   ABDOMINAL HYSTERECTOMY  1988   BACK SURGERY     BLADDER AUGMENTATION  1988   HERNIA REPAIR     JOINT REPLACEMENT     KNEE ARTHROSCOPY Right    SPINAL FUSION  2008    "w/harrington rods, etc"   TOTAL HIP ARTHROPLASTY Left 06/14/2018   TOTAL HIP ARTHROPLASTY Left 06/14/2018   Procedure: LEFT TOTAL HIP ARTHROPLASTY ANTERIOR APPROACH;  Surgeon: Kathryne Hitch, MD;  Location: [facility];  Service: Orthopedics;  Laterality: Left;    SOCIAL HISTORY: Social History   Socioeconomic History   Marital status: Married    Spouse name: Not on file   Number of children: 2   Years of education: college   Highest education level: Not on file  Occupational History   Occupation: Public librarian  Tobacco Use   Smoking status: Never   Smokeless tobacco: Never  Vaping Use   Vaping Use: Never used  Substance and Sexual Activity   Alcohol use: Yes    Comment: rare   Drug use: Never   Sexual activity: Not Currently  Other Topics Concern   Not on file  Social History Narrative   Lives at home with husband.   Right-handed.   2-3 cups coffee per day.   Social Determinants of Health   Financial Resource Strain: Not on file  Food Insecurity: Not on file  Transportation Needs: Not on file  Physical Activity: Not on file  Stress: Not on file  Social Connections: Not on file  Intimate Partner Violence: Not on file    FAMILY HISTORY: Family History  Problem Relation Age of Onset   Lung cancer Mother  Hx of ILD   Liver cancer Father     ALLERGIES:  is allergic to atorvastatin, meperidine, other, codeine, metoclopramide, and mushroom extract complex.  MEDICATIONS:  Current Outpatient Medications  Medication Sig Dispense Refill   albuterol (PROVENTIL HFA;VENTOLIN HFA) 108 (90 BASE) MCG/ACT inhaler Inhale 2 puffs into the lungs every 6 (six) hours as needed for wheezing or shortness of breath.     buPROPion (WELLBUTRIN XL) 300 MG 24 hr tablet Take 300 mg by mouth daily.      butalbital-acetaminophen-caffeine (FIORICET, ESGIC) 50-325-40 MG tablet Take 1 tablet by mouth 3 (three) times daily as needed for headache or migraine.     Cyanocobalamin  (B-12) 1000 MCG SUBL Place 1,000 mcg under the tongue daily. 30 tablet 3   diclofenac sodium (VOLTAREN) 1 % GEL Apply 2 g topically daily as needed (joint pain).      levothyroxine (SYNTHROID) 75 MCG tablet Take 75 mcg by mouth every morning.     LORazepam (ATIVAN) 0.5 MG tablet Take 0.25-0.5 mg by mouth every 8 (eight) hours as needed for anxiety.     ondansetron (ZOFRAN) 4 MG tablet Take 1 tablet (4 mg total) by mouth as needed for nausea or vomiting. (Patient taking differently: Take 4 mg by mouth every 8 (eight) hours as needed for nausea or vomiting.) 15 tablet 3   oxyCODONE (OXY IR/ROXICODONE) 5 MG immediate release tablet Take 5 mg by mouth 3 (three) times daily as needed.     rizatriptan (MAXALT-MLT) 10 MG disintegrating tablet Take 1 tablet (10 mg total) by mouth as needed for migraine. May repeat in 2 hours if needed 10 tablet 6   rosuvastatin (CRESTOR) 5 MG tablet Take 5 mg by mouth daily.      sertraline (ZOLOFT) 100 MG tablet Take 100 mg by mouth every evening.      tiZANidine (ZANAFLEX) 2 MG tablet Take 1 tablet (2 mg total) by mouth every 8 (eight) hours as needed for muscle spasms. 30 tablet 3   triamcinolone (NASACORT) 55 MCG/ACT AERO nasal inhaler Place 1 spray into the nose daily as needed (congestion).     chlorhexidine (PERIDEX) 0.12 % solution Use as directed 15 mLs in the mouth or throat 2 (two) times daily. (Patient not taking: Reported on 07/01/2022) 473 mL 0   cyclobenzaprine (FLEXERIL) 10 MG tablet Take 10 mg by mouth at bedtime. 04/02/22 haven't picked up yet (Patient not taking: Reported on 04/02/2022)     hydrocortisone 2.5 % cream Apply 1 Application topically daily as needed (itching). (Patient not taking: Reported on 07/01/2022)     tranexamic acid (LYSTEDA) 650 MG TABS tablet Take one tablet into 10-15 mL of water until dissolved. Swirl the total preparation around the mouth for two minutes and then expel. Repeat three times a day  for 24-48 hours. (Patient not taking:  Reported on 07/01/2022) 6 tablet 0   No current facility-administered medications for this visit.    REVIEW OF SYSTEMS:   Constitutional: ( - ) fevers, ( - )  chills , ( - ) night sweats Eyes: ( - ) blurriness of vision, ( - ) double vision, ( - ) watery eyes Ears, nose, mouth, throat, and face: ( - ) mucositis, ( - ) sore throat Respiratory: ( - ) cough, ( - ) dyspnea, ( - ) wheezes Cardiovascular: ( - ) palpitation, ( - ) chest discomfort, ( - ) lower extremity swelling Gastrointestinal:  ( - ) nausea, ( - ) heartburn, ( - )  change in bowel habits Skin: ( - ) abnormal skin rashes Lymphatics: ( - ) new lymphadenopathy, ( + ) easy bruising Neurological: ( - ) numbness, ( - ) tingling, ( - ) new weaknesses Behavioral/Psych: ( - ) mood change, ( - ) new changes  All other systems were reviewed with the patient and are negative.  PHYSICAL EXAMINATION: ECOG PERFORMANCE STATUS: 1 - Symptomatic but completely ambulatory  Vitals:   07/01/22 0843  BP: (!) 127/90  Pulse: 85  Resp: 17  Temp: 98.1 F (36.7 C)  SpO2: 100%   Filed Weights   07/01/22 0843  Weight: 128 lb 14.4 oz (58.5 kg)    GENERAL: well appearing female in NAD  SKIN: skin color, texture, turgor are normal, no rashes or significant lesions EYES: conjunctiva are pink and non-injected, sclera clear OROPHARYNX: no exudate, no erythema; lips, buccal mucosa, and tongue normal  LYMPH:  no palpable lymphadenopathy in the cervical or supraclavicular lymph nodes.  LUNGS: clear to auscultation and percussion with normal breathing effort HEART: regular rate & rhythm and no murmurs and no lower extremity edema Musculoskeletal: no cyanosis of digits and no clubbing  PSYCH: alert & oriented x 3, fluent speech NEURO: no focal motor/sensory deficits  LABORATORY DATA:  I have reviewed the data as listed    Latest Ref Rng & Units 07/01/2022    8:25 AM 03/24/2022   12:46 PM 03/15/2022    3:11 AM  CBC  WBC 4.0 - 10.5 K/uL 4.0  4.8   4.3   Hemoglobin 12.0 - 15.0 g/dL 12.1  11.7  9.7   Hematocrit 36.0 - 46.0 % 35.8  34.6  29.9   Platelets 150 - 400 K/uL 174  236  171        Latest Ref Rng & Units 07/01/2022    8:25 AM 03/24/2022   12:46 PM 03/15/2022    3:11 AM  CMP  Glucose 70 - 99 mg/dL 93  93  123   BUN 8 - 23 mg/dL 19  16  20    Creatinine 0.44 - 1.00 mg/dL 1.26  1.11  1.02   Sodium 135 - 145 mmol/L 142  137  142   Potassium 3.5 - 5.1 mmol/L 4.5  4.0  4.4   Chloride 98 - 111 mmol/L 109  105  115   CO2 22 - 32 mmol/L 27  25  22    Calcium 8.9 - 10.3 mg/dL 9.4  9.0  8.5   Total Protein 6.5 - 8.1 g/dL 7.2  7.6    Total Bilirubin 0.3 - 1.2 mg/dL 0.4  0.4    Alkaline Phos 38 - 126 U/L 91  89    AST 15 - 41 U/L 15  18    ALT 0 - 44 U/L 10  18        RADIOGRAPHIC STUDIES: I have personally reviewed the radiological images as listed and agreed with the findings in the report. CUP PACEART REMOTE DEVICE CHECK  Result Date: 06/30/2022 ILR summary report received. Battery status OK. Normal device function. No new symptom, tachy, brady, or pause episodes. No new AF episodes. Monthly summary reports and ROV/PRN LA   ASSESSMENT & PLAN Hilde Churchman is a 61 y.o. female who presents to the clinic for evaluation of possible bleeding disorder and obtain clearance for upcoming dental procedure.   #Bleeding with dental procedure: -- Work-up from 03/24/2022 showed no evidence of von Willebrand disorder.  Additionally PT/INR and PTT levels were normal so  there is no evidence to suggest bleeding disorder. --Patient has prescription for tranexamic acid solution to reduces risk of bleeding for dental procedure.   #Normocytic anemia: -- Labs from 03/24/2022 showed vitamin B12 127 pg/mL, MMA 1400 --Started on PO B12 supplementation --Labs today show that anemia has resolved with Hgb 12.1. Vitamin B12 has slightly improved to 171. MMA is pending --Recommend to switch to B12 injections weekly x 4 doses and then transition to once  a month.   No orders of the defined types were placed in this encounter.   All questions were answered. The patient knows to call the clinic with any problems, questions or concerns.  I have spent a total of 30 minutes minutes of face-to-face and non-face-to-face time, preparing to see the patient, performing a medically appropriate examination, counseling and educating the patient, ordering meds,  documenting clinical information in the electronic health record, and care coordination.   Dede Query, PA-C Department of Hematology/Oncology [place] at [facility] Phone: [phone]

## 2022-07-03 LAB — METHYLMALONIC ACID, SERUM: Methylmalonic Acid, Quantitative: 376 nmol/L (ref 0–378)

## 2022-07-08 ENCOUNTER — Inpatient Hospital Stay: Payer: No Typology Code available for payment source | Attending: Physician Assistant

## 2022-07-08 DIAGNOSIS — D519 Vitamin B12 deficiency anemia, unspecified: Secondary | ICD-10-CM | POA: Insufficient documentation

## 2022-07-15 ENCOUNTER — Inpatient Hospital Stay: Payer: No Typology Code available for payment source

## 2022-07-22 ENCOUNTER — Inpatient Hospital Stay: Payer: No Typology Code available for payment source

## 2022-07-27 NOTE — Progress Notes (Signed)
Carelink Summary Report / Loop Recorder 

## 2022-07-29 ENCOUNTER — Inpatient Hospital Stay: Payer: No Typology Code available for payment source

## 2022-07-29 ENCOUNTER — Ambulatory Visit (INDEPENDENT_AMBULATORY_CARE_PROVIDER_SITE_OTHER): Payer: No Typology Code available for payment source

## 2022-07-29 ENCOUNTER — Other Ambulatory Visit: Payer: Self-pay

## 2022-07-29 VITALS — BP 135/93 | HR 66 | Temp 98.4°F | Resp 16

## 2022-07-29 DIAGNOSIS — D519 Vitamin B12 deficiency anemia, unspecified: Secondary | ICD-10-CM | POA: Diagnosis present

## 2022-07-29 DIAGNOSIS — R55 Syncope and collapse: Secondary | ICD-10-CM | POA: Diagnosis not present

## 2022-07-29 DIAGNOSIS — E538 Deficiency of other specified B group vitamins: Secondary | ICD-10-CM

## 2022-07-29 MED ORDER — CYANOCOBALAMIN 1000 MCG/ML IJ SOLN
1000.0000 ug | Freq: Once | INTRAMUSCULAR | Status: AC
  Administered 2022-07-29: 1000 ug via INTRAMUSCULAR
  Filled 2022-07-29: qty 1

## 2022-07-31 LAB — CUP PACEART REMOTE DEVICE CHECK
Date Time Interrogation Session: 20231122230537
Implantable Pulse Generator Implant Date: 20210823

## 2022-08-05 ENCOUNTER — Inpatient Hospital Stay: Payer: No Typology Code available for payment source

## 2022-08-05 ENCOUNTER — Other Ambulatory Visit: Payer: Self-pay

## 2022-08-05 DIAGNOSIS — E538 Deficiency of other specified B group vitamins: Secondary | ICD-10-CM

## 2022-08-05 DIAGNOSIS — D519 Vitamin B12 deficiency anemia, unspecified: Secondary | ICD-10-CM | POA: Diagnosis not present

## 2022-08-05 MED ORDER — CYANOCOBALAMIN 1000 MCG/ML IJ SOLN
1000.0000 ug | Freq: Once | INTRAMUSCULAR | Status: DC
  Filled 2022-08-05: qty 1

## 2022-08-05 MED ORDER — CYANOCOBALAMIN 1000 MCG/ML IJ SOLN
1000.0000 ug | Freq: Once | INTRAMUSCULAR | Status: AC
  Administered 2022-08-05: 1000 ug via INTRAMUSCULAR

## 2022-08-12 ENCOUNTER — Other Ambulatory Visit: Payer: Self-pay

## 2022-08-12 ENCOUNTER — Inpatient Hospital Stay: Payer: No Typology Code available for payment source | Attending: Physician Assistant

## 2022-08-12 DIAGNOSIS — D519 Vitamin B12 deficiency anemia, unspecified: Secondary | ICD-10-CM | POA: Diagnosis present

## 2022-08-12 DIAGNOSIS — E538 Deficiency of other specified B group vitamins: Secondary | ICD-10-CM

## 2022-08-12 DIAGNOSIS — Z79899 Other long term (current) drug therapy: Secondary | ICD-10-CM | POA: Diagnosis not present

## 2022-08-12 MED ORDER — CYANOCOBALAMIN 1000 MCG/ML IJ SOLN
1000.0000 ug | Freq: Once | INTRAMUSCULAR | Status: AC
  Administered 2022-08-12: 1000 ug via INTRAMUSCULAR
  Filled 2022-08-12: qty 1

## 2022-08-19 ENCOUNTER — Inpatient Hospital Stay: Payer: No Typology Code available for payment source

## 2022-08-19 ENCOUNTER — Other Ambulatory Visit: Payer: Self-pay

## 2022-08-19 DIAGNOSIS — E538 Deficiency of other specified B group vitamins: Secondary | ICD-10-CM

## 2022-08-19 DIAGNOSIS — D519 Vitamin B12 deficiency anemia, unspecified: Secondary | ICD-10-CM | POA: Diagnosis not present

## 2022-08-19 MED ORDER — CYANOCOBALAMIN 1000 MCG/ML IJ SOLN
1000.0000 ug | Freq: Once | INTRAMUSCULAR | Status: AC
  Administered 2022-08-19: 1000 ug via INTRAMUSCULAR
  Filled 2022-08-19: qty 1

## 2022-08-19 NOTE — Patient Instructions (Signed)
Vitamin B12 Deficiency Vitamin B12 deficiency occurs when the body does not have enough of this important vitamin. The body needs this vitamin: To make red blood cells. To make DNA. This is the genetic material inside cells. To help the nerves work properly so they can carry messages from the brain to the body. Vitamin B12 deficiency can cause health problems, such as not having enough red blood cells in the blood (anemia). This can lead to nerve damage if untreated. What are the causes? This condition may be caused by: Not eating enough foods that contain vitamin B12. Not having enough stomach acid and digestive fluids to properly absorb vitamin B12 from the food that you eat. Having certain diseases that make it hard to absorb vitamin B12. These diseases include Crohn's disease, chronic pancreatitis, and cystic fibrosis. An autoimmune disorder in which the body does not make enough of a protein (intrinsic factor) within the stomach, resulting in not enough absorption of vitamin B12. Having a surgery in which part of the stomach or small intestine is removed. Taking certain medicines that make it hard for the body to absorb vitamin B12. These include: Heartburn medicines, such as antacids and proton pump inhibitors. Some medicines that are used to treat diabetes. What increases the risk? The following factors may make you more likely to develop a vitamin B12 deficiency: Being an older adult. Eating a vegetarian or vegan diet that does not include any foods that come from animals. Eating a poor diet while you are pregnant. Taking certain medicines. Having alcoholism. What are the signs or symptoms? In some cases, there are no symptoms of this condition. If the condition leads to anemia or nerve damage, various symptoms may occur, such as: Weakness. Tiredness (fatigue). Loss of appetite. Numbness or tingling in your hands and feet. Redness and burning of the tongue. Depression,  confusion, or memory problems. Trouble walking. If anemia is severe, symptoms can include: Shortness of breath. Dizziness. Rapid heart rate. How is this diagnosed? This condition may be diagnosed with a blood test to measure the level of vitamin B12 in your blood. You may also have other tests, including: A group of tests that measure certain characteristics of blood cells (complete blood count, CBC). A blood test to measure intrinsic factor. A procedure where a thin tube with a camera on the end is used to look into your stomach or intestines (endoscopy). Other tests may be needed to discover the cause of the deficiency. How is this treated? Treatment for this condition depends on the cause. This condition may be treated by: Changing your eating and drinking habits, such as: Eating more foods that contain vitamin B12. Drinking less alcohol or no alcohol. Getting vitamin B12 injections. Taking vitamin B12 supplements by mouth (orally). Your health care provider will tell you which dose is best for you. Follow these instructions at home: Eating and drinking  Include foods in your diet that come from animals and contain a lot of vitamin B12. These include: Meats and poultry. This includes beef, pork, chicken, turkey, and organ meats, such as liver. Seafood. This includes clams, rainbow trout, salmon, tuna, and haddock. Eggs. Dairy foods such as milk, yogurt, and cheese. Eat foods that have vitamin B12 added to them (are fortified), such as ready-to-eat breakfast cereals. Check the label on the package to see if a food is fortified. The items listed above may not be a complete list of foods and beverages you can eat and drink. Contact a dietitian for   more information. Alcohol use Do not drink alcohol if: Your health care provider tells you not to drink. You are pregnant, may be pregnant, or are planning to become pregnant. If you drink alcohol: Limit how much you have to: 0-1 drink a  day for women. 0-2 drinks a day for men. Know how much alcohol is in your drink. In the [place] one drink equals one 12 oz bottle of beer (355 mL), one 5 oz glass of wine (148 mL), or one 1 oz glass of hard liquor (44 mL). General instructions Get vitamin B12 injections if told to by your health care provider. Take supplements only as told by your health care provider. Follow the directions carefully. Keep all follow-up visits. This is important. Contact a health care provider if: Your symptoms come back. Your symptoms get worse or do not improve with treatment. Get help right away: You develop shortness of breath. You have a rapid heart rate. You have chest pain. You become dizzy or you faint. These symptoms may be an emergency. Get help right away. Call 911. Do not wait to see if the symptoms will go away. Do not drive yourself to the hospital. Summary Vitamin B12 deficiency occurs when the body does not have enough of this important vitamin. Common causes include not eating enough foods that contain vitamin B12, not being able to absorb vitamin B12 from the food that you eat, having a surgery in which part of the stomach or small intestine is removed, or taking certain medicines. Eat foods that have vitamin B12 in them. Treatment may include making a change in the way you eat and drink, getting vitamin B12 injections, or taking vitamin B12 supplements. This information is not intended to replace advice given to you by your health care provider. Make sure you discuss any questions you have with your health care provider. Document Revised: 04/18/2021 Document Reviewed: 04/18/2021 Elsevier Patient Education  2023 Elsevier Inc.  

## 2022-08-20 NOTE — Progress Notes (Signed)
Carelink Summary Report / Loop Recorder 

## 2022-08-27 ENCOUNTER — Ambulatory Visit (INDEPENDENT_AMBULATORY_CARE_PROVIDER_SITE_OTHER): Payer: No Typology Code available for payment source

## 2022-08-27 DIAGNOSIS — R55 Syncope and collapse: Secondary | ICD-10-CM

## 2022-09-01 LAB — CUP PACEART REMOTE DEVICE CHECK
Date Time Interrogation Session: 20231225231107
Implantable Pulse Generator Implant Date: 20210823

## 2022-09-17 NOTE — Progress Notes (Signed)
Carelink Summary Report / Loop Recorder 

## 2022-09-29 ENCOUNTER — Ambulatory Visit: Payer: No Typology Code available for payment source | Attending: Cardiovascular Disease

## 2022-09-29 DIAGNOSIS — R55 Syncope and collapse: Secondary | ICD-10-CM | POA: Diagnosis not present

## 2022-09-29 LAB — CUP PACEART REMOTE DEVICE CHECK
Date Time Interrogation Session: 20240122231458
Implantable Pulse Generator Implant Date: 20210823

## 2022-09-30 ENCOUNTER — Ambulatory Visit (INDEPENDENT_AMBULATORY_CARE_PROVIDER_SITE_OTHER): Payer: No Typology Code available for payment source

## 2022-09-30 ENCOUNTER — Encounter: Payer: Self-pay | Admitting: Orthopaedic Surgery

## 2022-09-30 ENCOUNTER — Ambulatory Visit: Payer: No Typology Code available for payment source | Admitting: Orthopaedic Surgery

## 2022-09-30 DIAGNOSIS — M5442 Lumbago with sciatica, left side: Secondary | ICD-10-CM | POA: Diagnosis not present

## 2022-09-30 DIAGNOSIS — M25552 Pain in left hip: Secondary | ICD-10-CM

## 2022-09-30 DIAGNOSIS — G8929 Other chronic pain: Secondary | ICD-10-CM | POA: Diagnosis not present

## 2022-09-30 NOTE — Progress Notes (Signed)
The patient is a very thin 62 year old female well-known to me.  We replaced her left hip back in 2021 through direct anterior approach.  She has not had some popping sensations in that hip for some time now.  Some of this though is related to her lumbar spine.  She has had extensive surgery on her lumbar spine with a complete fusion into her pelvis.  On exam I can easily put her left hip through internal ex rotation.  Some of her pain is of the trochanteric area but a lot of her pain is in the lower lumbar spine.  She is gotten to be quite thin and lost a lot of weight over the last year and so the hardware certainly prominent that she can feel at her spine as well.  2 views of lumbar spine send no acute findings.  She is very thin and so the hardware is quite prominent.  She understands that I am not a spine specialist and her back would need to be evaluated by her spine specialist at [facility].  X-rays of her left hip from today and compared to previous standing films show well-seated total hip arthroplasty with no complicating features.  The implants are bone ingrown.  There is slightly less offset is only thing I can see in terms of a difference of today's x-rays with less offset compared to previous x-rays.  I would like to send her to outpatient physical therapy for hip abduction strengthening exercises.  We can see her back in 6 weeks to see how she is coming along with therapy on her hip and therapy on her back.

## 2022-10-13 ENCOUNTER — Ambulatory Visit: Payer: No Typology Code available for payment source | Admitting: Neurology

## 2022-10-13 ENCOUNTER — Encounter: Payer: Self-pay | Admitting: Neurology

## 2022-10-13 VITALS — BP 138/87 | HR 80 | Ht 62.0 in | Wt 126.0 lb

## 2022-10-13 DIAGNOSIS — G43719 Chronic migraine without aura, intractable, without status migrainosus: Secondary | ICD-10-CM

## 2022-10-13 DIAGNOSIS — M419 Scoliosis, unspecified: Secondary | ICD-10-CM | POA: Diagnosis not present

## 2022-10-13 MED ORDER — ONDANSETRON HCL 4 MG PO TABS: 4.0000 mg | ORAL_TABLET | ORAL | 5 refills | Status: DC | PRN

## 2022-10-13 MED ORDER — RIZATRIPTAN BENZOATE 10 MG PO TBDP: 10.0000 mg | ORAL_TABLET | ORAL | 6 refills | Status: DC | PRN

## 2022-10-13 MED ORDER — NURTEC 75 MG PO TBDP: 75.0000 mg | ORAL_TABLET | ORAL | 11 refills | Status: AC

## 2022-10-13 NOTE — Patient Instructions (Addendum)
Let's try Nurtec for headache prevention, take 1 tablet every other day Use Maxalt for acute headache, may combine with zofran If headaches continue consider Botox, send me a message See you back 6 months or sooner if needed Follow up with your neurosurgeon at [facility]

## 2022-10-13 NOTE — Progress Notes (Signed)
PATIENT: Nicole Morris DOB: 1961/06/20  REASON FOR VISIT: Follow up for migraine headache  HISTORY FROM: Patient PRIMARY NEUROLOGIST: Dr. Terrace Arabia   HISTORY Nicole Morris is a 62 year old female, seen in request by her primary care physician Dr. Laurann Montana, for evaluation of frequent headaches, initial evaluation was on 09-30-2020   I reviewed and summarized the referring note. PMHX. Hypothyroidism, on supplement Hyperlipidemia, on Crestor 5 mg every night Anxiety, depression, taking Zoloft 100 mg daily, Wellbutrin 300 mg daily,: History of spinal bifida, scoliosis, left hip replacement, lumbar decompression surgery, pain management by Dr. Thyra Breed, taking Nucynta help 50 mg daily,   She reported a history of migraine headaches since childhood, gradually getting worse, since 2019, she began to have daily headaches, usually starting from her neck, spreading forward to become occipital area sometimes bilateral frontal behind eye pressure headaches, she complains of 6 out of 10 daily pressure headache, for that reason, over the past 2 years, she has been taking Fioricet every day,  about once a week, she would get more severe headache with light noise sensitivity, nauseous, movement made her headache worse, sometimes preceded by visual auras, lasting for hours, sleep usually helps,   She never tried triptan treatment in the past, is not taking preventive medications, denies a history of stroke and coronary artery disease   She had a history of spina bifid, had extensive back surgery in the past, also had a history of bilateral hip replacement,   She also reported 1 episode of unexplained passing out episode on Jan 15, 2020, there was no warning, she passed out from a standing position,   She had extensive bladder surgery since childhood, relied on self cath since 1988, she denies bowel incontinence, no gait abnormalities.   Laboratory evaluation in May 2021, CPK 146, negative  troponin, CMP, creatinine 1.12, CBC, with hemoglobin of 9.4    UPDATE July 26th 2022: She reported at least 50% improvement after starting Ajovy, her headache is less severe, less frequent, no significant side effect noted  She did try Imitrex 50 mg as needed, which takes away her headache fairly effectively, but she complains of extreme fatigue, heaviness sensation of the back  She complains of chronic stress, is dealing with depression, taking Wellbutrin 300 mg and also Zoloft 100 mg, still has difficulty sleeping,  We personally reviewed MRI of the brain in February 2022, no acute abnormality, mild small vessel disease  EEG was normal, she has normal passing out episode, had a cardiac monitoring, since August 2021, no significant pathology found   Update October 02, 2021 SS: Stopped the [employer], several months ago, didn't tell much difference, had trouble giving herself the injection, nurse neighbor was giving it to her. Probably tried at least 5 months. Many headaches come from neck with migraine features radiating forward localizing behind the eye with light sensitivity, getting trigger point injections from pain management with Dr. Vear Clock, put her back on Fioricet, takes 3 times a week. Bad migraines are once a week, takes Maxalt and tizanidine and works very well. Right now happy that she has handle of headaches. Never took the Lamictal. No recent passing out spells. The last was September when standing after running up the stairs to laundry room, got dizzy, felt fast heart beat, she sat down. Loop recorder has been normal. Last trigger point injection was in December, had series of 3. Is going next week. Has at least 4 headaches a week.   Update 04/02/22 SS: Admitted  03/12/22 for Ludwig's angina, from infected tooth, had migraine at the time. Saw hematology concern of bleeding disorder, no indication of this was found.  She did have B12 deficiency.  Has a loop order, no events noted. Has 4  headaches a week, 3 are mild to moderate, 1 is migraine. Uses Maxalt once a week, only has to take 1, gone within an hour, takes with tizanidine, Zofran. On 7/4 at the [facility], with migraine, was out of Maxalt, went to urgent care, went to ER, given IV Reglan, had seizure like activity as medication reaction. Insurance won't pay for [employer], not sleeping well. Needs to see pain clinic, Dr. Hardin Negus, to see what is next. Has Fioricet for occipital headaches, 2 times a week.   Update October 13, 2022 SS: daily headache for several weeks, occipital area, has been on the right side, can turn into migraine headache. Dr. Hardin Negus with pain clinic, has done trigger point injections, every 2 weeks for a period then takes a break. Had dry needling yesterday. Tizanidine helps, is out of Maxalt, has oxycodone for chronic pain in the back/neck. Has about 5 migraines a week. Her neurosurgeon was at [facility], Dr. Marykay Lex, discussed with her PT about going back to see if any pain management options at their clinic. Getting service dog for dizziness when out in public. LR has been doing well.   REVIEW OF SYSTEMS: Out of a complete 14 system review of symptoms, the patient complains only of the following symptoms, and all other reviewed systems are negative.  See HPI  ALLERGIES: Allergies  Allergen Reactions   Atorvastatin     Other reaction(s): joint pain   Meperidine    Other Other (See Comments)    Darvocet N "doesn't like the way it makes her feel"   Codeine Nausea Only   Metoclopramide     Akathisia   Mushroom Extract Complex Nausea Only    Nausea     HOME MEDICATIONS: Outpatient Medications Prior to Visit  Medication Sig Dispense Refill   albuterol (PROVENTIL HFA;VENTOLIN HFA) 108 (90 BASE) MCG/ACT inhaler Inhale 2 puffs into the lungs every 6 (six) hours as needed for wheezing or shortness of breath.     buPROPion (WELLBUTRIN XL) 300 MG 24 hr tablet Take 300 mg by mouth daily.       butalbital-acetaminophen-caffeine (FIORICET, ESGIC) 50-325-40 MG tablet Take 1 tablet by mouth 3 (three) times daily as needed for headache or migraine.     Cyanocobalamin (B-12) 1000 MCG SUBL Place 1,000 mcg under the tongue daily. 30 tablet 3   diclofenac sodium (VOLTAREN) 1 % GEL Apply 2 g topically daily as needed (joint pain).      levothyroxine (SYNTHROID) 75 MCG tablet Take 75 mcg by mouth every morning.     LORazepam (ATIVAN) 0.5 MG tablet Take 0.25-0.5 mg by mouth every 8 (eight) hours as needed for anxiety.     oxyCODONE (OXY IR/ROXICODONE) 5 MG immediate release tablet Take 5 mg by mouth 3 (three) times daily as needed.     rosuvastatin (CRESTOR) 5 MG tablet Take 5 mg by mouth daily.      sertraline (ZOLOFT) 100 MG tablet Take 100 mg by mouth every evening.      tiZANidine (ZANAFLEX) 2 MG tablet Take 1 tablet (2 mg total) by mouth every 8 (eight) hours as needed for muscle spasms. 30 tablet 3   triamcinolone (NASACORT) 55 MCG/ACT AERO nasal inhaler Place 1 spray into the nose daily as needed (congestion).  ondansetron (ZOFRAN) 4 MG tablet Take 1 tablet (4 mg total) by mouth as needed for nausea or vomiting. (Patient taking differently: Take 4 mg by mouth every 8 (eight) hours as needed for nausea or vomiting.) 15 tablet 3   rizatriptan (MAXALT-MLT) 10 MG disintegrating tablet Take 1 tablet (10 mg total) by mouth as needed for migraine. May repeat in 2 hours if needed 10 tablet 6   No facility-administered medications prior to visit.    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   PONV (postoperative nausea and vomiting)    Scoliosis     PAST SURGICAL HISTORY: Past Surgical History:  Procedure Laterality Date   [place]     JOINT REPLACEMENT     KNEE ARTHROSCOPY Right    SPINAL FUSION  2008   "w/harrington rods, etc"   TOTAL HIP ARTHROPLASTY Left 06/14/2018   TOTAL HIP ARTHROPLASTY Left 06/14/2018   Procedure: LEFT TOTAL HIP ARTHROPLASTY ANTERIOR APPROACH;  Surgeon: Mcarthur Rossetti, MD;  Location: [place];  Service: Orthopedics;  Laterality: Left;    FAMILY HISTORY: Family History  Problem Relation Age of Onset   Lung cancer Mother        Hx of ILD   Liver cancer Father     SOCIAL HISTORY: Social History   Socioeconomic History   Marital status: Married    Spouse name: Not on file   Number of children: 2   Years of education: college   Highest education level: Not on file  Occupational History   Occupation: Advertising account planner  Tobacco Use   Smoking status: Never   Smokeless tobacco: Never  Vaping Use   Vaping Use: Never used  Substance and Sexual Activity   Alcohol use: Yes    Comment: rare   Drug use: Never   Sexual activity: Not Currently  Other Topics Concern   Not on file  Social History Narrative   Lives at home with husband.   Right-handed.   2-3 cups coffee per day.   Social Determinants of Health   Financial Resource Strain: Not on file  Food Insecurity: Not on file  Transportation Needs: Not on file  Physical Activity: Not on file  Stress: Not on file  Social Connections: Not on file  Intimate Partner Violence: Not on file   PHYSICAL EXAM  Vitals:   10/13/22 0755  BP: 138/87  Pulse: 80  Weight: 126 lb (57.2 kg)  Height: 5\' 2"  (1.575 m)   Body mass index is 23.05 kg/m.  Generalized: Well developed, in no acute distress   Neurological examination  Mentation: Alert  oriented to time, place, history taking. Follows all commands speech and language fluent Cranial nerve II-XII: Pupils were equal round reactive to light. Extraocular movements were full, visual field were full on confrontational test. Facial sensation and strength  were normal. Head turning and shoulder shrug were normal and symmetric.  Right shoulder slightly higher than left at rest. Motor: The motor testing reveals 5 over 5 strength of all 4 extremities. Good symmetric motor tone is noted throughout.  Sensory: Sensory testing is intact to soft touch on all 4 extremities. No evidence of extinction is noted.  Coordination: Cerebellar testing reveals good finger-nose-finger and heel-to-shin bilaterally.  Gait and station: Gait is left sided lean, has scoliosis.  Tandem gait is unsteady. Reflexes: Deep tendon reflexes are symmetric   DIAGNOSTIC DATA (LABS, IMAGING, TESTING) - I reviewed patient records, labs, notes, testing and imaging myself where available.  Lab Results  Component Value Date   WBC 4.0 07/01/2022   HGB 12.1 07/01/2022   HCT 35.8 (L) 07/01/2022   MCV 92.0 07/01/2022   PLT 174 07/01/2022      Component Value Date/Time   NA 142 07/01/2022 0825   K 4.5 07/01/2022 0825   CL 109 07/01/2022 0825   CO2 27 07/01/2022 0825   GLUCOSE 93 07/01/2022 0825   BUN 19 07/01/2022 0825   CREATININE 1.26 (H) 07/01/2022 0825   CALCIUM 9.4 07/01/2022 0825   PROT 7.2 07/01/2022 0825   ALBUMIN 4.3 07/01/2022 0825   AST 15 07/01/2022 0825   ALT 10 07/01/2022 0825   ALKPHOS 91 07/01/2022 0825   BILITOT 0.4 07/01/2022 0825   GFRNONAA 49 (L) 07/01/2022 0825   GFRAA 57 (L) 01/15/2020 2002   No results found for: "CHOL", "HDL", "LDLCALC", "LDLDIRECT", "TRIG", "CHOLHDL" No results found for: "HGBA1C" Lab Results  Component Value Date   VITAMINB12 171 (L) 07/01/2022   No results found for: "TSH"  ASSESSMENT AND PLAN 62 y.o. year old female    History of spina bifida, extensive lumbar  decompressive surgery  Chronic migraine headache  Passing out spells in May 2021, status post loop recorder since August 2021  -Try Nurtec 75 mg tablet every other day for migraine preventative -Continue Maxalt, tizanidine, Zofran for severe migraine headaches -Previously tried Ajovy stopped, due to lack of benefit; already taking Wellbutrin, Zoloft, Fioricet, Maxalt, tizanidine, Ativan, oxycodone -MRI of the brain without contrast in February 2022 showed mild small vessel disease -EEG was normal -Next steps: Botox, discussed at last visit, she does not think she was about arranging -She is in a follow-up with her neurosurgeon at [facility], consider if any other injection options may be available -If Nurtec is not helpful, reach out via MyChart we will proceed with Botox  Butler Denmark, AGNP-C, DNP 10/13/2022, 8:19 AM [facility] [address], [place], [place] [address] [phone]

## 2022-10-27 NOTE — Progress Notes (Signed)
Carelink Summary Report / Loop Recorder 

## 2022-11-02 ENCOUNTER — Ambulatory Visit: Payer: No Typology Code available for payment source

## 2022-11-02 DIAGNOSIS — R55 Syncope and collapse: Secondary | ICD-10-CM | POA: Diagnosis not present

## 2022-11-03 LAB — CUP PACEART REMOTE DEVICE CHECK
Date Time Interrogation Session: 20240224231210
Implantable Pulse Generator Implant Date: 20210823

## 2022-11-11 ENCOUNTER — Ambulatory Visit: Payer: No Typology Code available for payment source | Admitting: Orthopaedic Surgery

## 2022-12-02 ENCOUNTER — Ambulatory Visit: Payer: No Typology Code available for payment source | Admitting: Orthopaedic Surgery

## 2022-12-07 ENCOUNTER — Ambulatory Visit: Payer: No Typology Code available for payment source | Attending: Cardiovascular Disease

## 2022-12-07 DIAGNOSIS — R55 Syncope and collapse: Secondary | ICD-10-CM | POA: Diagnosis not present

## 2022-12-08 LAB — CUP PACEART REMOTE DEVICE CHECK
Date Time Interrogation Session: 20240331231756
Implantable Pulse Generator Implant Date: 20210823

## 2022-12-14 NOTE — Progress Notes (Signed)
Carelink Summary Report / Loop Recorder 

## 2022-12-28 ENCOUNTER — Other Ambulatory Visit: Payer: Self-pay | Admitting: Physician Assistant

## 2022-12-28 ENCOUNTER — Encounter: Payer: Self-pay | Admitting: Orthopaedic Surgery

## 2022-12-28 ENCOUNTER — Ambulatory Visit: Payer: No Typology Code available for payment source | Admitting: Orthopaedic Surgery

## 2022-12-28 DIAGNOSIS — M2170 Unequal limb length (acquired), unspecified site: Secondary | ICD-10-CM

## 2022-12-28 DIAGNOSIS — E538 Deficiency of other specified B group vitamins: Secondary | ICD-10-CM

## 2022-12-28 DIAGNOSIS — Z96642 Presence of left artificial hip joint: Secondary | ICD-10-CM

## 2022-12-28 NOTE — Progress Notes (Signed)
The patient is well-known to me.  She is 62 years old and we did replace her left hip back in 2019.  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  We were able to improve her offset and leg length significantly.  She does have a significant spinal fusion going down all the way into the pelvis and sacrum.  With time she has developed a leg length difference.  When I saw her in January the implants were well bone ingrown with no complicating features but her x-rays definitely show left offset.  She has been through physical therapy working on hip abduction strengthening and that has helped some but she is certainly getting a clicking sensation in her hip.  When I have her lay in a supine position today there is definitely a leg length difference with her left side which is the operative side shorter than the right.  At this point I have recommended revision arthroplasty on her left hip but this would be mainly just increasing her head ball size on the components.  Her current left hip head ball is a 36+1.5.  We have room to go up several sizes to increase her leg length which will also help with her offset.  Since she is close to 5 years of the surgery, we would likely exchange the polyliner as well.  She is a thin individual.  I explained in detail what the surgery involves as well as why we are recommending this.  I do feel that having her more balance will help with her back and she agrees with this as well.  She would like to have her leg lengths back to a better position and I agree with this as well.  We will work on getting this scheduled sometime after June 10 due to a project she has at work.  All questions and concerns were addressed and answered.  We will be in touch with scheduling surgery.

## 2022-12-29 ENCOUNTER — Inpatient Hospital Stay: Payer: No Typology Code available for payment source | Attending: Physician Assistant

## 2022-12-29 ENCOUNTER — Inpatient Hospital Stay: Payer: No Typology Code available for payment source | Admitting: Physician Assistant

## 2022-12-29 ENCOUNTER — Other Ambulatory Visit: Payer: Self-pay

## 2022-12-29 VITALS — BP 119/82 | HR 68 | Temp 97.7°F | Resp 18 | Ht 62.0 in | Wt 126.6 lb

## 2022-12-29 DIAGNOSIS — E538 Deficiency of other specified B group vitamins: Secondary | ICD-10-CM | POA: Insufficient documentation

## 2022-12-29 DIAGNOSIS — Z79890 Hormone replacement therapy (postmenopausal): Secondary | ICD-10-CM | POA: Insufficient documentation

## 2022-12-29 DIAGNOSIS — N182 Chronic kidney disease, stage 2 (mild): Secondary | ICD-10-CM | POA: Insufficient documentation

## 2022-12-29 DIAGNOSIS — R5383 Other fatigue: Secondary | ICD-10-CM | POA: Insufficient documentation

## 2022-12-29 DIAGNOSIS — Z801 Family history of malignant neoplasm of trachea, bronchus and lung: Secondary | ICD-10-CM | POA: Insufficient documentation

## 2022-12-29 DIAGNOSIS — J45909 Unspecified asthma, uncomplicated: Secondary | ICD-10-CM | POA: Diagnosis not present

## 2022-12-29 DIAGNOSIS — Z79899 Other long term (current) drug therapy: Secondary | ICD-10-CM | POA: Insufficient documentation

## 2022-12-29 DIAGNOSIS — E7800 Pure hypercholesterolemia, unspecified: Secondary | ICD-10-CM | POA: Diagnosis not present

## 2022-12-29 DIAGNOSIS — E039 Hypothyroidism, unspecified: Secondary | ICD-10-CM | POA: Diagnosis not present

## 2022-12-29 DIAGNOSIS — Z800 Family history of malignant neoplasm of digestive organs: Secondary | ICD-10-CM | POA: Insufficient documentation

## 2022-12-29 DIAGNOSIS — M419 Scoliosis, unspecified: Secondary | ICD-10-CM | POA: Diagnosis not present

## 2022-12-29 LAB — CMP (CANCER CENTER ONLY)
ALT: 10 U/L (ref 0–44)
AST: 15 U/L (ref 15–41)
Albumin: 4.5 g/dL (ref 3.5–5.0)
Alkaline Phosphatase: 85 U/L (ref 38–126)
Anion gap: 8 (ref 5–15)
BUN: 17 mg/dL (ref 8–23)
CO2: 25 mmol/L (ref 22–32)
Calcium: 9.7 mg/dL (ref 8.9–10.3)
Chloride: 108 mmol/L (ref 98–111)
Creatinine: 1.15 mg/dL — ABNORMAL HIGH (ref 0.44–1.00)
GFR, Estimated: 54 mL/min — ABNORMAL LOW (ref >60)
Glucose, Bld: 96 mg/dL (ref 70–99)
Potassium: 4.3 mmol/L (ref 3.5–5.1)
Sodium: 141 mmol/L (ref 135–145)
Total Bilirubin: 0.4 mg/dL (ref 0.3–1.2)
Total Protein: 7.5 g/dL (ref 6.5–8.1)

## 2022-12-29 LAB — CBC WITH DIFFERENTIAL (CANCER CENTER ONLY)
Abs Immature Granulocytes: 0.01 10*3/uL (ref 0.00–0.07)
Basophils Absolute: 0 10*3/uL (ref 0.0–0.1)
Basophils Relative: 1 %
Eosinophils Absolute: 0.1 10*3/uL (ref 0.0–0.5)
Eosinophils Relative: 3 %
HCT: 37.2 % (ref 36.0–46.0)
Hemoglobin: 12.4 g/dL (ref 12.0–15.0)
Immature Granulocytes: 0 %
Lymphocytes Relative: 40 %
Lymphs Abs: 1.4 10*3/uL (ref 0.7–4.0)
MCH: 31 pg (ref 26.0–34.0)
MCHC: 33.3 g/dL (ref 30.0–36.0)
MCV: 93 fL (ref 80.0–100.0)
Monocytes Absolute: 0.3 10*3/uL (ref 0.1–1.0)
Monocytes Relative: 7 %
Neutro Abs: 1.8 10*3/uL (ref 1.7–7.7)
Neutrophils Relative %: 49 %
Platelet Count: 175 10*3/uL (ref 150–400)
RBC: 4 MIL/uL (ref 3.87–5.11)
RDW: 13 % (ref 11.5–15.5)
WBC Count: 3.6 10*3/uL — ABNORMAL LOW (ref 4.0–10.5)
nRBC: 0 % (ref 0.0–0.2)

## 2022-12-29 LAB — VITAMIN B12: Vitamin B-12: 127 pg/mL — ABNORMAL LOW (ref 180–914)

## 2022-12-29 NOTE — Progress Notes (Signed)
[facility] [phone]   [phone]  PROGRESS NOTE  Patient Care Team: Laurann Montana, MD as PCP - General (Family Medicine) Croitoru, Rachelle Hora, MD as PCP - Cardiology (Cardiology)  CHIEF COMPLAINTS/PURPOSE OF CONSULTATION:  B12 deficiency anemia  HISTORY OF PRESENTING ILLNESS:  Nicole Morris 62 y.o. female returns for follow-up for vitamin B12 deficiency. She was last seen on 07/02/2023. She last received her B12 injection on 08/19/2022.  She is unaccompanied for this visit.    Nicole Morris reports having persistent fatigue. She is taking sublingual B12 replacement daily. She denies appetite changes or weight loss.  She denies nausea, vomiting or abdominal pain.  Her bowel habits are unchanged without any recurrent episodes of diarrhea or constipation. She denies easy bruising or signs of bleeding. She denies fevers, chills, sweats, shortness of breath, chest pain or cough. She has no other complaints. Rest of the 10 point ROS is below.   MEDICAL HISTORY:  Past Medical History:  Diagnosis Date   Anxiety    Asthma    Chronic lower back pain    Dysrhythmia    pt got Tachy last time under anesthesia   High cholesterol    History of blood transfusion    "related to surgeries" (06/14/2018)   Hypothyroidism    Kidney disease, chronic, stage II (GFR 60-89 ml/min)    "one of my kidneys is in my abdomen" (06/14/2018)   Migraine    "daily-weekly" (06/14/2018)   Neuromuscular disorder (HCC)    neuropathy in legs from back surgery   PONV (postoperative nausea and vomiting)    Scoliosis     SURGICAL HISTORY: Past Surgical History:  Procedure Laterality Date   ABDOMINAL HYSTERECTOMY  1988   BACK SURGERY     BLADDER AUGMENTATION  1988   HERNIA REPAIR     JOINT REPLACEMENT     KNEE ARTHROSCOPY Right    SPINAL FUSION  2008   "w/harrington rods, etc"   TOTAL HIP ARTHROPLASTY Left 06/14/2018   TOTAL HIP ARTHROPLASTY Left 06/14/2018   Procedure:  LEFT TOTAL HIP ARTHROPLASTY ANTERIOR APPROACH;  Surgeon: Kathryne Hitch, MD;  Location: [facility] OR;  Service: Orthopedics;  Laterality: Left;    SOCIAL HISTORY: Social History   Socioeconomic History   Marital status: Married    Spouse name: Not on file   Number of children: 2   Years of education: college   Highest education level: Not on file  Occupational History   Occupation: Public librarian  Tobacco Use   Smoking status: Never   Smokeless tobacco: Never  Vaping Use   Vaping Use: Never used  Substance and Sexual Activity   Alcohol use: Yes    Comment: rare   Drug use: Never   Sexual activity: Not Currently  Other Topics Concern   Not on file  Social History Narrative   Lives at home with husband.   Right-handed.   2-3 cups coffee per day.   Social Determinants of Health   Financial Resource Strain: Not on file  Food Insecurity: Not on file  Transportation Needs: Not on file  Physical Activity: Not on file  Stress: Not on file  Social Connections: Not on file  Intimate Partner Violence: Not on file    FAMILY HISTORY: Family History  Problem Relation Age of Onset   Lung cancer Mother        Hx of ILD   Liver cancer Father     ALLERGIES:  is allergic to atorvastatin, meperidine,  other, codeine, metoclopramide, and mushroom extract complex.  MEDICATIONS:  Current Outpatient Medications  Medication Sig Dispense Refill   albuterol (PROVENTIL HFA;VENTOLIN HFA) 108 (90 BASE) MCG/ACT inhaler Inhale 2 puffs into the lungs every 6 (six) hours as needed for wheezing or shortness of breath.     buPROPion (WELLBUTRIN XL) 300 MG 24 hr tablet Take 300 mg by mouth daily.      butalbital-acetaminophen-caffeine (FIORICET, ESGIC) 50-325-40 MG tablet Take 1 tablet by mouth 3 (three) times daily as needed for headache or migraine.     Cyanocobalamin (B-12) 1000 MCG SUBL Place 1,000 mcg under the tongue daily. 30 tablet 3   diclofenac sodium (VOLTAREN) 1 % GEL Apply 2 g  topically daily as needed (joint pain).      levothyroxine (SYNTHROID) 75 MCG tablet Take 75 mcg by mouth every morning.     LORazepam (ATIVAN) 0.5 MG tablet Take 0.25-0.5 mg by mouth every 8 (eight) hours as needed for anxiety.     ondansetron (ZOFRAN) 4 MG tablet Take 1 tablet (4 mg total) by mouth as needed for nausea or vomiting. (Patient taking differently: Take 4 mg by mouth every 8 (eight) hours as needed for nausea or vomiting.) 15 tablet 3   oxyCODONE (OXY IR/ROXICODONE) 5 MG immediate release tablet Take 5 mg by mouth 3 (three) times daily as needed.     rizatriptan (MAXALT-MLT) 10 MG disintegrating tablet Take 1 tablet (10 mg total) by mouth as needed for migraine. May repeat in 2 hours if needed 10 tablet 6   rosuvastatin (CRESTOR) 5 MG tablet Take 5 mg by mouth daily.      sertraline (ZOLOFT) 100 MG tablet Take 100 mg by mouth every evening.      tiZANidine (ZANAFLEX) 2 MG tablet Take 1 tablet (2 mg total) by mouth every 8 (eight) hours as needed for muscle spasms. 30 tablet 3   triamcinolone (NASACORT) 55 MCG/ACT AERO nasal inhaler Place 1 spray into the nose daily as needed (congestion).     chlorhexidine (PERIDEX) 0.12 % solution Use as directed 15 mLs in the mouth or throat 2 (two) times daily. (Patient not taking: Reported on 07/01/2022) 473 mL 0   cyclobenzaprine (FLEXERIL) 10 MG tablet Take 10 mg by mouth at bedtime. 04/02/22 haven't picked up yet (Patient not taking: Reported on 04/02/2022)     hydrocortisone 2.5 % cream Apply 1 Application topically daily as needed (itching). (Patient not taking: Reported on 07/01/2022)     tranexamic acid (LYSTEDA) 650 MG TABS tablet Take one tablet into 10-15 mL of water until dissolved. Swirl the total preparation around the mouth for two minutes and then expel. Repeat three times a day  for 24-48 hours. (Patient not taking: Reported on 07/01/2022) 6 tablet 0   No current facility-administered medications for this visit.    REVIEW OF SYSTEMS:    Constitutional: ( - ) fevers, ( - )  chills , ( - ) night sweats Eyes: ( - ) blurriness of vision, ( - ) double vision, ( - ) watery eyes Ears, nose, mouth, throat, and face: ( - ) mucositis, ( - ) sore throat Respiratory: ( - ) cough, ( - ) dyspnea, ( - ) wheezes Cardiovascular: ( - ) palpitation, ( - ) chest discomfort, ( - ) lower extremity swelling Gastrointestinal:  ( - ) nausea, ( - ) heartburn, ( - ) change in bowel habits Skin: ( - ) abnormal skin rashes Lymphatics: ( - ) new lymphadenopathy, ( - )  easy bruising Neurological: ( - ) numbness, ( - ) tingling, ( - ) new weaknesses Behavioral/Psych: ( - ) mood change, ( - ) new changes  All other systems were reviewed with the patient and are negative.  PHYSICAL EXAMINATION: ECOG PERFORMANCE STATUS: 1 - Symptomatic but completely ambulatory  Vitals:   07/01/22 0843  BP: (!) 127/90  Pulse: 85  Resp: 17  Temp: 98.1 F (36.7 C)  SpO2: 100%   Filed Weights   07/01/22 0843  Weight: 128 lb 14.4 oz (58.5 kg)    GENERAL: well appearing female in NAD  SKIN: skin color, texture, turgor are normal, no rashes or significant lesions EYES: conjunctiva are pink and non-injected, sclera clear LUNGS: clear to auscultation and percussion with normal breathing effort HEART: regular rate & rhythm and no murmurs and no lower extremity edema Musculoskeletal: no cyanosis of digits and no clubbing  PSYCH: alert & oriented x 3, fluent speech NEURO: no focal motor/sensory deficits  LABORATORY DATA:  I have reviewed the data as listed    Latest Ref Rng & Units 07/01/2022    8:25 AM 03/24/2022   12:46 PM 03/15/2022    3:11 AM  CBC  WBC 4.0 - 10.5 K/uL 4.0  4.8  4.3   Hemoglobin 12.0 - 15.0 g/dL 16.1  09.6  9.7   Hematocrit 36.0 - 46.0 % 35.8  34.6  29.9   Platelets 150 - 400 K/uL 174  236  171        Latest Ref Rng & Units 07/01/2022    8:25 AM 03/24/2022   12:46 PM 03/15/2022    3:11 AM  CMP  Glucose 70 - 99 mg/dL 93  93  045   BUN 8  - 23 mg/dL 19  16  20    Creatinine 0.44 - 1.00 mg/dL 4.09  8.11  9.14   Sodium 135 - 145 mmol/L 142  137  142   Potassium 3.5 - 5.1 mmol/L 4.5  4.0  4.4   Chloride 98 - 111 mmol/L 109  105  115   CO2 22 - 32 mmol/L 27  25  22    Calcium 8.9 - 10.3 mg/dL 9.4  9.0  8.5   Total Protein 6.5 - 8.1 g/dL 7.2  7.6    Total Bilirubin 0.3 - 1.2 mg/dL 0.4  0.4    Alkaline Phos 38 - 126 U/L 91  89    AST 15 - 41 U/L 15  18    ALT 0 - 44 U/L 10  18        RADIOGRAPHIC STUDIES: I have personally reviewed the radiological images as listed and agreed with the findings in the report. CUP PACEART REMOTE DEVICE CHECK  Result Date: 06/30/2022 ILR summary report received. Battery status OK. Normal device function. No new symptom, tachy, brady, or pause episodes. No new AF episodes. Monthly summary reports and ROV/PRN LA   ASSESSMENT & PLAN Nesiah Jump is a 62 y.o. female returns for a follow up for B12 deficiency   #B12 deficiency anemia: --Received weekly B12 injection x 4 doses, completed on 08/19/2022 but did not continue afterwards.  --Currently on sublingual B12 supplement daily --Labs today show no evidence of anemia with Hgb of 12.4, MCV 93.0. Vitamin B12 is 127. MMA pending.  --Recommend to resume monthly B12 injections  --RTC in 6 months with labs.   #Bleeding with dental procedure: -- Work-up from 03/24/2022 showed no evidence of von Willebrand disorder.  Additionally PT/INR and  PTT levels were normal so there is no evidence to suggest bleeding disorder. --Patient has prescription for tranexamic acid solution to reduces risk of bleeding for dental procedure.   No orders of the defined types were placed in this encounter.   All questions were answered. The patient knows to call the clinic with any problems, questions or concerns.  I have spent a total of 25 minutes minutes of face-to-face and non-face-to-face time, preparing to see the patient, performing a medically appropriate  examination, counseling and educating the patient, ordering meds,  documenting clinical information in the electronic health record, and care coordination.   Georga Kaufmann, PA-C Department of Hematology/Oncology [facility] Phone: [phone]

## 2022-12-30 ENCOUNTER — Telehealth: Payer: Self-pay

## 2022-12-30 ENCOUNTER — Telehealth: Payer: Self-pay | Admitting: Physician Assistant

## 2022-12-30 NOTE — Telephone Encounter (Signed)
-----   Message from Briant Cedar, PA-C sent at 12/29/2022  4:17 PM EDT ----- Please notify patient that her B12 levels are low, need to resume monthly B12 injections.

## 2022-12-30 NOTE — Telephone Encounter (Signed)
Pt advised with VU 

## 2022-12-30 NOTE — Telephone Encounter (Signed)
Reached out to patient to schedule, left voicemail will mail calendars.

## 2023-01-04 LAB — METHYLMALONIC ACID, SERUM: Methylmalonic Acid, Quantitative: 841 nmol/L — ABNORMAL HIGH (ref 0–378)

## 2023-01-07 ENCOUNTER — Telehealth: Payer: Self-pay

## 2023-01-07 NOTE — Telephone Encounter (Signed)
I called patient to discuss scheduling surgery for hip.  She stated she saw her PCP and PCP wants her to see/talk to back doctor before proceeding with hip.  She will let us know when ready to get scheduled.

## 2023-01-11 ENCOUNTER — Ambulatory Visit (INDEPENDENT_AMBULATORY_CARE_PROVIDER_SITE_OTHER): Payer: No Typology Code available for payment source

## 2023-01-11 DIAGNOSIS — R55 Syncope and collapse: Secondary | ICD-10-CM

## 2023-01-11 LAB — CUP PACEART REMOTE DEVICE CHECK
Date Time Interrogation Session: 20240503230742
Implantable Pulse Generator Implant Date: 20210823

## 2023-01-14 NOTE — Progress Notes (Signed)
Carelink Summary Report / Loop Recorder 

## 2023-01-21 ENCOUNTER — Telehealth: Payer: Self-pay | Admitting: Neurology

## 2023-01-21 NOTE — Telephone Encounter (Signed)
LVM and sent mychart msg informing pt of need to reschedule 04/22/23 appt - NP out

## 2023-01-26 ENCOUNTER — Inpatient Hospital Stay: Payer: No Typology Code available for payment source | Attending: Physician Assistant

## 2023-01-26 DIAGNOSIS — E538 Deficiency of other specified B group vitamins: Secondary | ICD-10-CM | POA: Diagnosis present

## 2023-01-26 DIAGNOSIS — Z79899 Other long term (current) drug therapy: Secondary | ICD-10-CM | POA: Insufficient documentation

## 2023-01-26 MED ORDER — CYANOCOBALAMIN 1000 MCG/ML IJ SOLN
1000.0000 ug | Freq: Once | INTRAMUSCULAR | Status: AC
  Administered 2023-01-26: 1000 ug via INTRAMUSCULAR
  Filled 2023-01-26: qty 1

## 2023-01-26 NOTE — Patient Instructions (Signed)
Vitamin B12 and Folate Test Why am I having this test? Vitamin B12 and folate (folic acid) are B vitamins needed to make red blood cells and keep your nervous system healthy. Vitamin B12 is in foods such as meats, eggs, dairy products, and fish. Folate is in fruits, beans, and leafy green vegetables. Some foods, such as whole grains, bread, and cereals have vitamin B12 added to them (are fortified). You may not have enough of these B vitamins (have a deficiency) if your diet lacks these vitamins. Low levels can also be caused by diseases or having had surgeries on your stomach or small intestine that interfere with your ability to absorb the vitamins from your food. You may have a vitamin B12 and folate test if: You have symptoms of vitamin B12 or folate deficiency, such as tiredness (fatigue), headache, confusion, poor balance, or tingling and numbness in your hands and feet. You are pregnant or breastfeeding. Women who are pregnant or breastfeeding need more folate and may need to take dietary supplements. Your red blood cell count is low (anemia). You are an older person and have mental confusion. You have a disease or condition that may lead to a deficiency of these B vitamins. What is being tested? This test measures the amount of vitamin B12 and folate in your blood. The tests for vitamin B12 and folate may be done together or separately. What kind of sample is taken?  A blood sample is required for this test. It is usually collected by inserting a needle into a blood vessel. How do I prepare for this test? Follow instructions from your health care provider about eating and drinking before the test. Tell a health care provider about: All medicines you are taking, including vitamins, herbs, eye drops, creams, and over-the-counter medicines. Any medical conditions you have. Whether you are pregnant or may be pregnant. How often you drink alcohol. How are the results reported? Your test  results will be reported as values that identify the amount of vitamin B12 and folate in your blood. Your health care provider will compare your results to normal ranges that were established after testing a large group of people (reference ranges). Reference ranges may vary among labs and hospitals. For this test, common reference ranges are: Vitamin B12: 160-950 pg/mL or 118-701 pmol/L (SI units). Folate: 5-25 ng/mL or 11-57 nmol/L (SI units). What do the results mean? Results within the reference range are considered normal. Vitamin B12 or folate levels that are lower than the reference range may be caused by: Poor nutrition or eating a vegetarian or vegan diet that does not include any foods that come from animals. Having alcoholism. Having certain diseases that make it hard to absorb vitamin B12. These diseases include Crohn's disease, chronic pancreatitis, and cystic fibrosis. Taking certain medicines. Having had surgeries on your stomach or small intestine. High levels of vitamin B12 are rare, but they may happen if you have: Cancer. Liver disease. High levels of folate may happen if: You have anemia. You are vegetarian. You have had a recent blood transfusion. Talk with your health care provider about what your results mean. Questions to ask your health care provider Ask your health care provider, or the department that is doing the test: When will my results be ready? How will I get my results? What are my treatment options? What other tests do I need? What are my next steps? Summary Vitamin B12 and folate (folic acid) are both B vitamins that are needed to   make red blood cells and to keep your nervous system healthy. You may not have enough B vitamins in your body if you do not get enough in your diet or if you have a disease that makes it hard to absorb vitamin B12. This test measures the amount of vitamin B12 and folate in your blood. A blood sample is required for the  test. Talk with your health care provider about what your results mean. This information is not intended to replace advice given to you by your health care provider. Make sure you discuss any questions you have with your health care provider. Document Revised: 04/18/2021 Document Reviewed: 04/18/2021 Elsevier Patient Education  2023 [employer] 

## 2023-02-09 NOTE — Progress Notes (Signed)
Carelink Summary Report / Loop Recorder 

## 2023-02-11 LAB — CUP PACEART REMOTE DEVICE CHECK
Date Time Interrogation Session: 20240605230738
Implantable Pulse Generator Implant Date: 20210823

## 2023-02-15 ENCOUNTER — Ambulatory Visit: Payer: No Typology Code available for payment source

## 2023-02-15 DIAGNOSIS — R55 Syncope and collapse: Secondary | ICD-10-CM

## 2023-02-23 ENCOUNTER — Other Ambulatory Visit: Payer: Self-pay

## 2023-02-23 ENCOUNTER — Inpatient Hospital Stay: Payer: No Typology Code available for payment source | Attending: Physician Assistant

## 2023-02-23 VITALS — BP 130/95 | HR 72 | Temp 98.1°F | Resp 18

## 2023-02-23 DIAGNOSIS — E538 Deficiency of other specified B group vitamins: Secondary | ICD-10-CM | POA: Insufficient documentation

## 2023-02-23 DIAGNOSIS — Z79899 Other long term (current) drug therapy: Secondary | ICD-10-CM | POA: Diagnosis not present

## 2023-02-23 MED ORDER — CYANOCOBALAMIN 1000 MCG/ML IJ SOLN
1000.0000 ug | Freq: Once | INTRAMUSCULAR | Status: AC
  Administered 2023-02-23: 1000 ug via INTRAMUSCULAR
  Filled 2023-02-23: qty 1

## 2023-03-09 NOTE — Progress Notes (Signed)
Carelink Summary Report / Loop Recorder 

## 2023-03-15 ENCOUNTER — Ambulatory Visit: Payer: No Typology Code available for payment source

## 2023-03-15 DIAGNOSIS — R55 Syncope and collapse: Secondary | ICD-10-CM

## 2023-03-17 DIAGNOSIS — R55 Syncope and collapse: Secondary | ICD-10-CM

## 2023-03-17 LAB — CUP PACEART REMOTE DEVICE CHECK
Date Time Interrogation Session: 20240708230838
Implantable Pulse Generator Implant Date: 20210823

## 2023-03-23 ENCOUNTER — Other Ambulatory Visit: Payer: Self-pay

## 2023-03-23 ENCOUNTER — Inpatient Hospital Stay: Payer: No Typology Code available for payment source | Attending: Physician Assistant

## 2023-03-23 DIAGNOSIS — Z79899 Other long term (current) drug therapy: Secondary | ICD-10-CM | POA: Insufficient documentation

## 2023-03-23 DIAGNOSIS — E538 Deficiency of other specified B group vitamins: Secondary | ICD-10-CM | POA: Insufficient documentation

## 2023-03-23 MED ORDER — CYANOCOBALAMIN 1000 MCG/ML IJ SOLN
1000.0000 ug | Freq: Once | INTRAMUSCULAR | Status: AC
  Administered 2023-03-23: 1000 ug via INTRAMUSCULAR
  Filled 2023-03-23: qty 1

## 2023-04-01 NOTE — Progress Notes (Signed)
Carelink Summary Report / Loop Recorder 

## 2023-04-18 LAB — CUP PACEART REMOTE DEVICE CHECK
Date Time Interrogation Session: 20240810230722
Implantable Pulse Generator Implant Date: 20210823

## 2023-04-19 ENCOUNTER — Ambulatory Visit (INDEPENDENT_AMBULATORY_CARE_PROVIDER_SITE_OTHER): Payer: No Typology Code available for payment source

## 2023-04-19 DIAGNOSIS — R55 Syncope and collapse: Secondary | ICD-10-CM

## 2023-04-20 ENCOUNTER — Inpatient Hospital Stay: Payer: No Typology Code available for payment source | Attending: Physician Assistant

## 2023-04-20 ENCOUNTER — Other Ambulatory Visit: Payer: Self-pay

## 2023-04-20 VITALS — BP 132/89 | HR 63 | Temp 97.8°F | Resp 17

## 2023-04-20 DIAGNOSIS — Z79899 Other long term (current) drug therapy: Secondary | ICD-10-CM | POA: Insufficient documentation

## 2023-04-20 DIAGNOSIS — E538 Deficiency of other specified B group vitamins: Secondary | ICD-10-CM | POA: Diagnosis present

## 2023-04-20 MED ORDER — CYANOCOBALAMIN 1000 MCG/ML IJ SOLN
1000.0000 ug | Freq: Once | INTRAMUSCULAR | Status: AC
  Administered 2023-04-20: 1000 ug via INTRAMUSCULAR
  Filled 2023-04-20: qty 1

## 2023-04-20 NOTE — Patient Instructions (Signed)
Vitamin B12 Deficiency Vitamin B12 deficiency occurs when the body does not have enough of this important vitamin. The body needs this vitamin: To make red blood cells. To make DNA. This is the genetic material inside cells. To help the nerves work properly so they can carry messages from the brain to the body. Vitamin B12 deficiency can cause health problems, such as not having enough red blood cells in the blood (anemia). This can lead to nerve damage if untreated. What are the causes? This condition may be caused by: Not eating enough foods that contain vitamin B12. Not having enough stomach acid and digestive fluids to properly absorb vitamin B12 from the food that you eat. Having certain diseases that make it hard to absorb vitamin B12. These diseases include Crohn's disease, chronic pancreatitis, and cystic fibrosis. An autoimmune disorder in which the body does not make enough of a protein (intrinsic factor) within the stomach, resulting in not enough absorption of vitamin B12. Having a surgery in which part of the stomach or small intestine is removed. Taking certain medicines that make it hard for the body to absorb vitamin B12. These include: Heartburn medicines, such as antacids and proton pump inhibitors. Some medicines that are used to treat diabetes. What increases the risk? The following factors may make you more likely to develop a vitamin B12 deficiency: Being an older adult. Eating a vegetarian or vegan diet that does not include any foods that come from animals. Eating a poor diet while you are pregnant. Taking certain medicines. Having alcoholism. What are the signs or symptoms? In some cases, there are no symptoms of this condition. If the condition leads to anemia or nerve damage, various symptoms may occur, such as: Weakness. Tiredness (fatigue). Loss of appetite. Numbness or tingling in your hands and feet. Redness and burning of the tongue. Depression,  confusion, or memory problems. Trouble walking. If anemia is severe, symptoms can include: Shortness of breath. Dizziness. Rapid heart rate. How is this diagnosed? This condition may be diagnosed with a blood test to measure the level of vitamin B12 in your blood. You may also have other tests, including: A group of tests that measure certain characteristics of blood cells (complete blood count, CBC). A blood test to measure intrinsic factor. A procedure where a thin tube with a camera on the end is used to look into your stomach or intestines (endoscopy). Other tests may be needed to discover the cause of the deficiency. How is this treated? Treatment for this condition depends on the cause. This condition may be treated by: Changing your eating and drinking habits, such as: Eating more foods that contain vitamin B12. Drinking less alcohol or no alcohol. Getting vitamin B12 injections. Taking vitamin B12 supplements by mouth (orally). Your health care provider will tell you which dose is best for you. Follow these instructions at home: Eating and drinking  Include foods in your diet that come from animals and contain a lot of vitamin B12. These include: Meats and poultry. This includes beef, pork, chicken, turkey, and organ meats, such as liver. Seafood. This includes clams, rainbow trout, salmon, tuna, and haddock. Eggs. Dairy foods such as milk, yogurt, and cheese. Eat foods that have vitamin B12 added to them (are fortified), such as ready-to-eat breakfast cereals. Check the label on the package to see if a food is fortified. The items listed above may not be a complete list of foods and beverages you can eat and drink. Contact a dietitian for   more information. Alcohol use Do not drink alcohol if: Your health care provider tells you not to drink. You are pregnant, may be pregnant, or are planning to become pregnant. If you drink alcohol: Limit how much you have to: 0-1 drink a  day for women. 0-2 drinks a day for men. Know how much alcohol is in your drink. In the [place] one drink equals one 12 oz bottle of beer (355 mL), one 5 oz glass of wine (148 mL), or one 1 oz glass of hard liquor (44 mL). General instructions Get vitamin B12 injections if told to by your health care provider. Take supplements only as told by your health care provider. Follow the directions carefully. Keep all follow-up visits. This is important. Contact a health care provider if: Your symptoms come back. Your symptoms get worse or do not improve with treatment. Get help right away: You develop shortness of breath. You have a rapid heart rate. You have chest pain. You become dizzy or you faint. These symptoms may be an emergency. Get help right away. Call 911. Do not wait to see if the symptoms will go away. Do not drive yourself to the hospital. Summary Vitamin B12 deficiency occurs when the body does not have enough of this important vitamin. Common causes include not eating enough foods that contain vitamin B12, not being able to absorb vitamin B12 from the food that you eat, having a surgery in which part of the stomach or small intestine is removed, or taking certain medicines. Eat foods that have vitamin B12 in them. Treatment may include making a change in the way you eat and drink, getting vitamin B12 injections, or taking vitamin B12 supplements. This information is not intended to replace advice given to you by your health care provider. Make sure you discuss any questions you have with your health care provider. Document Revised: 04/18/2021 Document Reviewed: 04/18/2021 Elsevier Patient Education  2024 [employer].  

## 2023-04-22 ENCOUNTER — Ambulatory Visit: Payer: No Typology Code available for payment source | Admitting: Neurology

## 2023-05-04 NOTE — Progress Notes (Signed)
Carelink Summary Report / Loop Recorder 

## 2023-05-18 ENCOUNTER — Inpatient Hospital Stay: Payer: No Typology Code available for payment source | Attending: Physician Assistant

## 2023-05-23 LAB — CUP PACEART REMOTE DEVICE CHECK
Date Time Interrogation Session: 20240912231343
Implantable Pulse Generator Implant Date: 20210823

## 2023-05-24 ENCOUNTER — Ambulatory Visit (INDEPENDENT_AMBULATORY_CARE_PROVIDER_SITE_OTHER): Payer: No Typology Code available for payment source

## 2023-05-24 DIAGNOSIS — R55 Syncope and collapse: Secondary | ICD-10-CM

## 2023-06-09 NOTE — Progress Notes (Signed)
Carelink Summary Report / Loop Recorder 

## 2023-06-14 ENCOUNTER — Telehealth: Payer: Self-pay | Admitting: Physician Assistant

## 2023-06-14 ENCOUNTER — Other Ambulatory Visit: Payer: Self-pay | Admitting: Family Medicine

## 2023-06-14 DIAGNOSIS — Z1231 Encounter for screening mammogram for malignant neoplasm of breast: Secondary | ICD-10-CM

## 2023-06-15 ENCOUNTER — Inpatient Hospital Stay: Payer: No Typology Code available for payment source

## 2023-06-19 DIAGNOSIS — Z1231 Encounter for screening mammogram for malignant neoplasm of breast: Secondary | ICD-10-CM

## 2023-06-28 ENCOUNTER — Other Ambulatory Visit: Payer: Self-pay | Admitting: Physician Assistant

## 2023-06-28 ENCOUNTER — Ambulatory Visit: Payer: No Typology Code available for payment source

## 2023-06-28 DIAGNOSIS — R55 Syncope and collapse: Secondary | ICD-10-CM | POA: Diagnosis not present

## 2023-06-28 DIAGNOSIS — E538 Deficiency of other specified B group vitamins: Secondary | ICD-10-CM

## 2023-06-29 ENCOUNTER — Inpatient Hospital Stay: Payer: No Typology Code available for payment source

## 2023-06-29 ENCOUNTER — Other Ambulatory Visit: Payer: Self-pay

## 2023-06-29 ENCOUNTER — Inpatient Hospital Stay: Payer: No Typology Code available for payment source | Admitting: Physician Assistant

## 2023-06-29 ENCOUNTER — Inpatient Hospital Stay: Payer: No Typology Code available for payment source | Attending: Physician Assistant

## 2023-06-29 VITALS — BP 131/93 | HR 77 | Temp 97.7°F | Resp 16 | Wt 128.1 lb

## 2023-06-29 DIAGNOSIS — R110 Nausea: Secondary | ICD-10-CM | POA: Diagnosis not present

## 2023-06-29 DIAGNOSIS — E039 Hypothyroidism, unspecified: Secondary | ICD-10-CM | POA: Diagnosis not present

## 2023-06-29 DIAGNOSIS — J45909 Unspecified asthma, uncomplicated: Secondary | ICD-10-CM | POA: Insufficient documentation

## 2023-06-29 DIAGNOSIS — E7800 Pure hypercholesterolemia, unspecified: Secondary | ICD-10-CM | POA: Diagnosis not present

## 2023-06-29 DIAGNOSIS — R5383 Other fatigue: Secondary | ICD-10-CM | POA: Insufficient documentation

## 2023-06-29 DIAGNOSIS — G8929 Other chronic pain: Secondary | ICD-10-CM | POA: Diagnosis not present

## 2023-06-29 DIAGNOSIS — Z79899 Other long term (current) drug therapy: Secondary | ICD-10-CM | POA: Insufficient documentation

## 2023-06-29 DIAGNOSIS — E538 Deficiency of other specified B group vitamins: Secondary | ICD-10-CM

## 2023-06-29 DIAGNOSIS — Z79890 Hormone replacement therapy (postmenopausal): Secondary | ICD-10-CM | POA: Insufficient documentation

## 2023-06-29 DIAGNOSIS — G620 Drug-induced polyneuropathy: Secondary | ICD-10-CM | POA: Insufficient documentation

## 2023-06-29 DIAGNOSIS — Z801 Family history of malignant neoplasm of trachea, bronchus and lung: Secondary | ICD-10-CM | POA: Diagnosis not present

## 2023-06-29 DIAGNOSIS — N182 Chronic kidney disease, stage 2 (mild): Secondary | ICD-10-CM | POA: Diagnosis not present

## 2023-06-29 LAB — CBC WITH DIFFERENTIAL (CANCER CENTER ONLY)
Abs Immature Granulocytes: 0.02 10*3/uL (ref 0.00–0.07)
Basophils Absolute: 0 10*3/uL (ref 0.0–0.1)
Basophils Relative: 1 %
Eosinophils Absolute: 0.1 10*3/uL (ref 0.0–0.5)
Eosinophils Relative: 3 %
HCT: 36.4 % (ref 36.0–46.0)
Hemoglobin: 12 g/dL (ref 12.0–15.0)
Immature Granulocytes: 1 %
Lymphocytes Relative: 34 %
Lymphs Abs: 1.3 10*3/uL (ref 0.7–4.0)
MCH: 30.8 pg (ref 26.0–34.0)
MCHC: 33 g/dL (ref 30.0–36.0)
MCV: 93.3 fL (ref 80.0–100.0)
Monocytes Absolute: 0.3 10*3/uL (ref 0.1–1.0)
Monocytes Relative: 8 %
Neutro Abs: 2.1 10*3/uL (ref 1.7–7.7)
Neutrophils Relative %: 53 %
Platelet Count: 172 10*3/uL (ref 150–400)
RBC: 3.9 MIL/uL (ref 3.87–5.11)
RDW: 13.1 % (ref 11.5–15.5)
WBC Count: 3.9 10*3/uL — ABNORMAL LOW (ref 4.0–10.5)
nRBC: 0 % (ref 0.0–0.2)

## 2023-06-29 LAB — CMP (CANCER CENTER ONLY)
ALT: 13 U/L (ref 0–44)
AST: 18 U/L (ref 15–41)
Albumin: 4.4 g/dL (ref 3.5–5.0)
Alkaline Phosphatase: 102 U/L (ref 38–126)
Anion gap: 6 (ref 5–15)
BUN: 22 mg/dL (ref 8–23)
CO2: 27 mmol/L (ref 22–32)
Calcium: 9.8 mg/dL (ref 8.9–10.3)
Chloride: 107 mmol/L (ref 98–111)
Creatinine: 1.2 mg/dL — ABNORMAL HIGH (ref 0.44–1.00)
GFR, Estimated: 51 mL/min — ABNORMAL LOW (ref >60)
Glucose, Bld: 68 mg/dL — ABNORMAL LOW (ref 70–99)
Potassium: 4.6 mmol/L (ref 3.5–5.1)
Sodium: 140 mmol/L (ref 135–145)
Total Bilirubin: 0.4 mg/dL (ref 0.3–1.2)
Total Protein: 7.4 g/dL (ref 6.5–8.1)

## 2023-06-29 LAB — CUP PACEART REMOTE DEVICE CHECK
Date Time Interrogation Session: 20241020230240
Implantable Pulse Generator Implant Date: 20210823

## 2023-06-29 LAB — VITAMIN B12: Vitamin B-12: 253 pg/mL (ref 180–914)

## 2023-06-29 MED ORDER — CYANOCOBALAMIN 1000 MCG/ML IJ SOLN
1000.0000 ug | Freq: Once | INTRAMUSCULAR | Status: AC
Start: 2023-06-29 — End: 2023-06-29
  Administered 2023-06-29: 1000 ug via INTRAMUSCULAR
  Filled 2023-06-29: qty 1

## 2023-06-29 NOTE — Progress Notes (Signed)
[facility] [phone]   Fax:([phone]   PROGRESS NOTE  Patient Care Team: Laurann Montana, MD as PCP - General (Family Medicine) Croitoru, Rachelle Hora, MD as PCP - Cardiology (Cardiology)  CHIEF COMPLAINTS/PURPOSE OF CONSULTATION:  B12 deficiency anemia  CURRENT TREATMENT: Monthly B12 injection  HISTORY OF PRESENTING ILLNESS:  Nicole Morris 62 y.o. female returns for follow-up for vitamin B12 deficiency. She was last seen on 12/29/2022. She last received her B12 injection on 06/29/2023.  She is unaccompanied for this visit.    Ms. Noury reports she continues to have fatigue but is able to complete her ADLS on her own. She recently recovered from a kidney infection and received two courses of ciprofloxacin therapy. She had nausea with her infection which has dissipated.  Her bowel habits are unchanged without any recurrent episodes of diarrhea or constipation. She denies easy bruising or signs of bleeding. She denies fevers, chills, sweats, shortness of breath, chest pain or cough. She has no other complaints. Rest of the 10 point ROS is below.   MEDICAL HISTORY:  Past Medical History:  Diagnosis Date   Anxiety    Asthma    Chronic lower back pain    Dysrhythmia    pt got Tachy last time under anesthesia   High cholesterol    History of blood transfusion    "related to surgeries" (06/14/2018)   Hypothyroidism    Kidney disease, chronic, stage II (GFR 60-89 ml/min)    "one of my kidneys is in my abdomen" (06/14/2018)   Migraine    "daily-weekly" (06/14/2018)   Neuromuscular disorder (HCC)    neuropathy in legs from back surgery   PONV (postoperative nausea and vomiting)    Scoliosis     SURGICAL HISTORY: Past Surgical History:  Procedure Laterality Date   ABDOMINAL HYSTERECTOMY  1988   BACK SURGERY     BLADDER AUGMENTATION  1988   HERNIA REPAIR     JOINT REPLACEMENT     KNEE ARTHROSCOPY Right    SPINAL FUSION  2008   "w/harrington  rods, etc"   TOTAL HIP ARTHROPLASTY Left 06/14/2018   TOTAL HIP ARTHROPLASTY Left 06/14/2018   Procedure: LEFT TOTAL HIP ARTHROPLASTY ANTERIOR APPROACH;  Surgeon: Kathryne Hitch, MD;  Location: [facility] OR;  Service: Orthopedics;  Laterality: Left;    SOCIAL HISTORY: Social History   Socioeconomic History   Marital status: Married    Spouse name: Not on file   Number of children: 2   Years of education: college   Highest education level: Not on file  Occupational History   Occupation: Public librarian  Tobacco Use   Smoking status: Never   Smokeless tobacco: Never  Vaping Use   Vaping status: Never Used  Substance and Sexual Activity   Alcohol use: Yes    Comment: rare   Drug use: Never   Sexual activity: Not Currently  Other Topics Concern   Not on file  Social History Narrative   Lives at home with husband.   Right-handed.   2-3 cups coffee per day.   Social Determinants of Health   Financial Resource Strain: Not on file  Food Insecurity: Not on file  Transportation Needs: Not on file  Physical Activity: Not on file  Stress: Not on file  Social Connections: Not on file  Intimate Partner Violence: Not on file    FAMILY HISTORY: Family History  Problem Relation Age of Onset   Lung cancer Mother  Hx of ILD   Liver cancer Father     ALLERGIES:  is allergic to atorvastatin, meperidine, other, codeine, metoclopramide, and mushroom extract complex.  MEDICATIONS:  Current Outpatient Medications  Medication Sig Dispense Refill   albuterol (PROVENTIL HFA;VENTOLIN HFA) 108 (90 BASE) MCG/ACT inhaler Inhale 2 puffs into the lungs every 6 (six) hours as needed for wheezing or shortness of breath.     buPROPion (WELLBUTRIN XL) 300 MG 24 hr tablet Take 300 mg by mouth daily.      butalbital-acetaminophen-caffeine (FIORICET, ESGIC) 50-325-40 MG tablet Take 1 tablet by mouth 3 (three) times daily as needed for headache or migraine.     Cyanocobalamin (B-12) 1000  MCG SUBL Place 1,000 mcg under the tongue daily. 30 tablet 3   diclofenac sodium (VOLTAREN) 1 % GEL Apply 2 g topically daily as needed (joint pain).      levothyroxine (SYNTHROID) 50 MCG tablet Take 50 mcg by mouth every morning.     LORazepam (ATIVAN) 0.5 MG tablet Take 0.25-0.5 mg by mouth every 8 (eight) hours as needed for anxiety.     ondansetron (ZOFRAN) 4 MG tablet Take 1 tablet (4 mg total) by mouth as needed for nausea or vomiting. 15 tablet 5   oxyCODONE (OXY IR/ROXICODONE) 5 MG immediate release tablet Take 5 mg by mouth 3 (three) times daily as needed.     Rimegepant Sulfate (NURTEC) 75 MG TBDP Take 1 tablet (75 mg total) by mouth every other day. 16 tablet 11   rizatriptan (MAXALT-MLT) 10 MG disintegrating tablet Take 1 tablet (10 mg total) by mouth as needed for migraine. May repeat in 2 hours if needed 10 tablet 6   rosuvastatin (CRESTOR) 5 MG tablet Take 5 mg by mouth daily.      sertraline (ZOLOFT) 100 MG tablet Take 100 mg by mouth every evening.      tiZANidine (ZANAFLEX) 2 MG tablet Take 1 tablet (2 mg total) by mouth every 8 (eight) hours as needed for muscle spasms. 30 tablet 3   triamcinolone (NASACORT) 55 MCG/ACT AERO nasal inhaler Place 1 spray into the nose daily as needed (congestion).     No current facility-administered medications for this visit.    REVIEW OF SYSTEMS:   Constitutional: ( - ) fevers, ( - )  chills , ( - ) night sweats Eyes: ( - ) blurriness of vision, ( - ) double vision, ( - ) watery eyes Ears, nose, mouth, throat, and face: ( - ) mucositis, ( - ) sore throat Respiratory: ( - ) cough, ( - ) dyspnea, ( - ) wheezes Cardiovascular: ( - ) palpitation, ( - ) chest discomfort, ( - ) lower extremity swelling Gastrointestinal:  ( - ) nausea, ( - ) heartburn, ( - ) change in bowel habits Skin: ( - ) abnormal skin rashes Lymphatics: ( - ) new lymphadenopathy, ( - ) easy bruising Neurological: ( - ) numbness, ( - ) tingling, ( - ) new  weaknesses Behavioral/Psych: ( - ) mood change, ( - ) new changes  All other systems were reviewed with the patient and are negative.  PHYSICAL EXAMINATION: ECOG PERFORMANCE STATUS: 1 - Symptomatic but completely ambulatory  Vitals:   06/29/23 1020  BP: (!) 131/93  Pulse: 77  Resp: 16  Temp: 97.7 F (36.5 C)  SpO2: 100%   Filed Weights   06/29/23 1020  Weight: 128 lb 1.6 oz (58.1 kg)    GENERAL: well appearing female in NAD  SKIN: skin  color, texture, turgor are normal, no rashes or significant lesions EYES: conjunctiva are pink and non-injected, sclera clear LUNGS: clear to auscultation and percussion with normal breathing effort HEART: regular rate & rhythm and no murmurs and no lower extremity edema Musculoskeletal: no cyanosis of digits and no clubbing  PSYCH: alert & oriented x 3, fluent speech NEURO: no focal motor/sensory deficits  LABORATORY DATA:  I have reviewed the data as listed    Latest Ref Rng & Units 06/29/2023    9:58 AM 12/29/2022   10:06 AM 07/01/2022    8:25 AM  CBC  WBC 4.0 - 10.5 K/uL 3.9  3.6  4.0   Hemoglobin 12.0 - 15.0 g/dL 16.1  09.6  04.5   Hematocrit 36.0 - 46.0 % 36.4  37.2  35.8   Platelets 150 - 400 K/uL 172  175  174        Latest Ref Rng & Units 12/29/2022   10:06 AM 07/01/2022    8:25 AM 03/24/2022   12:46 PM  CMP  Glucose 70 - 99 mg/dL 96  93  93   BUN 8 - 23 mg/dL 17  19  16    Creatinine 0.44 - 1.00 mg/dL 4.09  8.11  9.14   Sodium 135 - 145 mmol/L 141  142  137   Potassium 3.5 - 5.1 mmol/L 4.3  4.5  4.0   Chloride 98 - 111 mmol/L 108  109  105   CO2 22 - 32 mmol/L 25  27  25    Calcium 8.9 - 10.3 mg/dL 9.7  9.4  9.0   Total Protein 6.5 - 8.1 g/dL 7.5  7.2  7.6   Total Bilirubin 0.3 - 1.2 mg/dL 0.4  0.4  0.4   Alkaline Phos 38 - 126 U/L 85  91  89   AST 15 - 41 U/L 15  15  18    ALT 0 - 44 U/L 10  10  18        RADIOGRAPHIC STUDIES: I have personally reviewed the radiological images as listed and agreed with the  findings in the report. No results found.  ASSESSMENT & PLAN Valari Whitler is a 62 y.o. female returns for a follow up for B12 deficiency   #B12 deficiency anemia: --Currently receiving monthly B12 injection, next dose due today --Labs today show no evidence of anemia with Hgb of 12.0, MCV 93.3. Vitamin B12 is 253. MMA pending.  --Continue with monthly B12 injections --RTC in 6 months with labs.   #Bleeding with dental procedure: -- Work-up from 03/24/2022 showed no evidence of von Willebrand disorder.  Additionally PT/INR and PTT levels were normal so there is no evidence to suggest bleeding disorder. --Patient has prescription for tranexamic acid solution to reduces risk of bleeding for dental procedure.   No orders of the defined types were placed in this encounter.   All questions were answered. The patient knows to call the clinic with any problems, questions or concerns.  I have spent a total of 25 minutes minutes of face-to-face and non-face-to-face time, preparing to see the patient, performing a medically appropriate examination, counseling and educating the patient, ordering meds,  documenting clinical information in the electronic health record, and care coordination.   Georga Kaufmann, PA-C Department of Hematology/Oncology [facility] at [facility] Phone: [phone]

## 2023-07-01 LAB — METHYLMALONIC ACID, SERUM: Methylmalonic Acid, Quantitative: 412 nmol/L — ABNORMAL HIGH (ref 0–378)

## 2023-07-15 NOTE — Progress Notes (Signed)
Carelink Summary Report / Loop Recorder 

## 2023-07-27 ENCOUNTER — Inpatient Hospital Stay: Payer: No Typology Code available for payment source | Attending: Physician Assistant

## 2023-07-27 VITALS — BP 124/93 | HR 88 | Resp 16

## 2023-07-27 DIAGNOSIS — E538 Deficiency of other specified B group vitamins: Secondary | ICD-10-CM | POA: Insufficient documentation

## 2023-07-27 MED ORDER — CYANOCOBALAMIN 1000 MCG/ML IJ SOLN
1000.0000 ug | Freq: Once | INTRAMUSCULAR | Status: AC
  Administered 2023-07-27: 1000 ug via INTRAMUSCULAR
  Filled 2023-07-27: qty 1

## 2023-07-27 NOTE — Patient Instructions (Signed)
 Vitamin B12 Injection What is this medication? Vitamin B12 (VAHY tuh min B12) prevents and treats low vitamin B12 levels in your body. It is used in people who do not get enough vitamin B12 from their diet or when their digestive tract does not absorb enough. Vitamin B12 plays an important role in maintaining the health of your nervous system and red blood cells. This medicine may be used for other purposes; ask your health care provider or pharmacist if you have questions. COMMON BRAND NAME(S): B-12 Compliance Kit, B-12 Injection Kit, Cyomin, Dodex, LA-12, Nutri-Twelve, Physicians EZ Use B-12, Primabalt, Vitamin Deficiency Injectable System - B12 What should I tell my care team before I take this medication? They need to know if you have any of these conditions: Kidney disease Leber's disease Megaloblastic anemia An unusual or allergic reaction to cyanocobalamin, cobalt, other medications, foods, dyes, or preservatives Pregnant or trying to get pregnant Breast-feeding How should I use this medication? This medication is injected into a muscle or deeply under the skin. It is usually given in a clinic or care team's office. However, your care team may teach you how to inject yourself. Follow all instructions. Talk to your care team about the use of this medication in children. Special care may be needed. Overdosage: If you think you have taken too much of this medicine contact a poison control center or emergency room at once. NOTE: This medicine is only for you. Do not share this medicine with others. What if I miss a dose? If you are given your dose at a clinic or care team's office, call to reschedule your appointment. If you give your own injections, and you miss a dose, take it as soon as you can. If it is almost time for your next dose, take only that dose. Do not take double or extra doses. What may interact with this medication? Alcohol Colchicine This list may not describe all possible  interactions. Give your health care provider a list of all the medicines, herbs, non-prescription drugs, or dietary supplements you use. Also tell them if you smoke, drink alcohol, or use illegal drugs. Some items may interact with your medicine. What should I watch for while using this medication? Visit your care team regularly. You may need blood work done while you are taking this medication. You may need to follow a special diet. Talk to your care team. Limit your alcohol intake and avoid smoking to get the best benefit. What side effects may I notice from receiving this medication? Side effects that you should report to your care team as soon as possible: Allergic reactions--skin rash, itching, hives, swelling of the face, lips, tongue, or throat Swelling of the ankles, hands, or feet Trouble breathing Side effects that usually do not require medical attention (report to your care team if they continue or are bothersome): Diarrhea This list may not describe all possible side effects. Call your doctor for medical advice about side effects. You may report side effects to FDA at 1-800-FDA-1088. Where should I keep my medication? Keep out of the reach of children. Store at room temperature between 15 and 30 degrees C (59 and 85 degrees F). Protect from light. Throw away any unused medication after the expiration date. NOTE: This sheet is a summary. It may not cover all possible information. If you have questions about this medicine, talk to your doctor, pharmacist, or health care provider.  2024 Elsevier/Gold Standard (2021-05-06 00:00:00)

## 2023-08-02 ENCOUNTER — Ambulatory Visit (INDEPENDENT_AMBULATORY_CARE_PROVIDER_SITE_OTHER): Payer: No Typology Code available for payment source

## 2023-08-02 DIAGNOSIS — R55 Syncope and collapse: Secondary | ICD-10-CM

## 2023-08-02 LAB — CUP PACEART REMOTE DEVICE CHECK
Date Time Interrogation Session: 20241122230634
Implantable Pulse Generator Implant Date: 20210823

## 2023-08-24 ENCOUNTER — Inpatient Hospital Stay: Payer: No Typology Code available for payment source | Attending: Physician Assistant

## 2023-08-24 ENCOUNTER — Telehealth: Payer: Self-pay

## 2023-08-24 NOTE — Telephone Encounter (Signed)
Pt missed her app today for her B12 injection.  LM for pt to call and reschedule

## 2023-08-30 NOTE — Progress Notes (Signed)
Carelink Summary Report / Loop Recorder 

## 2023-09-06 ENCOUNTER — Ambulatory Visit (INDEPENDENT_AMBULATORY_CARE_PROVIDER_SITE_OTHER): Payer: No Typology Code available for payment source

## 2023-09-06 DIAGNOSIS — R55 Syncope and collapse: Secondary | ICD-10-CM | POA: Diagnosis not present

## 2023-09-06 LAB — CUP PACEART REMOTE DEVICE CHECK
Date Time Interrogation Session: 20241229230524
Implantable Pulse Generator Implant Date: 20210823

## 2023-09-21 ENCOUNTER — Inpatient Hospital Stay: Payer: No Typology Code available for payment source | Attending: Physician Assistant

## 2023-09-21 VITALS — BP 134/86 | HR 73 | Temp 98.6°F | Resp 16

## 2023-09-21 DIAGNOSIS — Z79899 Other long term (current) drug therapy: Secondary | ICD-10-CM | POA: Diagnosis not present

## 2023-09-21 DIAGNOSIS — E538 Deficiency of other specified B group vitamins: Secondary | ICD-10-CM | POA: Insufficient documentation

## 2023-09-21 MED ORDER — CYANOCOBALAMIN 1000 MCG/ML IJ SOLN
1000.0000 ug | Freq: Once | INTRAMUSCULAR | Status: AC
  Administered 2023-09-21: 1000 ug via INTRAMUSCULAR
  Filled 2023-09-21: qty 1

## 2023-10-11 ENCOUNTER — Ambulatory Visit (INDEPENDENT_AMBULATORY_CARE_PROVIDER_SITE_OTHER): Payer: No Typology Code available for payment source

## 2023-10-11 DIAGNOSIS — R55 Syncope and collapse: Secondary | ICD-10-CM

## 2023-10-11 LAB — CUP PACEART REMOTE DEVICE CHECK
Date Time Interrogation Session: 20250202230434
Implantable Pulse Generator Implant Date: 20210823

## 2023-10-12 ENCOUNTER — Encounter: Payer: Self-pay | Admitting: Cardiovascular Disease

## 2023-10-19 ENCOUNTER — Inpatient Hospital Stay: Payer: No Typology Code available for payment source

## 2023-10-21 ENCOUNTER — Inpatient Hospital Stay: Payer: No Typology Code available for payment source | Attending: Physician Assistant

## 2023-10-21 VITALS — BP 127/94 | HR 69 | Temp 99.0°F | Resp 16

## 2023-10-21 DIAGNOSIS — E538 Deficiency of other specified B group vitamins: Secondary | ICD-10-CM | POA: Diagnosis present

## 2023-10-21 DIAGNOSIS — Z79899 Other long term (current) drug therapy: Secondary | ICD-10-CM | POA: Insufficient documentation

## 2023-10-21 MED ORDER — CYANOCOBALAMIN 1000 MCG/ML IJ SOLN
1000.0000 ug | Freq: Once | INTRAMUSCULAR | Status: AC
  Administered 2023-10-21: 1000 ug via INTRAMUSCULAR
  Filled 2023-10-21: qty 1

## 2023-11-15 ENCOUNTER — Ambulatory Visit (INDEPENDENT_AMBULATORY_CARE_PROVIDER_SITE_OTHER): Payer: No Typology Code available for payment source

## 2023-11-15 DIAGNOSIS — R55 Syncope and collapse: Secondary | ICD-10-CM | POA: Diagnosis not present

## 2023-11-15 LAB — CUP PACEART REMOTE DEVICE CHECK
Date Time Interrogation Session: 20250309230154
Implantable Pulse Generator Implant Date: 20210823

## 2023-11-16 ENCOUNTER — Inpatient Hospital Stay: Payer: No Typology Code available for payment source | Attending: Physician Assistant

## 2023-11-16 ENCOUNTER — Encounter: Payer: Self-pay | Admitting: Cardiovascular Disease

## 2023-11-16 DIAGNOSIS — Z79899 Other long term (current) drug therapy: Secondary | ICD-10-CM | POA: Insufficient documentation

## 2023-11-16 DIAGNOSIS — E538 Deficiency of other specified B group vitamins: Secondary | ICD-10-CM | POA: Insufficient documentation

## 2023-11-16 MED ORDER — CYANOCOBALAMIN 1000 MCG/ML IJ SOLN
1000.0000 ug | Freq: Once | INTRAMUSCULAR | Status: AC
  Administered 2023-11-16: 1000 ug via INTRAMUSCULAR
  Filled 2023-11-16: qty 1

## 2023-11-17 NOTE — Progress Notes (Signed)
 Carelink Summary Report / Loop Recorder

## 2023-12-02 ENCOUNTER — Other Ambulatory Visit: Payer: Self-pay | Admitting: Orthopaedic Surgery

## 2023-12-02 DIAGNOSIS — Z96642 Presence of left artificial hip joint: Secondary | ICD-10-CM

## 2023-12-07 ENCOUNTER — Ambulatory Visit
Admission: RE | Admit: 2023-12-07 | Discharge: 2023-12-07 | Disposition: A | Discharge status: Home / Self Care | Source: Ambulatory Visit | Attending: Orthopaedic Surgery | Admitting: Orthopaedic Surgery

## 2023-12-07 DIAGNOSIS — Z96642 Presence of left artificial hip joint: Secondary | ICD-10-CM

## 2023-12-13 ENCOUNTER — Other Ambulatory Visit: Payer: Self-pay | Admitting: Physician Assistant

## 2023-12-13 DIAGNOSIS — E538 Deficiency of other specified B group vitamins: Secondary | ICD-10-CM

## 2023-12-14 ENCOUNTER — Encounter: Payer: Self-pay | Admitting: Physician Assistant

## 2023-12-14 ENCOUNTER — Inpatient Hospital Stay: Payer: No Typology Code available for payment source | Admitting: Physician Assistant

## 2023-12-14 ENCOUNTER — Inpatient Hospital Stay: Payer: No Typology Code available for payment source | Attending: Physician Assistant

## 2023-12-14 ENCOUNTER — Inpatient Hospital Stay: Payer: No Typology Code available for payment source

## 2023-12-14 VITALS — BP 128/86 | HR 66 | Temp 97.2°F | Resp 13 | Wt 128.2 lb

## 2023-12-14 DIAGNOSIS — Z79899 Other long term (current) drug therapy: Secondary | ICD-10-CM | POA: Diagnosis not present

## 2023-12-14 DIAGNOSIS — E039 Hypothyroidism, unspecified: Secondary | ICD-10-CM | POA: Insufficient documentation

## 2023-12-14 DIAGNOSIS — E538 Deficiency of other specified B group vitamins: Secondary | ICD-10-CM | POA: Insufficient documentation

## 2023-12-14 DIAGNOSIS — M419 Scoliosis, unspecified: Secondary | ICD-10-CM | POA: Insufficient documentation

## 2023-12-14 DIAGNOSIS — N182 Chronic kidney disease, stage 2 (mild): Secondary | ICD-10-CM | POA: Insufficient documentation

## 2023-12-14 DIAGNOSIS — E7800 Pure hypercholesterolemia, unspecified: Secondary | ICD-10-CM | POA: Insufficient documentation

## 2023-12-14 DIAGNOSIS — J45909 Unspecified asthma, uncomplicated: Secondary | ICD-10-CM | POA: Insufficient documentation

## 2023-12-14 DIAGNOSIS — Z79890 Hormone replacement therapy (postmenopausal): Secondary | ICD-10-CM | POA: Diagnosis not present

## 2023-12-14 DIAGNOSIS — R110 Nausea: Secondary | ICD-10-CM | POA: Diagnosis not present

## 2023-12-14 DIAGNOSIS — Z801 Family history of malignant neoplasm of trachea, bronchus and lung: Secondary | ICD-10-CM | POA: Diagnosis not present

## 2023-12-14 DIAGNOSIS — R5383 Other fatigue: Secondary | ICD-10-CM | POA: Insufficient documentation

## 2023-12-14 DIAGNOSIS — G8929 Other chronic pain: Secondary | ICD-10-CM | POA: Diagnosis not present

## 2023-12-14 DIAGNOSIS — Z800 Family history of malignant neoplasm of digestive organs: Secondary | ICD-10-CM | POA: Insufficient documentation

## 2023-12-14 LAB — CMP (CANCER CENTER ONLY)
ALT: 21 U/L (ref 0–44)
AST: 23 U/L (ref 15–41)
Albumin: 4.6 g/dL (ref 3.5–5.0)
Alkaline Phosphatase: 100 U/L (ref 38–126)
Anion gap: 6 (ref 5–15)
BUN: 17 mg/dL (ref 8–23)
CO2: 29 mmol/L (ref 22–32)
Calcium: 9.3 mg/dL (ref 8.9–10.3)
Chloride: 106 mmol/L (ref 98–111)
Creatinine: 1.39 mg/dL — ABNORMAL HIGH (ref 0.44–1.00)
GFR, Estimated: 43 mL/min — ABNORMAL LOW (ref >60)
Glucose, Bld: 93 mg/dL (ref 70–99)
Potassium: 4.5 mmol/L (ref 3.5–5.1)
Sodium: 141 mmol/L (ref 135–145)
Total Bilirubin: 0.4 mg/dL (ref 0.0–1.2)
Total Protein: 7.6 g/dL (ref 6.5–8.1)

## 2023-12-14 LAB — CBC WITH DIFFERENTIAL (CANCER CENTER ONLY)
Abs Immature Granulocytes: 0.01 10*3/uL (ref 0.00–0.07)
Basophils Absolute: 0 10*3/uL (ref 0.0–0.1)
Basophils Relative: 1 %
Eosinophils Absolute: 0.1 10*3/uL (ref 0.0–0.5)
Eosinophils Relative: 3 %
HCT: 34.5 % — ABNORMAL LOW (ref 36.0–46.0)
Hemoglobin: 11.3 g/dL — ABNORMAL LOW (ref 12.0–15.0)
Immature Granulocytes: 0 %
Lymphocytes Relative: 38 %
Lymphs Abs: 1.4 10*3/uL (ref 0.7–4.0)
MCH: 30.5 pg (ref 26.0–34.0)
MCHC: 32.8 g/dL (ref 30.0–36.0)
MCV: 93 fL (ref 80.0–100.0)
Monocytes Absolute: 0.3 10*3/uL (ref 0.1–1.0)
Monocytes Relative: 7 %
Neutro Abs: 1.9 10*3/uL (ref 1.7–7.7)
Neutrophils Relative %: 51 %
Platelet Count: 154 10*3/uL (ref 150–400)
RBC: 3.71 MIL/uL — ABNORMAL LOW (ref 3.87–5.11)
RDW: 12.6 % (ref 11.5–15.5)
WBC Count: 3.7 10*3/uL — ABNORMAL LOW (ref 4.0–10.5)
nRBC: 0 % (ref 0.0–0.2)

## 2023-12-14 LAB — VITAMIN B12: Vitamin B-12: 326 pg/mL (ref 180–914)

## 2023-12-14 MED ORDER — CYANOCOBALAMIN 1000 MCG/ML IJ SOLN
1000.0000 ug | Freq: Once | INTRAMUSCULAR | Status: AC
Start: 2023-12-14 — End: 2023-12-14
  Administered 2023-12-14: 1000 ug via INTRAMUSCULAR
  Filled 2023-12-14: qty 1

## 2023-12-14 NOTE — Progress Notes (Signed)
 [facility] [phone]   Fax:(336) [phone]   PROGRESS NOTE  Patient Care Team: Laurann Montana, MD as PCP - General (Family Medicine) Croitoru, Rachelle Hora, MD as PCP - Cardiology (Cardiology)  CHIEF COMPLAINTS/PURPOSE OF CONSULTATION:  B12 deficiency anemia  CURRENT TREATMENT: Monthly B12 injection  HISTORY OF PRESENTING ILLNESS:  Nicole Morris 63 y.o. female returns for follow-up for vitamin B12 deficiency. She was last seen on 06/29/2023.  She is unaccompanied for this visit.    Nicole Morris reports she continues to have fatigue that is worse in the last 2-3 weeks. She continues to complete her ADLs on her own. She adds that she had more frequent dizziness in the past week without any syncopal episodes. She had nausea with her infection which has dissipated.  Her bowel habits are unchanged without any recurrent episodes of diarrhea or constipation. She denies easy bruising or signs of bleeding. She denies fevers, chills, sweats, shortness of breath, chest pain or cough. She has no other complaints. Rest of the 10 point ROS is below.   MEDICAL HISTORY:  Past Medical History:  Diagnosis Date   Anxiety    Asthma    Chronic lower back pain    Dysrhythmia    pt got Tachy last time under anesthesia   High cholesterol    History of blood transfusion    "related to surgeries" (06/14/2018)   Hypothyroidism    Kidney disease, chronic, stage II (GFR 60-89 ml/min)    "one of my kidneys is in my abdomen" (06/14/2018)   Migraine    "daily-weekly" (06/14/2018)   Neuromuscular disorder (HCC)    neuropathy in legs from back surgery   PONV (postoperative nausea and vomiting)    Scoliosis     SURGICAL HISTORY: Past Surgical History:  Procedure Laterality Date   ABDOMINAL HYSTERECTOMY  1988   BACK SURGERY     BLADDER AUGMENTATION  1988   HERNIA REPAIR     JOINT REPLACEMENT     KNEE ARTHROSCOPY Right    SPINAL FUSION  2008   "w/harrington rods, etc"    TOTAL HIP ARTHROPLASTY Left 06/14/2018   TOTAL HIP ARTHROPLASTY Left 06/14/2018   Procedure: LEFT TOTAL HIP ARTHROPLASTY ANTERIOR APPROACH;  Surgeon: Kathryne Hitch, MD;  Location: [facility] OR;  Service: Orthopedics;  Laterality: Left;    SOCIAL HISTORY: Social History   Socioeconomic History   Marital status: Married    Spouse name: Not on file   Number of children: 2   Years of education: college   Highest education level: Not on file  Occupational History   Occupation: Public librarian  Tobacco Use   Smoking status: Never   Smokeless tobacco: Never  Vaping Use   Vaping status: Never Used  Substance and Sexual Activity   Alcohol use: Yes    Comment: rare   Drug use: Never   Sexual activity: Not Currently  Other Topics Concern   Not on file  Social History Narrative   Lives at home with husband.   Right-handed.   2-3 cups coffee per day.   Social Drivers of Corporate investment banker Strain: Low Risk  (08/24/2023)   Received from [facility]   Overall Financial Resource Strain (CARDIA)    Difficulty of Paying Living Expenses: Not hard at all  Food Insecurity: No Food Insecurity (08/24/2023)   Received from [facility]   Hunger Vital Sign    Worried About Running Out of Food in the  Last Year: Never true    Ran Out of Food in the Last Year: Never true  Transportation Needs: Unknown (08/24/2023)   Received from [facility] - Transportation    In the past 12 months, has lack of transportation kept you from medical appointments or from getting medications?: No    Lack of Transportation (Non-Medical): Not on file  Physical Activity: Not on file  Stress: Not on file  Social Connections: Not on file  Intimate Partner Violence: Not on file    FAMILY HISTORY: Family History  Problem Relation Age of Onset   Lung cancer Mother        Hx of ILD   Liver cancer Father     ALLERGIES:  is allergic to  atorvastatin, meperidine, other, codeine, metoclopramide, and mushroom extract complex (obsolete).  MEDICATIONS:  Current Outpatient Medications  Medication Sig Dispense Refill   albuterol (PROVENTIL HFA;VENTOLIN HFA) 108 (90 BASE) MCG/ACT inhaler Inhale 2 puffs into the lungs every 6 (six) hours as needed for wheezing or shortness of breath.     buPROPion (WELLBUTRIN XL) 300 MG 24 hr tablet Take 300 mg by mouth daily.      butalbital-acetaminophen-caffeine (FIORICET, ESGIC) 50-325-40 MG tablet Take 1 tablet by mouth 3 (three) times daily as needed for headache or migraine.     Cyanocobalamin (B-12) 1000 MCG SUBL Place 1,000 mcg under the tongue daily. 30 tablet 3   diclofenac sodium (VOLTAREN) 1 % GEL Apply 2 g topically daily as needed (joint pain).      levothyroxine (SYNTHROID) 50 MCG tablet Take 50 mcg by mouth every morning.     LORazepam (ATIVAN) 0.5 MG tablet Take 0.25-0.5 mg by mouth every 8 (eight) hours as needed for anxiety.     ondansetron (ZOFRAN) 4 MG tablet Take 1 tablet (4 mg total) by mouth as needed for nausea or vomiting. 15 tablet 5   oxyCODONE (OXY IR/ROXICODONE) 5 MG immediate release tablet Take 5 mg by mouth 3 (three) times daily as needed.     Rimegepant Sulfate (NURTEC) 75 MG TBDP Take 1 tablet (75 mg total) by mouth every other day. 16 tablet 11   rizatriptan (MAXALT-MLT) 10 MG disintegrating tablet Take 1 tablet (10 mg total) by mouth as needed for migraine. May repeat in 2 hours if needed 10 tablet 6   rosuvastatin (CRESTOR) 5 MG tablet Take 5 mg by mouth daily.      sertraline (ZOLOFT) 100 MG tablet Take 100 mg by mouth every evening.      tiZANidine (ZANAFLEX) 2 MG tablet Take 1 tablet (2 mg total) by mouth every 8 (eight) hours as needed for muscle spasms. 30 tablet 3   triamcinolone (NASACORT) 55 MCG/ACT AERO nasal inhaler Place 1 spray into the nose daily as needed (congestion).     No current facility-administered medications for this visit.    REVIEW OF  SYSTEMS:   Constitutional: ( - ) fevers, ( - )  chills , ( - ) night sweats Eyes: ( - ) blurriness of vision, ( - ) double vision, ( - ) watery eyes Ears, nose, mouth, throat, and face: ( - ) mucositis, ( - ) sore throat Respiratory: ( - ) cough, ( - ) dyspnea, ( - ) wheezes Cardiovascular: ( - ) palpitation, ( - ) chest discomfort, ( - ) lower extremity swelling Gastrointestinal:  ( - ) nausea, ( - ) heartburn, ( - ) change in bowel habits Skin: ( - )  abnormal skin rashes Lymphatics: ( - ) new lymphadenopathy, ( - ) easy bruising Neurological: ( - ) numbness, ( - ) tingling, ( - ) new weaknesses Behavioral/Psych: ( - ) mood change, ( - ) new changes  All other systems were reviewed with the patient and are negative.  PHYSICAL EXAMINATION: ECOG PERFORMANCE STATUS: 1 - Symptomatic but completely ambulatory  Vitals:   12/14/23 1048  BP: 128/86  Pulse: 66  Resp: 13  Temp: (!) 97.2 F (36.2 C)  SpO2: 100%   Filed Weights   12/14/23 1048  Weight: 128 lb 3.2 oz (58.2 kg)    GENERAL: well appearing female in NAD  SKIN: skin color, texture, turgor are normal, no rashes or significant lesions EYES: conjunctiva are pink and non-injected, sclera clear LUNGS: clear to auscultation and percussion with normal breathing effort HEART: regular rate & rhythm and no murmurs and no lower extremity edema Musculoskeletal: no cyanosis of digits and no clubbing  PSYCH: alert & oriented x 3, fluent speech NEURO: no focal motor/sensory deficits  LABORATORY DATA:  I have reviewed the data as listed    Latest Ref Rng & Units 12/14/2023   10:18 AM 06/29/2023    9:58 AM 12/29/2022   10:06 AM  CBC  WBC 4.0 - 10.5 K/uL 3.7  3.9  3.6   Hemoglobin 12.0 - 15.0 g/dL 09.8  11.9  14.7   Hematocrit 36.0 - 46.0 % 34.5  36.4  37.2   Platelets 150 - 400 K/uL 154  172  175        Latest Ref Rng & Units 12/14/2023   10:18 AM 06/29/2023    9:58 AM 12/29/2022   10:06 AM  CMP  Glucose 70 - 99 mg/dL 93  68  96    BUN 8 - 23 mg/dL 17  22  17    Creatinine 0.44 - 1.00 mg/dL 8.29  5.62  1.30   Sodium 135 - 145 mmol/L 141  140  141   Potassium 3.5 - 5.1 mmol/L 4.5  4.6  4.3   Chloride 98 - 111 mmol/L 106  107  108   CO2 22 - 32 mmol/L 29  27  25    Calcium 8.9 - 10.3 mg/dL 9.3  9.8  9.7   Total Protein 6.5 - 8.1 g/dL 7.6  7.4  7.5   Total Bilirubin 0.0 - 1.2 mg/dL 0.4  0.4  0.4   Alkaline Phos 38 - 126 U/L 100  102  85   AST 15 - 41 U/L 23  18  15    ALT 0 - 44 U/L 21  13  10        RADIOGRAPHIC STUDIES: I have personally reviewed the radiological images as listed and agreed with the findings in the report. CUP PACEART REMOTE DEVICE CHECK Result Date: 11/15/2023 ILR summary report received. Battery status OK. Normal device function. No new symptom, tachy, brady, or pause episodes. No new AF episodes. Monthly summary reports and ROV/PRN - CS, CVRS   ASSESSMENT & PLAN Laura Caldas is a 63 y.o. female returns for a follow up for B12 deficiency   #B12 deficiency anemia: --Currently receiving monthly B12 injection, next dose due today --Labs today show no evidence of anemia with Hgb of 11.3, MCV 93.0. Vitamin B12 is 326. MMA pending.  --Continue with monthly B12 injections --RTC in 6 months with labs.   #Fatigue #Dizziness: --She has mild anemia which could contribute to her symptoms. However, recommend follow up with PCP  to evaluate other etiologies including thyroid dysfunction.   #Bleeding with dental procedure: -- Work-up from 03/24/2022 showed no evidence of von Willebrand disorder.  Additionally PT/INR and PTT levels were normal so there is no evidence to suggest bleeding disorder. --Patient has prescription for tranexamic acid solution to reduces risk of bleeding for dental procedure.   No orders of the defined types were placed in this encounter.   All questions were answered. The patient knows to call the clinic with any problems, questions or concerns.  I have spent a total of  25 minutes minutes of face-to-face and non-face-to-face time, preparing to see the patient, performing a medically appropriate examination, counseling and educating the patient, ordering meds,  documenting clinical information in the electronic health record, and care coordination.   Georga Kaufmann, PA-C Department of Hematology/Oncology [facility] at [facility] Phone: [phone]

## 2023-12-17 LAB — METHYLMALONIC ACID, SERUM: Methylmalonic Acid, Quantitative: 302 nmol/L (ref 0–378)

## 2023-12-20 ENCOUNTER — Ambulatory Visit (INDEPENDENT_AMBULATORY_CARE_PROVIDER_SITE_OTHER): Payer: No Typology Code available for payment source

## 2023-12-20 DIAGNOSIS — R55 Syncope and collapse: Secondary | ICD-10-CM | POA: Diagnosis not present

## 2023-12-20 LAB — CUP PACEART REMOTE DEVICE CHECK
Date Time Interrogation Session: 20250413230438
Implantable Pulse Generator Implant Date: 20210823

## 2023-12-20 NOTE — Progress Notes (Signed)
 Carelink Summary Report / Loop Recorder

## 2023-12-25 ENCOUNTER — Encounter: Payer: Self-pay | Admitting: Cardiovascular Disease

## 2024-01-11 ENCOUNTER — Inpatient Hospital Stay: Attending: Physician Assistant

## 2024-01-11 DIAGNOSIS — E538 Deficiency of other specified B group vitamins: Secondary | ICD-10-CM | POA: Diagnosis present

## 2024-01-11 DIAGNOSIS — Z79899 Other long term (current) drug therapy: Secondary | ICD-10-CM | POA: Insufficient documentation

## 2024-01-11 MED ORDER — CYANOCOBALAMIN 1000 MCG/ML IJ SOLN
1000.0000 ug | Freq: Once | INTRAMUSCULAR | Status: AC
  Administered 2024-01-11: 1000 ug via INTRAMUSCULAR
  Filled 2024-01-11: qty 1

## 2024-01-24 ENCOUNTER — Ambulatory Visit (INDEPENDENT_AMBULATORY_CARE_PROVIDER_SITE_OTHER): Payer: No Typology Code available for payment source

## 2024-01-24 DIAGNOSIS — R55 Syncope and collapse: Secondary | ICD-10-CM | POA: Diagnosis not present

## 2024-01-24 LAB — CUP PACEART REMOTE DEVICE CHECK
Date Time Interrogation Session: 20250518230701
Implantable Pulse Generator Implant Date: 20210823

## 2024-01-28 ENCOUNTER — Ambulatory Visit: Payer: Self-pay | Admitting: Cardiovascular Disease

## 2024-02-08 ENCOUNTER — Inpatient Hospital Stay

## 2024-02-09 NOTE — Progress Notes (Signed)
 Carelink Summary Report / Loop Recorder

## 2024-02-14 ENCOUNTER — Telehealth: Payer: Self-pay | Admitting: Physician Assistant

## 2024-02-14 ENCOUNTER — Inpatient Hospital Stay: Attending: Physician Assistant

## 2024-02-14 DIAGNOSIS — Z79899 Other long term (current) drug therapy: Secondary | ICD-10-CM | POA: Insufficient documentation

## 2024-02-14 DIAGNOSIS — E538 Deficiency of other specified B group vitamins: Secondary | ICD-10-CM | POA: Insufficient documentation

## 2024-02-15 ENCOUNTER — Inpatient Hospital Stay

## 2024-02-15 DIAGNOSIS — Z79899 Other long term (current) drug therapy: Secondary | ICD-10-CM | POA: Diagnosis not present

## 2024-02-15 DIAGNOSIS — E538 Deficiency of other specified B group vitamins: Secondary | ICD-10-CM | POA: Diagnosis present

## 2024-02-15 MED ORDER — CYANOCOBALAMIN 1000 MCG/ML IJ SOLN
1000.0000 ug | Freq: Once | INTRAMUSCULAR | Status: AC
  Administered 2024-02-15: 1000 ug via INTRAMUSCULAR
  Filled 2024-02-15: qty 1

## 2024-02-17 ENCOUNTER — Other Ambulatory Visit: Payer: Self-pay

## 2024-02-17 ENCOUNTER — Emergency Department (HOSPITAL_COMMUNITY)
Admission: EM | Admit: 2024-02-17 | Discharge: 2024-02-17 | Disposition: A | Discharge status: Home / Self Care | Attending: Emergency Medicine | Admitting: Emergency Medicine

## 2024-02-17 ENCOUNTER — Emergency Department (HOSPITAL_COMMUNITY)

## 2024-02-17 DIAGNOSIS — R112 Nausea with vomiting, unspecified: Secondary | ICD-10-CM | POA: Diagnosis present

## 2024-02-17 DIAGNOSIS — R1084 Generalized abdominal pain: Secondary | ICD-10-CM | POA: Insufficient documentation

## 2024-02-17 DIAGNOSIS — N189 Chronic kidney disease, unspecified: Secondary | ICD-10-CM | POA: Insufficient documentation

## 2024-02-17 DIAGNOSIS — J45909 Unspecified asthma, uncomplicated: Secondary | ICD-10-CM | POA: Diagnosis not present

## 2024-02-17 LAB — URINALYSIS, ROUTINE W REFLEX MICROSCOPIC
Bacteria, UA: NONE SEEN
Bilirubin Urine: NEGATIVE
Glucose, UA: NEGATIVE mg/dL
Hgb urine dipstick: NEGATIVE
Ketones, ur: NEGATIVE mg/dL
Leukocytes,Ua: NEGATIVE
Nitrite: NEGATIVE
Protein, ur: 100 mg/dL — AB
Specific Gravity, Urine: 1.012 (ref 1.005–1.030)
pH: 6 (ref 5.0–8.0)

## 2024-02-17 LAB — TROPONIN I (HIGH SENSITIVITY)
Troponin I (High Sensitivity): 5 ng/L
Troponin I (High Sensitivity): 6 ng/L (ref <18)

## 2024-02-17 LAB — COMPREHENSIVE METABOLIC PANEL WITH GFR
ALT: 16 U/L (ref 0–44)
AST: 21 U/L (ref 15–41)
Albumin: 4.2 g/dL (ref 3.5–5.0)
Alkaline Phosphatase: 88 U/L (ref 38–126)
Anion gap: 8 (ref 5–15)
BUN: 21 mg/dL (ref 8–23)
CO2: 21 mmol/L — ABNORMAL LOW (ref 22–32)
Calcium: 9.4 mg/dL (ref 8.9–10.3)
Chloride: 112 mmol/L — ABNORMAL HIGH (ref 98–111)
Creatinine, Ser: 1.33 mg/dL — ABNORMAL HIGH (ref 0.44–1.00)
GFR, Estimated: 45 mL/min — ABNORMAL LOW (ref >60)
Glucose, Bld: 123 mg/dL — ABNORMAL HIGH (ref 70–99)
Potassium: 4.1 mmol/L (ref 3.5–5.1)
Sodium: 141 mmol/L (ref 135–145)
Total Bilirubin: 0.7 mg/dL (ref 0.0–1.2)
Total Protein: 6.7 g/dL (ref 6.5–8.1)

## 2024-02-17 LAB — CBC
HCT: 38.4 % (ref 36.0–46.0)
Hemoglobin: 12.3 g/dL (ref 12.0–15.0)
MCH: 30.8 pg (ref 26.0–34.0)
MCHC: 32 g/dL (ref 30.0–36.0)
MCV: 96 fL (ref 80.0–100.0)
Platelets: 202 10*3/uL (ref 150–400)
RBC: 4 MIL/uL (ref 3.87–5.11)
RDW: 13 % (ref 11.5–15.5)
WBC: 6 10*3/uL (ref 4.0–10.5)
nRBC: 0 % (ref 0.0–0.2)

## 2024-02-17 LAB — LIPASE, BLOOD: Lipase: 37 U/L (ref 11–51)

## 2024-02-17 MED ORDER — ONDANSETRON HCL 4 MG/2ML IJ SOLN
4.0000 mg | Freq: Once | INTRAMUSCULAR | Status: AC
  Administered 2024-02-17: 4 mg via INTRAVENOUS
  Filled 2024-02-17: qty 2

## 2024-02-17 MED ORDER — IOHEXOL 350 MG/ML SOLN
75.0000 mL | Freq: Once | INTRAVENOUS | Status: AC | PRN
  Administered 2024-02-17: 75 mL via INTRAVENOUS

## 2024-02-17 MED ORDER — ONDANSETRON 4 MG PO TBDP: 4.0000 mg | ORAL_TABLET | Freq: Three times a day (TID) | ORAL | 0 refills | Status: DC | PRN

## 2024-02-17 MED ORDER — SODIUM CHLORIDE 0.9 % IV BOLUS
1000.0000 mL | Freq: Once | INTRAVENOUS | Status: AC
  Administered 2024-02-17: 1000 mL via INTRAVENOUS

## 2024-02-17 MED ORDER — FENTANYL CITRATE PF 50 MCG/ML IJ SOSY
50.0000 ug | PREFILLED_SYRINGE | Freq: Once | INTRAMUSCULAR | Status: AC
  Administered 2024-02-17: 50 ug via INTRAVENOUS
  Filled 2024-02-17: qty 1

## 2024-02-17 MED ORDER — PANTOPRAZOLE SODIUM 20 MG PO TBEC: 20.0000 mg | DELAYED_RELEASE_TABLET | Freq: Every day | ORAL | 0 refills | Status: DC

## 2024-02-17 NOTE — ED Triage Notes (Addendum)
 Pt. Stated, Nicole Morris had stomach pain with N/V since this morning. It feels like my abdomen is going to explode. I took a Zofran  and no help. I threw up 2 more times after that.

## 2024-02-17 NOTE — ED Provider Notes (Signed)
 [place] [facility]ERG[facility] DE[facility]MENT AT [facility] Provider Note   CSN: [id number] Arrival date & time: 02/17/24  1217     Patient presents with: Abdominal Pain, Nausea, Emesis, and Chills   Nicole Morris is a 63 y.o. female.   Patient here with abdominal pain since this morning.  Nausea and vomiting.  Threw up about 5 times.  Not having any bowel movements.  Not passing gas.  History of multiple abdominal surgeries.  Denies any fever or chills.  History of asthma high cholesterol chronic kidney disease.  Denies any suspicious food intake.  Denies any diarrhea.  No weakness numbness tingling.  The history is provided by the patient.       Prior to Admission medications   Medication Sig Start Date End Date Taking? Authorizing Provider  ondansetron  (ZOFRAN -ODT) 4 MG disintegrating tablet Take 1 tablet (4 mg total) by mouth every 8 (eight) hours as needed for nausea or vomiting. 02/17/24  Yes Alyssah Algeo, DO  albuterol  (PROVENTIL  HFA;VENTOLIN  HFA) 108 (90 BASE) MCG/ACT inhaler Inhale 2 puffs into the lungs every 6 (six) hours as needed for wheezing or shortness of breath.    [provider]  buPROPion  (WELLBUTRIN  XL) 300 MG 24 hr tablet Take 300 mg by mouth daily.  11/30/17   [provider]  butalbital -acetaminophen -caffeine  (FIORICET , ESGIC ) 50-325-40 MG tablet Take 1 tablet by mouth 3 (three) times daily as needed for headache or migraine. 03/25/18   [provider]  Cyanocobalamin  (B-12) 1000 MCG SUBL Place 1,000 mcg under the tongue daily. 03/30/22   Thayil, Irene T, PA-C  diclofenac sodium (VOLTAREN) 1 % GEL Apply 2 g topically daily as needed (joint pain).  01/21/18   [provider]  levothyroxine  (SYNTHROID ) 50 MCG tablet Take 50 mcg by mouth every morning.    [provider]  LORazepam  (ATIVAN ) 0.5 MG tablet Take 0.25-0.5 mg by mouth every 8 (eight) hours as needed for anxiety.    [provider]  ondansetron  (ZOFRAN ) 4  MG tablet Take 1 tablet (4 mg total) by mouth as needed for nausea or vomiting. 10/13/22   Wess Hammed, NP  oxyCODONE  (OXY IR/ROXICODONE ) 5 MG immediate release tablet Take 5 mg by mouth 3 (three) times daily as needed. 04/16/22   [provider]  Rimegepant Sulfate (NURTEC) 75 MG TBDP Take 1 tablet (75 mg total) by mouth every other day. 10/13/22   Wess Hammed, NP  rizatriptan  (MAXALT -MLT) 10 MG disintegrating tablet Take 1 tablet (10 mg total) by mouth as needed for migraine. May repeat in 2 hours if needed 10/13/22   Wess Hammed, NP  rosuvastatin  (CRESTOR ) 5 MG tablet Take 5 mg by mouth daily.  02/18/18   [provider]  sertraline  (ZOLOFT ) 100 MG tablet Take 100 mg by mouth every evening.     [provider]  tiZANidine  (ZANAFLEX ) 2 MG tablet Take 1 tablet (2 mg total) by mouth every 8 (eight) hours as needed for muscle spasms. 04/02/22   Wess Hammed, NP  triamcinolone  (NASACORT ) 55 MCG/ACT AERO nasal inhaler Place 1 spray into the nose daily as needed (congestion). 11/16/16   [provider]    Allergies: Atorvastatin, Meperidine , Other, Codeine, Metoclopramide , and Mushroom extract complex (obsolete)    Review of Systems  Updated Vital Signs BP 113/79   Pulse 68   Temp 97.7 F (36.5 C)   Resp 17   Ht 5' 2.5 (1.588 m)   Wt 58.1 kg  SpO2 100%   BMI 23.04 kg/m   Physical Exam Vitals and nursing note reviewed.  Constitutional:      General: She is not in acute distress.    Appearance: She is well-developed. She is not ill-appearing.  HENT:     Head: Normocephalic and atraumatic.     Mouth/Throat:     Mouth: Mucous membranes are moist.   Eyes:     Extraocular Movements: Extraocular movements intact.     Conjunctiva/sclera: Conjunctivae normal.    Cardiovascular:     Rate and Rhythm: Normal rate and regular rhythm.     Heart sounds: Normal heart sounds. No murmur heard. Pulmonary:     Effort: Pulmonary effort is normal. No  respiratory distress.     Breath sounds: Normal breath sounds.  Abdominal:     Palpations: Abdomen is soft.     Tenderness: There is generalized abdominal tenderness.   Musculoskeletal:        General: No swelling.     Cervical back: Neck supple.   Skin:    General: Skin is warm and dry.     Capillary Refill: Capillary refill takes less than 2 seconds.   Neurological:     General: No focal deficit present.     Mental Status: She is alert.   Psychiatric:        Mood and Affect: Mood normal.     (all labs ordered are listed, but only abnormal results are displayed) Labs Reviewed  COMPREHENSIVE METABOLIC PANEL WITH GFR - Abnormal; Notable for the following components:      Result Value   Chloride 112 (*)    CO2 21 (*)    Glucose, Bld 123 (*)    Creatinine, Ser 1.33 (*)    GFR, Estimated 45 (*)    All other components within normal limits  URINALYSIS, ROUTINE W REFLEX MICROSCOPIC - Abnormal; Notable for the following components:   Protein, ur 100 (*)    All other components within normal limits  LIPASE, BLOOD  CBC  TROPONIN I (HIGH SENSITIVITY)  TROPONIN I (HIGH SENSITIVITY)    EKG: None  Radiology: CT ABDOMEN PELVIS W CONTRAST Result Date: 02/17/2024 CLINICAL DATA:  Abdominal pain EXAM: CT ABDOMEN AND PELVIS WITHOUT CONTRAST TECHNIQUE: Multidetector CT imaging of the abdomen and pelvis was performed following the standard protocol without IV contrast. RADIATION DOSE REDUCTION: This exam was performed according to the departmental dose-optimization program which includes automated exposure control, adjustment of the mA and/or kV according to patient size and/or use of iterative reconstruction technique. COMPARISON:  None Available. FINDINGS: Lower chest: No infiltrates or consolidations, no pleural effusions Hepatobiliary: Liver normal size no masses no biliary dilatation. Gallbladder unremarkable. No gallstones. Pancreas: Pancreas normal size. No masses calcifications or  inflammatory changes. Spleen: Spleen normal size.  No masses. Adrenals/Urinary Tract: Adrenal glands are normal size. Follow-up recommended. Kidneys are normal. No masses calcifications or hydronephrosis Stomach/Bowel: No small or large bowel obstruction or inflammatory changes. Moderate amount of residual fecal material throughout the colon without obstruction or constipation. Vascular/Lymphatic: No significant vascular findings are present. No enlarged abdominal or pelvic lymph nodes. Reproductive: .  No masses. Bladder unremarkable. Other: Anterior abdominal wall unremarkable without evidence of umbilical or inguinal hernias Musculoskeletal: Extensive thoracolumbar spine and lumbosacral spine and right sacroiliac fusion. Extensive postsurgical changes with significant beam hardening artifact. IMPRESSION: *No acute findings in the abdomen or pelvis. *Abundant residual amount of residual fecal material throughout the colon without obstruction or constipation. *Extensive thoracolumbar spine  and lumbosacral spine and right sacroiliac fusion. Extensive postsurgical changes with significant beam hardening artifact. Electronically Signed   By: Fredrich Jefferson M.D.   On: 02/17/2024 16:37     Procedures   Medications Ordered in the ED  sodium chloride  0.9 % bolus 1,000 mL (0 mLs Intravenous Stopped 02/17/24 1651)  ondansetron  (ZOFRAN ) injection 4 mg (4 mg Intravenous Given 02/17/24 1534)  fentaNYL  (SUBLIMAZE ) injection 50 mcg (50 mcg Intravenous Given 02/17/24 1535)  iohexol  (OMNIPAQUE ) 350 MG/ML injection 75 mL (75 mLs Intravenous Contrast Given 02/17/24 1627)                                    Medical Decision Making Amount and/or Complexity of Data Reviewed Labs: ordered. Radiology: ordered.  Risk Prescription drug management.   Nicole Morris is here with nausea vomiting abdominal pain.  Normal vitals.  No fever.  History of multiple abdominal surgery.  History of chronic pain CKD.  Differential  diagnosis possibly bowel obstruction versus viral process versus less likely pancreatitis cholecystitis UTI.  Will check CBC CMP lipase urinalysis CT scan abdomen pelvis.  Will give IV fluids IV Zofran  IV fentanyl  and reevaluate.  Lab work per my review interpretation showed no significant leukocytosis anemia or electrolyte abnormality or kidney injury.  CT scan of the abdomen pelvis showed no acute findings.  There is some redundant fecal material but no obvious obstruction or major constipation.  EKG shows sinus rhythm.  Some nonspecific T wave changes anteriorly which were seen on some prior EKGs and she is not having any chest pain.  Troponin normal.  I have no concern for cardiac or pulmonary etiology of her discomfort.  She is feeling better after IV fluids IV nausea meds.  I suspect may be some viral GI process but ultimately workup is unremarkable.  No evidence to suggest pancreatitis cholecystitis or UTI.  Will prescribe Zofran  and have her follow-up with primary care doctor.  Understands return precautions.  This chart was dictated using voice recognition software.  Despite best efforts to proofread,  errors can occur which can change the documentation meaning.      Final diagnoses:  Nausea and vomiting, unspecified vomiting type  Generalized abdominal pain    ED Discharge Orders          Ordered    ondansetron  (ZOFRAN -ODT) 4 MG disintegrating tablet  Every 8 hours PRN        02/17/24 1723               Lowery Rue, DO 02/17/24 2057

## 2024-02-17 NOTE — Discharge Instructions (Signed)
 Take Zofran  as needed for nausea and vomiting.  Please return if symptoms worsen especially if you develop any chest pain or shortness of breath or other concerning symptoms.  Stay well-hydrated.  Follow-up with your primary care doctor.

## 2024-02-17 NOTE — ED Notes (Signed)
 RN called lab, lab states they will run troponin in 15 minutes.

## 2024-02-17 NOTE — ED Notes (Signed)
 Discharge instructions reviewed.   Newly prescribed medications discussed. Pharmacy verified.   Opportunity for questions and concerns provided.   Alert, oriented and ambulatory.   Displays no signs of distress.   Declined discharge vital signs. Said no thanks they should be fine.

## 2024-02-24 ENCOUNTER — Ambulatory Visit (INDEPENDENT_AMBULATORY_CARE_PROVIDER_SITE_OTHER)

## 2024-02-24 DIAGNOSIS — R55 Syncope and collapse: Secondary | ICD-10-CM

## 2024-02-24 LAB — CUP PACEART REMOTE DEVICE CHECK
Date Time Interrogation Session: 20250618230705
Implantable Pulse Generator Implant Date: 20210823

## 2024-03-01 ENCOUNTER — Ambulatory Visit: Payer: Self-pay | Admitting: Cardiovascular Disease

## 2024-03-07 ENCOUNTER — Inpatient Hospital Stay: Attending: Physician Assistant

## 2024-03-07 DIAGNOSIS — Z79899 Other long term (current) drug therapy: Secondary | ICD-10-CM | POA: Insufficient documentation

## 2024-03-07 DIAGNOSIS — E538 Deficiency of other specified B group vitamins: Secondary | ICD-10-CM | POA: Insufficient documentation

## 2024-03-07 MED ORDER — CYANOCOBALAMIN 1000 MCG/ML IJ SOLN
1000.0000 ug | Freq: Once | INTRAMUSCULAR | Status: AC
  Administered 2024-03-07: 1000 ug via INTRAMUSCULAR
  Filled 2024-03-07: qty 1

## 2024-03-14 ENCOUNTER — Other Ambulatory Visit: Payer: Self-pay | Admitting: Family Medicine

## 2024-03-14 DIAGNOSIS — I7121 Aneurysm of the ascending aorta, without rupture: Secondary | ICD-10-CM

## 2024-03-14 NOTE — Progress Notes (Signed)
 Carelink Summary Report / Loop Recorder

## 2024-03-17 ENCOUNTER — Ambulatory Visit
Admission: RE | Admit: 2024-03-17 | Discharge: 2024-03-17 | Disposition: A | Discharge status: Home / Self Care | Source: Ambulatory Visit | Attending: Family Medicine | Admitting: Family Medicine

## 2024-03-17 DIAGNOSIS — I7121 Aneurysm of the ascending aorta, without rupture: Secondary | ICD-10-CM

## 2024-03-17 MED ORDER — IOPAMIDOL (ISOVUE-370) INJECTION 76%
50.0000 mL | Freq: Once | INTRAVENOUS | Status: AC | PRN
  Administered 2024-03-17: 50 mL via INTRAVENOUS

## 2024-03-27 ENCOUNTER — Ambulatory Visit

## 2024-03-27 DIAGNOSIS — R55 Syncope and collapse: Secondary | ICD-10-CM | POA: Diagnosis not present

## 2024-03-28 ENCOUNTER — Ambulatory Visit: Payer: Self-pay | Admitting: Cardiovascular Disease

## 2024-03-28 LAB — CUP PACEART REMOTE DEVICE CHECK
Date Time Interrogation Session: 20250720230523
Implantable Pulse Generator Implant Date: 20210823

## 2024-04-03 ENCOUNTER — Ambulatory Visit

## 2024-04-03 VITALS — BP 133/78 | HR 69 | Resp 18 | Ht 62.0 in | Wt 131.0 lb

## 2024-04-03 DIAGNOSIS — I7121 Aneurysm of the ascending aorta, without rupture: Secondary | ICD-10-CM

## 2024-04-03 NOTE — Patient Instructions (Signed)
 Risk Modification in those with ascending thoracic aortic aneurysm:   Continue control of blood pressure (prefer BP 130/80 or less)   2. Avoid fluoroquinolone antibiotics (I.e Ciprofloxacin, Avelox, Levofloxacin, Ofloxacin)   3.  Use of statin (to decrease cardiovascular risk)   4.  Exercise and activity limitations is individualized, but in general, contact sports are to be avoided and one should avoid heavy lifting (defined as half of ideal body weight) and exercises involving sustained Valsalva maneuver.   5.  Follow-up in 6 month with CT chest.  OK to use a non-contrast CT if you have had a recent study for surveillance of the lung nodule.

## 2024-04-03 NOTE — Progress Notes (Signed)
 [address] [place] [address]             [phone]            Nicole Morris [id number] December 04, 1960   History of Present Illness:  Ms. Nicole Morris is a 63 year old female with medical history of chronic migraine, asthma, scoliosis, hypercholesteremia, anxiety, depression, stage 3a chronic kidney disease and vitamin B12 deficiency presents for an initial encounter of ascending thoracic aortic aneurysm. CTA of chest on 03/2024 measured aneurysm at 4.5 cm.  After review of external notes and imaging, found ascending thoracic aortic aneurysm in 2023 CT scan of neck which measured at 4.2 cm.   She does walk for exercise around 1 mile each day.  She does not do any heavy lifting.  Her blood pressure is well controlled without the use of medications. She does not have a family history of connective tissue disorders but is unsure about herself.  She has been told by her orthopedic provider that she has ligament laxity and is very flexible.  She also experiences syncopal episodes when she stands to quickly.      Current Outpatient Medications on File Prior to Visit  Medication Sig Dispense Refill   albuterol  (PROVENTIL  HFA;VENTOLIN  HFA) 108 (90 BASE) MCG/ACT inhaler Inhale 2 puffs into the lungs every 6 (six) hours as needed for wheezing or shortness of breath.     buPROPion  (WELLBUTRIN  XL) 300 MG 24 hr tablet Take 300 mg by mouth daily.      butalbital -acetaminophen -caffeine  (FIORICET , ESGIC ) 50-325-40 MG tablet Take 1 tablet by mouth 3 (three) times daily as needed for headache or migraine.     Cyanocobalamin  (B-12) 1000 MCG SUBL Place 1,000 mcg under the tongue daily. 30 tablet 3   diclofenac sodium (VOLTAREN) 1 % GEL Apply 2 g topically daily as needed (joint pain).      levothyroxine  (SYNTHROID ) 50 MCG tablet Take 50 mcg by mouth every morning.     LORazepam  (ATIVAN ) 0.5 MG tablet Take 0.25-0.5 mg by mouth every 8 (eight) hours as needed for  anxiety.     ondansetron  (ZOFRAN ) 4 MG tablet Take 1 tablet (4 mg total) by mouth as needed for nausea or vomiting. 15 tablet 5   ondansetron  (ZOFRAN -ODT) 4 MG disintegrating tablet Take 1 tablet (4 mg total) by mouth every 8 (eight) hours as needed for nausea or vomiting. 20 tablet 0   oxyCODONE  (OXY IR/ROXICODONE ) 5 MG immediate release tablet Take 5 mg by mouth 3 (three) times daily as needed.     pantoprazole  (PROTONIX ) 20 MG tablet Take 1 tablet (20 mg total) by mouth daily for 14 days. 14 tablet 0   Rimegepant Sulfate (NURTEC) 75 MG TBDP Take 1 tablet (75 mg total) by mouth every other day. 16 tablet 11   rizatriptan  (MAXALT -MLT) 10 MG disintegrating tablet Take 1 tablet (10 mg total) by mouth as needed for migraine. May repeat in 2 hours if needed 10 tablet 6   rosuvastatin  (CRESTOR ) 5 MG tablet Take 5 mg by mouth daily.      sertraline  (ZOLOFT ) 100 MG tablet Take 100 mg by mouth every evening.      tiZANidine  (ZANAFLEX ) 2 MG tablet Take 1 tablet (2 mg total) by mouth every 8 (eight) hours as needed for muscle spasms. 30 tablet 3   triamcinolone  (NASACORT ) 55 MCG/ACT AERO nasal inhaler Place 1 spray into the nose daily as needed (  congestion).     No current facility-administered medications on file prior to visit.     ROS: Review of Systems  Constitutional: Negative.  Negative for chills and fever.  Respiratory: Negative.  Negative for cough and wheezing.   Cardiovascular: Negative.  Negative for palpitations and leg swelling.  Neurological:  Positive for dizziness and headaches.       When standing     BP 133/78   Pulse 69   Resp 18   Ht 5' 2 (1.575 m)   Wt 131 lb (59.4 kg)   SpO2 99%   BMI 23.96 kg/m   Physical Exam Constitutional:      Appearance: Normal appearance.  HENT:     Head: Normocephalic and atraumatic.  Cardiovascular:     Rate and Rhythm: Normal rate and regular rhythm.     Heart sounds: Normal heart sounds, S1 normal and S2 normal.  Pulmonary:      Effort: Pulmonary effort is normal.     Breath sounds: Normal breath sounds.  Musculoskeletal:     Cervical back: Normal range of motion.  Skin:    General: Skin is warm and dry.  Neurological:     General: No focal deficit present.     Mental Status: She is alert.      Imaging: CLINICAL DATA:  follow up on ascending aortic aneurysm   EXAM: CT ANGIOGRAPHY CHEST WITH CONTRAST   TECHNIQUE: Multidetector CT imaging of the chest was performed using the standard protocol during bolus administration of intravenous contrast. Multiplanar CT image reconstructions and MIPs were obtained to evaluate the vascular anatomy.   RADIATION DOSE REDUCTION: This exam was performed according to the departmental dose-optimization program which includes automated exposure control, adjustment of the mA and/or kV according to patient size and/or use of iterative reconstruction technique.   CONTRAST:  50mL ISOVUE -370 IOPAMIDOL  (ISOVUE -370) INJECTION 76%   COMPARISON:  CTA chest, 01/15/2020.  Chest XR, 01/15/2020   FINDINGS: CARDIOVASCULAR:   Limitations by motion: Moderate   Preferential opacification of the thoracic aorta. No evidence of thoracic aortic dissection.   Aortic Root: Motion degraded, measures approximately.   --Valve: 2.5 cm   --Sinuses: 4.1 cm   --Sinotubular Junction: 3.2 cm   Thoracic Aorta:   --Ascending Aorta: 4.5 cm   --Aortic Arch: 2.7 cm   --Descending Aorta: 2.8 cm   Other:   Normal heart size.  No pericardial effusion.   LEFT aortic arch and common bovine variant, with shared origin of the brachiocephalic and LEFT common carotid arteries.   No atherosclerosis or hemodynamic stenosis at the origins of the great vessels.   Mediastinum/Nodes: No enlarged mediastinal, hilar, or axillary lymph nodes. Thyroid  gland, trachea, and esophagus demonstrate no significant findings.   Lungs/Pleura: Lungs are clear. No pleural effusion or pneumothorax.    Upper Abdomen: Hilar splenic artery aneurysms (2), largest measuring up to 2.2 x 1.6 x 2.0 cm (AP by transaxial by CC).   Musculoskeletal: No acute chest wall abnormality. Rotoscoliosis with thoracolumbar fusion, incompletely imaged. No acute osseous findings.   Review of the MIP images confirms the above findings.   IMPRESSION: 1. 4.5 cm dilatation of the ascending thoracic aorta. Ascending thoracic aortic aneurysm. Recommend semi-annual imaging followup by CTA or MRA and referral to cardiothoracic surgery if not already obtained. This recommendation follows 2010 ACCF/AHA/AATS/ACR/ASA/SCA/SCAI/SIR/STS/SVM Guidelines for the Diagnosis and Management of Patients With Thoracic Aortic Disease. Circulation. 2010; 121: Z733-z630. Aortic aneurysm NOS (ICD10-I71.9) 2. Incidental, 2.2 cm splenic artery aneurysm. Treatment  recommended for visceral artery aneurysms > 2 cm given increased risk for spontaneous rupture. Recommend referral to Vascular Interventional Radiology (VIR) out for further evaluation.   These results will be called to the ordering clinician or representative by the Radiologist Assistant, and communication documented in the PACS or [employer].   Thom Hall, MD   [facility]   [facility]     Electronically Signed   By: Thom Hall M.D.   On: 03/18/2024 11:02     A/P: Aneurysm of ascending aorta without rupture (HCC) -4.5 cm ascending thoracic aortic aneurysm on CTA of chest.  We discussed the natural history and and risk factors for growth of ascending aortic aneurysms. Discussed recommendations to minimize the risk of further expansion or dissection including careful blood pressure control, avoidance of contact sports and heavy lifting, attention to lipid management.  We covered the importance of staying never user of tobacco.  The patient does not yet meet surgical criteria of >5.5cm. The patient is aware  of signs and symptoms of aortic dissection and when to present to the emergency department.  Also discussed that she may consider genetic testing with her PCP for connective tissue disorder due to flexibility, syncopal episodes upon standing and ascending aortic aneurysm diagnosis.   -Follow up in 6 months with CTA of chest  Splenic Artery Aneurysm -Splenic artery aneurysm on CTA and she has been referred to interventional radiology by PCP for this already   Risk Modification:  Statin:  rosuvastatin  5 mg  Smoking cessation instruction/counseling given:  never user  Patient was counseled on importance of Blood Pressure Control  They are instructed to contact their Primary Care Physician if they start to have blood pressure readings over 130s/90s. Do not ever stop blood pressure medications on your own, unless instructed by healthcare professional.  Please avoid use of Fluoroquinolones as this can potentially increase your risk of Aortic Rupture and/or Dissection  Patient educated on signs and symptoms of Aortic Dissection, handout also provided in AVS  Nicole CHRISTELLA Rough, PA-C 04/03/24

## 2024-04-04 ENCOUNTER — Inpatient Hospital Stay

## 2024-04-04 DIAGNOSIS — E538 Deficiency of other specified B group vitamins: Secondary | ICD-10-CM

## 2024-04-04 MED ORDER — CYANOCOBALAMIN 1000 MCG/ML IJ SOLN
1000.0000 ug | Freq: Once | INTRAMUSCULAR | Status: AC
  Administered 2024-04-04: 1000 ug via INTRAMUSCULAR
  Filled 2024-04-04: qty 1

## 2024-04-19 ENCOUNTER — Emergency Department (HOSPITAL_COMMUNITY)

## 2024-04-19 ENCOUNTER — Encounter (HOSPITAL_COMMUNITY): Payer: Self-pay | Admitting: *Deleted

## 2024-04-19 ENCOUNTER — Inpatient Hospital Stay (HOSPITAL_COMMUNITY)
Admission: EM | Admit: 2024-04-19 | Discharge: 2024-04-25 | DRG: 389 | Disposition: A | Discharge status: Home / Self Care | Attending: Internal Medicine | Admitting: Internal Medicine

## 2024-04-19 ENCOUNTER — Other Ambulatory Visit: Payer: Self-pay

## 2024-04-19 DIAGNOSIS — Z981 Arthrodesis status: Secondary | ICD-10-CM

## 2024-04-19 DIAGNOSIS — Z888 Allergy status to other drugs, medicaments and biological substances status: Secondary | ICD-10-CM

## 2024-04-19 DIAGNOSIS — I700 Atherosclerosis of aorta: Secondary | ICD-10-CM | POA: Diagnosis present

## 2024-04-19 DIAGNOSIS — F419 Anxiety disorder, unspecified: Secondary | ICD-10-CM | POA: Diagnosis present

## 2024-04-19 DIAGNOSIS — Z90710 Acquired absence of both cervix and uterus: Secondary | ICD-10-CM

## 2024-04-19 DIAGNOSIS — E7800 Pure hypercholesterolemia, unspecified: Secondary | ICD-10-CM | POA: Diagnosis present

## 2024-04-19 DIAGNOSIS — F411 Generalized anxiety disorder: Secondary | ICD-10-CM | POA: Diagnosis present

## 2024-04-19 DIAGNOSIS — Z8709 Personal history of other diseases of the respiratory system: Secondary | ICD-10-CM

## 2024-04-19 DIAGNOSIS — Z9889 Other specified postprocedural states: Secondary | ICD-10-CM

## 2024-04-19 DIAGNOSIS — Z79899 Other long term (current) drug therapy: Secondary | ICD-10-CM

## 2024-04-19 DIAGNOSIS — M419 Scoliosis, unspecified: Secondary | ICD-10-CM | POA: Diagnosis present

## 2024-04-19 DIAGNOSIS — E039 Hypothyroidism, unspecified: Secondary | ICD-10-CM | POA: Diagnosis present

## 2024-04-19 DIAGNOSIS — N179 Acute kidney failure, unspecified: Secondary | ICD-10-CM | POA: Diagnosis present

## 2024-04-19 DIAGNOSIS — G43909 Migraine, unspecified, not intractable, without status migrainosus: Secondary | ICD-10-CM | POA: Diagnosis present

## 2024-04-19 DIAGNOSIS — N1832 Chronic kidney disease, stage 3b: Secondary | ICD-10-CM | POA: Diagnosis present

## 2024-04-19 DIAGNOSIS — Z800 Family history of malignant neoplasm of digestive organs: Secondary | ICD-10-CM

## 2024-04-19 DIAGNOSIS — K5651 Intestinal adhesions [bands], with partial obstruction: Secondary | ICD-10-CM | POA: Diagnosis not present

## 2024-04-19 DIAGNOSIS — K56609 Unspecified intestinal obstruction, unspecified as to partial versus complete obstruction: Secondary | ICD-10-CM | POA: Diagnosis not present

## 2024-04-19 DIAGNOSIS — Z801 Family history of malignant neoplasm of trachea, bronchus and lung: Secondary | ICD-10-CM

## 2024-04-19 DIAGNOSIS — Z885 Allergy status to narcotic agent status: Secondary | ICD-10-CM

## 2024-04-19 DIAGNOSIS — N390 Urinary tract infection, site not specified: Secondary | ICD-10-CM | POA: Diagnosis present

## 2024-04-19 DIAGNOSIS — R0602 Shortness of breath: Secondary | ICD-10-CM | POA: Diagnosis present

## 2024-04-19 DIAGNOSIS — J45909 Unspecified asthma, uncomplicated: Secondary | ICD-10-CM | POA: Diagnosis present

## 2024-04-19 DIAGNOSIS — Z79890 Hormone replacement therapy (postmenopausal): Secondary | ICD-10-CM

## 2024-04-19 DIAGNOSIS — Z8719 Personal history of other diseases of the digestive system: Secondary | ICD-10-CM

## 2024-04-19 DIAGNOSIS — I728 Aneurysm of other specified arteries: Secondary | ICD-10-CM | POA: Diagnosis present

## 2024-04-19 DIAGNOSIS — G8929 Other chronic pain: Secondary | ICD-10-CM | POA: Diagnosis present

## 2024-04-19 DIAGNOSIS — Z96642 Presence of left artificial hip joint: Secondary | ICD-10-CM | POA: Diagnosis present

## 2024-04-19 DIAGNOSIS — N183 Chronic kidney disease, stage 3 unspecified: Secondary | ICD-10-CM | POA: Diagnosis present

## 2024-04-19 LAB — URINALYSIS, ROUTINE W REFLEX MICROSCOPIC
Bilirubin Urine: NEGATIVE
Glucose, UA: NEGATIVE mg/dL
Hgb urine dipstick: NEGATIVE
Ketones, ur: NEGATIVE mg/dL
Nitrite: NEGATIVE
Protein, ur: 100 mg/dL — AB
Specific Gravity, Urine: 1.011 (ref 1.005–1.030)
pH: 6 (ref 5.0–8.0)

## 2024-04-19 LAB — COMPREHENSIVE METABOLIC PANEL WITH GFR
ALT: 13 U/L (ref 0–44)
AST: 19 U/L (ref 15–41)
Albumin: 4 g/dL (ref 3.5–5.0)
Alkaline Phosphatase: 81 U/L (ref 38–126)
Anion gap: 12 (ref 5–15)
BUN: 22 mg/dL (ref 8–23)
CO2: 20 mmol/L — ABNORMAL LOW (ref 22–32)
Calcium: 9.5 mg/dL (ref 8.9–10.3)
Chloride: 110 mmol/L (ref 98–111)
Creatinine, Ser: 1.34 mg/dL — ABNORMAL HIGH (ref 0.44–1.00)
GFR, Estimated: 45 mL/min — ABNORMAL LOW (ref >60)
Glucose, Bld: 129 mg/dL — ABNORMAL HIGH (ref 70–99)
Potassium: 4.1 mmol/L (ref 3.5–5.1)
Sodium: 142 mmol/L (ref 135–145)
Total Bilirubin: 0.7 mg/dL (ref 0.0–1.2)
Total Protein: 7.3 g/dL (ref 6.5–8.1)

## 2024-04-19 LAB — CBC
HCT: 40.3 % (ref 36.0–46.0)
Hemoglobin: 13.5 g/dL (ref 12.0–15.0)
MCH: 31 pg (ref 26.0–34.0)
MCHC: 33.5 g/dL (ref 30.0–36.0)
MCV: 92.6 fL (ref 80.0–100.0)
Platelets: 221 K/uL (ref 150–400)
RBC: 4.35 MIL/uL (ref 3.87–5.11)
RDW: 13 % (ref 11.5–15.5)
WBC: 7.3 K/uL (ref 4.0–10.5)
nRBC: 0 % (ref 0.0–0.2)

## 2024-04-19 LAB — LIPASE, BLOOD: Lipase: 36 U/L (ref 11–51)

## 2024-04-19 MED ORDER — ONDANSETRON 4 MG PO TBDP
4.0000 mg | ORAL_TABLET | Freq: Once | ORAL | Status: AC | PRN
  Administered 2024-04-19 (×2): 4 mg via ORAL
  Filled 2024-04-19: qty 1

## 2024-04-19 MED ORDER — IOHEXOL 350 MG/ML SOLN
75.0000 mL | Freq: Once | INTRAVENOUS | Status: AC | PRN
  Administered 2024-04-19 (×2): 75 mL via INTRAVENOUS

## 2024-04-19 MED ORDER — OXYCODONE-ACETAMINOPHEN 5-325 MG PO TABS
1.0000 | ORAL_TABLET | Freq: Once | ORAL | Status: AC
  Administered 2024-04-19 (×2): 1 via ORAL
  Filled 2024-04-19: qty 1

## 2024-04-19 NOTE — ED Triage Notes (Signed)
 The pt has a headache and abd pain with nausea and vomiting  since yesterday

## 2024-04-19 NOTE — ED Provider Triage Note (Signed)
 Emergency Medicine Provider Triage Evaluation Note  Bryahna Lesko , a 63 y.o. female  was evaluated in triage.  Pt complains of abdominal pain, nausea, vomiting. Reports feels like last time she had a bowel obstruction.  Review of Systems  Positive: Abdominal pain, NV Negative:   Physical Exam  BP 122/87   Pulse (!) 105   Temp 98.6 F (37 C)   Resp (!) 22   Ht 5' 2 (1.575 m)   Wt 59 kg   SpO2 99%   BMI 23.78 kg/m  Gen:   Awake, no distress   Resp:  Normal effort  MSK:   Moves extremities without difficulty  Other:  Focal ttp in RUQ and RLQ, some guarding  Medical Decision Making  Medically screening exam initiated at 8:12 PM.  Appropriate orders placed.  Jamieson Page-Shinn was informed that the remainder of the evaluation will be completed by another provider, this initial triage assessment does not replace that evaluation, and the importance of remaining in the ED until their evaluation is complete.  Workup initiated in triage    Rosan Sherlean DEL, [place] 04/19/24 2019

## 2024-04-20 ENCOUNTER — Inpatient Hospital Stay (HOSPITAL_COMMUNITY)

## 2024-04-20 ENCOUNTER — Encounter (HOSPITAL_COMMUNITY): Payer: Self-pay | Admitting: Internal Medicine

## 2024-04-20 DIAGNOSIS — F419 Anxiety disorder, unspecified: Secondary | ICD-10-CM | POA: Diagnosis present

## 2024-04-20 DIAGNOSIS — E7800 Pure hypercholesterolemia, unspecified: Secondary | ICD-10-CM | POA: Diagnosis present

## 2024-04-20 DIAGNOSIS — Z8719 Personal history of other diseases of the digestive system: Secondary | ICD-10-CM | POA: Diagnosis not present

## 2024-04-20 DIAGNOSIS — I728 Aneurysm of other specified arteries: Secondary | ICD-10-CM | POA: Diagnosis present

## 2024-04-20 DIAGNOSIS — G43909 Migraine, unspecified, not intractable, without status migrainosus: Secondary | ICD-10-CM | POA: Diagnosis present

## 2024-04-20 DIAGNOSIS — Z8709 Personal history of other diseases of the respiratory system: Secondary | ICD-10-CM

## 2024-04-20 DIAGNOSIS — E039 Hypothyroidism, unspecified: Secondary | ICD-10-CM | POA: Diagnosis present

## 2024-04-20 DIAGNOSIS — N390 Urinary tract infection, site not specified: Secondary | ICD-10-CM | POA: Diagnosis present

## 2024-04-20 DIAGNOSIS — N179 Acute kidney failure, unspecified: Secondary | ICD-10-CM | POA: Diagnosis present

## 2024-04-20 DIAGNOSIS — Z801 Family history of malignant neoplasm of trachea, bronchus and lung: Secondary | ICD-10-CM | POA: Diagnosis not present

## 2024-04-20 DIAGNOSIS — Z96642 Presence of left artificial hip joint: Secondary | ICD-10-CM | POA: Diagnosis present

## 2024-04-20 DIAGNOSIS — Z79899 Other long term (current) drug therapy: Secondary | ICD-10-CM | POA: Diagnosis not present

## 2024-04-20 DIAGNOSIS — E038 Other specified hypothyroidism: Secondary | ICD-10-CM

## 2024-04-20 DIAGNOSIS — Z981 Arthrodesis status: Secondary | ICD-10-CM | POA: Diagnosis not present

## 2024-04-20 DIAGNOSIS — K5651 Intestinal adhesions [bands], with partial obstruction: Secondary | ICD-10-CM | POA: Diagnosis present

## 2024-04-20 DIAGNOSIS — R0602 Shortness of breath: Secondary | ICD-10-CM | POA: Diagnosis present

## 2024-04-20 DIAGNOSIS — Z90710 Acquired absence of both cervix and uterus: Secondary | ICD-10-CM | POA: Diagnosis not present

## 2024-04-20 DIAGNOSIS — J45909 Unspecified asthma, uncomplicated: Secondary | ICD-10-CM | POA: Diagnosis present

## 2024-04-20 DIAGNOSIS — N1832 Chronic kidney disease, stage 3b: Secondary | ICD-10-CM

## 2024-04-20 DIAGNOSIS — M419 Scoliosis, unspecified: Secondary | ICD-10-CM | POA: Diagnosis present

## 2024-04-20 DIAGNOSIS — Z9889 Other specified postprocedural states: Secondary | ICD-10-CM

## 2024-04-20 DIAGNOSIS — Z885 Allergy status to narcotic agent status: Secondary | ICD-10-CM | POA: Diagnosis not present

## 2024-04-20 DIAGNOSIS — K56609 Unspecified intestinal obstruction, unspecified as to partial versus complete obstruction: Secondary | ICD-10-CM | POA: Diagnosis present

## 2024-04-20 DIAGNOSIS — Z79890 Hormone replacement therapy (postmenopausal): Secondary | ICD-10-CM | POA: Diagnosis not present

## 2024-04-20 DIAGNOSIS — F411 Generalized anxiety disorder: Secondary | ICD-10-CM | POA: Diagnosis present

## 2024-04-20 DIAGNOSIS — G8929 Other chronic pain: Secondary | ICD-10-CM | POA: Diagnosis present

## 2024-04-20 DIAGNOSIS — Z800 Family history of malignant neoplasm of digestive organs: Secondary | ICD-10-CM | POA: Diagnosis not present

## 2024-04-20 DIAGNOSIS — Z888 Allergy status to other drugs, medicaments and biological substances status: Secondary | ICD-10-CM | POA: Diagnosis not present

## 2024-04-20 DIAGNOSIS — I700 Atherosclerosis of aorta: Secondary | ICD-10-CM | POA: Diagnosis present

## 2024-04-20 LAB — BASIC METABOLIC PANEL WITH GFR
Anion gap: 9 (ref 5–15)
BUN: 20 mg/dL (ref 8–23)
CO2: 21 mmol/L — ABNORMAL LOW (ref 22–32)
Calcium: 9.3 mg/dL (ref 8.9–10.3)
Chloride: 110 mmol/L (ref 98–111)
Creatinine, Ser: 1.35 mg/dL — ABNORMAL HIGH (ref 0.44–1.00)
GFR, Estimated: 44 mL/min — ABNORMAL LOW (ref >60)
Glucose, Bld: 109 mg/dL — ABNORMAL HIGH (ref 70–99)
Potassium: 3.5 mmol/L (ref 3.5–5.1)
Sodium: 140 mmol/L (ref 135–145)

## 2024-04-20 LAB — CBC
HCT: 39.2 % (ref 36.0–46.0)
Hemoglobin: 12.9 g/dL (ref 12.0–15.0)
MCH: 30.9 pg (ref 26.0–34.0)
MCHC: 32.9 g/dL (ref 30.0–36.0)
MCV: 93.8 fL (ref 80.0–100.0)
Platelets: 198 K/uL (ref 150–400)
RBC: 4.18 MIL/uL (ref 3.87–5.11)
RDW: 13.3 % (ref 11.5–15.5)
WBC: 5.5 K/uL (ref 4.0–10.5)
nRBC: 0 % (ref 0.0–0.2)

## 2024-04-20 LAB — TROPONIN I (HIGH SENSITIVITY)
Troponin I (High Sensitivity): 3 ng/L (ref <18)
Troponin I (High Sensitivity): 3 ng/L (ref <18)

## 2024-04-20 MED ORDER — SODIUM CHLORIDE 0.9 % IV SOLN: 250.0000 mL | INTRAVENOUS | Status: AC | PRN

## 2024-04-20 MED ORDER — IPRATROPIUM-ALBUTEROL 0.5-2.5 (3) MG/3ML IN SOLN: 3.0000 mL | RESPIRATORY_TRACT | Status: DC | PRN

## 2024-04-20 MED ORDER — SODIUM CHLORIDE 0.9% FLUSH
3.0000 mL | Freq: Two times a day (BID) | INTRAVENOUS | Status: DC
  Administered 2024-04-20 – 2024-04-22 (×3): 3 mL via INTRAVENOUS

## 2024-04-20 MED ORDER — ONDANSETRON HCL 4 MG PO TABS: 4.0000 mg | ORAL_TABLET | Freq: Four times a day (QID) | ORAL | Status: DC | PRN

## 2024-04-20 MED ORDER — ONDANSETRON HCL 4 MG/2ML IJ SOLN
4.0000 mg | Freq: Once | INTRAMUSCULAR | Status: AC
  Administered 2024-04-20: 4 mg via INTRAVENOUS
  Filled 2024-04-20: qty 2

## 2024-04-20 MED ORDER — DIATRIZOATE MEGLUMINE & SODIUM 66-10 % PO SOLN
90.0000 mL | Freq: Once | ORAL | Status: AC
  Administered 2024-04-20: 90 mL via NASOGASTRIC
  Filled 2024-04-20: qty 90

## 2024-04-20 MED ORDER — ONDANSETRON HCL 4 MG/2ML IJ SOLN
4.0000 mg | Freq: Four times a day (QID) | INTRAMUSCULAR | Status: DC | PRN
Start: 2024-04-20 — End: 2024-04-20

## 2024-04-20 MED ORDER — ALBUTEROL SULFATE (2.5 MG/3ML) 0.083% IN NEBU: 2.5000 mg | INHALATION_SOLUTION | RESPIRATORY_TRACT | Status: DC | PRN

## 2024-04-20 MED ORDER — SENNOSIDES-DOCUSATE SODIUM 8.6-50 MG PO TABS: 1.0000 | ORAL_TABLET | Freq: Every evening | ORAL | Status: DC | PRN

## 2024-04-20 MED ORDER — SODIUM CHLORIDE 0.9% FLUSH: 3.0000 mL | INTRAVENOUS | Status: DC | PRN

## 2024-04-20 MED ORDER — GLUCAGON HCL RDNA (DIAGNOSTIC) 1 MG IJ SOLR: 1.0000 mg | INTRAMUSCULAR | Status: DC | PRN

## 2024-04-20 MED ORDER — ONDANSETRON HCL 4 MG/2ML IJ SOLN
4.0000 mg | Freq: Four times a day (QID) | INTRAMUSCULAR | Status: DC | PRN
  Administered 2024-04-20 – 2024-04-22 (×6): 4 mg via INTRAVENOUS
  Filled 2024-04-20 (×7): qty 2

## 2024-04-20 MED ORDER — METOPROLOL TARTRATE 5 MG/5ML IV SOLN: 5.0000 mg | INTRAVENOUS | Status: DC | PRN

## 2024-04-20 MED ORDER — HYDROMORPHONE HCL 1 MG/ML IJ SOLN
0.5000 mg | Freq: Once | INTRAMUSCULAR | Status: AC
  Administered 2024-04-20: 0.5 mg via INTRAVENOUS
  Filled 2024-04-20: qty 1

## 2024-04-20 MED ORDER — DEXTROSE IN LACTATED RINGERS 5 % IV SOLN: INTRAVENOUS | Status: AC

## 2024-04-20 MED ORDER — LACTATED RINGERS IV SOLN: INTRAVENOUS | Status: DC

## 2024-04-20 MED ORDER — HYDRALAZINE HCL 20 MG/ML IJ SOLN: 10.0000 mg | INTRAMUSCULAR | Status: DC | PRN

## 2024-04-20 MED ORDER — HYDROMORPHONE HCL 1 MG/ML IJ SOLN
0.5000 mg | INTRAMUSCULAR | Status: DC | PRN
  Administered 2024-04-20 – 2024-04-21 (×2): 1 mg via INTRAVENOUS
  Filled 2024-04-20 (×2): qty 1

## 2024-04-20 MED ORDER — GUAIFENESIN 100 MG/5ML PO LIQD: 5.0000 mL | ORAL | Status: DC | PRN

## 2024-04-20 NOTE — TOC Initial Note (Signed)
 Transition of Care [facility]) - Initial/Assessment Note    Patient Details  Name: Nicole Morris MRN: [id number] Date of Birth: 11/11/1960  [facility] CM/SW Contact:    Jeoffrey LITTIE Moose, LCSW Phone Number: 04/20/2024, 9:26 AM  Clinical Narrative:                 Pt admitted from home due to abdominal pain, nausea and vomiting and headache. No current TOC needs, please consult as needs arise.         Patient Goals and CMS Choice            Expected Discharge Plan and Services                                              Prior Living Arrangements/Services                       Activities of Daily Living      Permission Sought/Granted                  Emotional Assessment              Admission diagnosis:  Small bowel obstruction (HCC) [K56.609] SBO (small bowel obstruction) (HCC) [K56.609] SOB (shortness of breath) [R06.02] Patient Active Problem List   Diagnosis Date Noted   SBO (small bowel obstruction) (HCC) 04/20/2024   History of back surgery 04/20/2024   History of asthma 04/20/2024   GAD (generalized anxiety disorder) 04/20/2024   Vitamin B12 deficiency 07/01/2022   Anxiety and depression 03/13/2022   Migraine 03/13/2022   Hypothyroidism 03/13/2022   Chronic pain syndrome 03/13/2022   Thoracic aortic aneurysm (HCC) 03/13/2022   Normocytic anemia 03/13/2022   Dental infection 03/13/2022   Atypical chest pain 03/13/2022   Hypertensive urgency 03/13/2022   Ludwig's angina 03/12/2022   Spina bifida without hydrocephalus (HCC) 04/01/2021   Depression 04/01/2021   Scoliosis 04/01/2021   Loss of consciousness (HCC) 09/30/2020   Intractable chronic migraine without aura and without status migrainosus 09/30/2020   Idiopathic scoliosis 09/30/2020   Syncope 03/26/2020   Exertional dyspnea 03/26/2020   Chest pain at rest 03/26/2020   Hypercholesterolemia 03/26/2020   CKD (chronic kidney disease) stage 3, GFR 30-59  ml/min (HCC) 03/26/2020   Neurogenic bladder 03/26/2020   Congenital ureteral obstruction 03/26/2020   Scoliosis of thoracolumbar spine 03/26/2020   Status post total replacement of left hip 06/14/2018   Unilateral primary osteoarthritis, left hip 04/26/2018   Epicondylitis elbow, medial 12/17/2011   Lateral epicondylitis 01/09/2011   BURSITIS, HIP 07/22/2010   LEG PAIN, RIGHT 05/22/2010   UNEQUAL LEG LENGTH 05/22/2010   SCOLIOSIS, LUMBAR SPINE 05/22/2010   ABNORMALITY OF GAIT 05/22/2010   PCP:  Teresa Channel, MD Pharmacy:   [facility] [phone] - [place], [place] - [address] AT [facility] [address] [place] [address] Phone: [phone] Fax: [phone]     Social Drivers of Health (SDOH) Social History: SDOH Screenings   Food Insecurity: No Food Insecurity (08/24/2023)   Received from [facility]  Housing: Low Risk  (08/24/2023)   Received from [facility]  Transportation Needs: Unknown (08/24/2023)   Received from [facility]  Utilities: Not At Risk (08/24/2023)   Received from [facility]  Financial Resource Strain:  Low Risk  (08/24/2023)   Received from [facility]  Tobacco Use: Low Risk  (04/20/2024)   SDOH Interventions:     Readmission Risk Interventions     No data to display

## 2024-04-20 NOTE — ED Notes (Signed)
 Gastrografin  sent upstairs with pt and IP RN aware medication due to be administered following NG xray verification.

## 2024-04-20 NOTE — ED Provider Notes (Signed)
 Emergency Department Provider Note   I have reviewed the triage vital signs and the nursing notes.   HISTORY  Chief Complaint Abdominal Pain   HPI Nicole Morris is a 63 y.o. female with past history of abdominal surgery and 1 prior episode of bowel obstruction presents emergency department with abdominal pain and vomiting.  Symptoms have developed over the past 24 hours.  She describes some abdominal fullness worsening to pain with vomiting.  Since she began vomiting, she has not passed either stool or flatus.  She recalls a time and 2009-2010 where she had a episode of bowel obstruction.  Does not recall having to have surgery for lysis of adhesions.  Does not recall NG tube placement at that time. No fever. No active CP or SOB.   Past Medical History:  Diagnosis Date   Anxiety    Asthma    Chronic lower back pain    Dysrhythmia    pt got Tachy last time under anesthesia   High cholesterol    History of blood transfusion    related to surgeries (06/14/2018)   Hypothyroidism    Kidney disease, chronic, stage II (GFR 60-89 ml/min)    one of my kidneys is in my abdomen (06/14/2018)   Migraine    daily-weekly (06/14/2018)   Neuromuscular disorder (HCC)    neuropathy in legs from back surgery   PONV (postoperative nausea and vomiting)    Scoliosis     Review of Systems  Constitutional: No fever/chills Cardiovascular: Denies chest pain. Respiratory: Denies shortness of breath. Gastrointestinal: Positive abdominal pain. Positive nausea and vomiting.  No diarrhea.  No constipation. Skin: Negative for rash. Neurological: Negative for headache.  ____________________________________________   PHYSICAL EXAM:  VITAL SIGNS: ED Triage Vitals  Encounter Vitals Group     BP 04/19/24 1954 122/87     Pulse Rate 04/19/24 1954 (!) 105     Resp 04/19/24 1954 (!) 22     Temp 04/19/24 1954 98.6 F (37 C)     Temp Source 04/20/24 0024 Oral     SpO2 04/19/24 1954 99 %      Weight 04/19/24 1952 130 lb (59 kg)     Height 04/19/24 1952 5' 2 (1.575 m)   Constitutional: Alert and oriented. Well appearing and in no acute distress. Eyes: Conjunctivae are normal.  Head: Atraumatic. Nose: No congestion/rhinnorhea. Mouth/Throat: Mucous membranes are moist. Neck: No stridor.   Cardiovascular: Normal rate, regular rhythm. Good peripheral circulation. Grossly normal heart sounds.   Respiratory: Normal respiratory effort.  No retractions. Lungs CTAB. Gastrointestinal: Soft and nontender. Mild distention.  Musculoskeletal: No gross deformities of extremities. Neurologic:  Normal speech and language.  Skin:  Skin is warm, dry and intact. No rash noted.  ____________________________________________   LABS (all labs ordered are listed, but only abnormal results are displayed)  Labs Reviewed  COMPREHENSIVE METABOLIC PANEL WITH GFR - Abnormal; Notable for the following components:      Result Value   CO2 20 (*)    Glucose, Bld 129 (*)    Creatinine, Ser 1.34 (*)    GFR, Estimated 45 (*)    All other components within normal limits  URINALYSIS, ROUTINE W REFLEX MICROSCOPIC - Abnormal; Notable for the following components:   APPearance HAZY (*)    Protein, ur 100 (*)    Leukocytes,Ua SMALL (*)    Bacteria, UA RARE (*)    All other components within normal limits  LIPASE, BLOOD  CBC   ____________________________________________  RADIOLOGY  CT ABDOMEN PELVIS W CONTRAST Result Date: 04/19/2024 CLINICAL DATA:  Acute abdominal pain. EXAM: CT ABDOMEN AND PELVIS WITH CONTRAST TECHNIQUE: Multidetector CT imaging of the abdomen and pelvis was performed using the standard protocol following bolus administration of intravenous contrast. RADIATION DOSE REDUCTION: This exam was performed according to the departmental dose-optimization program which includes automated exposure control, adjustment of the mA and/or kV according to patient size and/or use of iterative  reconstruction technique. CONTRAST:  75mL OMNIPAQUE  IOHEXOL  350 MG/ML SOLN COMPARISON:  02/17/2024 FINDINGS: Lower chest: The lung bases are clear of an acute process. No worrisome pulmonary lesions or pulmonary nodules. No pericardial effusion. Hepatobiliary: No hepatic lesions or intrahepatic biliary dilatation. Gallbladder is mildly distended but no gallbladder wall thickening, pericholecystic fluid or obvious gallstones. Normal caliber common bile duct. Pancreas: No mass, inflammation or ductal dilatation. Spleen: Normal in size without focal abnormality. Adrenals/Urinary Tract: Adrenal glands are normal. Patient has a right pelvic kidney which demonstrates areas of renal cortical thinning and scarring. The kidney is small. The left kidney also demonstrates significant areas of renal cortical scarring but no worrisome renal lesions or hydronephrosis. The bladder is largely decompressed. No obvious abnormality. Stomach/Bowel: The stomach and duodenum are unremarkable. There are mildly dilated small bowel loops in the right upper quadrant. There is also mucosal fold thickening and wall thickening. Transition to nondilated/decompressed mid distal ileum near anastomotic bowel sutures. Findings suspicious for a early or partial small obstruction. There is moderate stool throughout the colon and down into the rectum. No colonic abnormality. Vascular/Lymphatic: The aorta and branch vessels are patent. Stable scattered atherosclerotic calcifications. Stable 19 mm rim calcified splenic artery aneurysm. Reproductive: Surgically absent. Other: No free air or free fluid. Musculoskeletal: Thoracolumbar scoliosis with Harrington rods and extensive artifact. There is also a left total hip arthroplasty. No bone lesions or acute bony findings. IMPRESSION: 1. CT findings consistent with an early or partial small bowel obstruction. This is likely due to adhesions in the right lower abdomen. 2. No other significant abdominal/pelvic  findings, mass lesions or adenopathy. 3. Stable 19 mm rim calcified splenic artery aneurysm. 4. Right pelvic kidney with areas of renal cortical thinning and scarring. 5. Aortic atherosclerosis. Aortic Atherosclerosis (ICD10-I70.0). Electronically Signed   By: MYRTIS Stammer M.D.   On: 04/19/2024 22:40    ____________________________________________   PROCEDURES  Procedure(s) performed:   Procedures  None  ____________________________________________   INITIAL IMPRESSION / ASSESSMENT AND PLAN / ED COURSE  Pertinent labs & imaging results that were available during my care of the patient were reviewed by me and considered in my medical decision making (see chart for details).   This patient is Presenting for Evaluation of abdominal pain, which does require a range of treatment options, and is a complaint that involves a high risk of morbidity and mortality.  The Differential Diagnoses includes but is not exclusive to acute cholecystitis, intrathoracic causes for epigastric abdominal pain, gastritis, duodenitis, pancreatitis, small bowel or large bowel obstruction, abdominal aortic aneurysm, hernia, gastritis, etc.   Critical Interventions-    Medications  lactated ringers  infusion (has no administration in time range)  HYDROmorphone  (DILAUDID ) injection 0.5 mg (has no administration in time range)  ondansetron  (ZOFRAN ) injection 4 mg (has no administration in time range)  ondansetron  (ZOFRAN -ODT) disintegrating tablet 4 mg (4 mg Oral Given 04/19/24 2003)  oxyCODONE -acetaminophen  (PERCOCET/ROXICET) 5-325 MG per tablet 1 tablet (1 tablet Oral Given 04/19/24 2003)  iohexol  (OMNIPAQUE ) 350 MG/ML injection 75 mL (75 mLs Intravenous  Contrast Given 04/19/24 2143)    Reassessment after intervention: pain improved.   Clinical Laboratory Tests Ordered, included CMP with normal LFTs and bilirubin.  Lipase normal.  CBC without leukocytosis.  UA without infection.  Radiologic Tests Ordered,  included CT abdomen/pelvis. I independently interpreted the images and agree with radiology interpretation.   Cardiac Monitor Tracing which shows NSR.    Social Determinants of Health Risk patient is a non-smoker.   Consult complete with [facility], Dr. Sundil. Plan for admit.   Medical Decision Making: Summary:  The patient presents to the emergency department for evaluation of abdominal pain and vomiting.  CT scan and lab work from triage show early bowel obstruction versus partial bowel obstruction.  She does not recall passing either stool or flatus since she began vomiting.  Plan for NG tube placement with continued nausea and return of pain.   Reevaluation with update and discussion with patient. Plan for admit. She is in agreement.   Patient's presentation is most consistent with acute presentation with potential threat to life or bodily function.   Disposition: admit  ____________________________________________  FINAL CLINICAL IMPRESSION(S) / ED DIAGNOSES  Final diagnoses:  SBO (small bowel obstruction) (HCC)    Note:  This document was prepared using Dragon voice recognition software and may include unintentional dictation errors.  Fonda Law, MD, [facility] Emergency Medicine    Ericah Scotto, Fonda MATSU, MD 04/20/24 [phone]

## 2024-04-20 NOTE — Progress Notes (Signed)
 Pt complaint of chest pain (after coughing), VS taken within normal limits, EKG was done NSR. MD was notified and ordered Troponin.  04/20/24 1823  Vitals  BP 115/80  MAP (mmHg) 91  BP Location Left Arm  BP Method Automatic  Patient Position (if appropriate) Lying  Pulse Rate 69  Pulse Rate Source Dinamap  MEWS COLOR  MEWS Score Color Green  Oxygen Therapy  SpO2 100 %  O2 Device Room Air  Pain Assessment  Pain Scale 0-10  Pain Score 2  MEWS Score  MEWS Temp 0  MEWS Systolic 0  MEWS Pulse 0  MEWS RR 0  MEWS LOC 0  MEWS Score 0

## 2024-04-20 NOTE — Progress Notes (Signed)
 PROGRESS NOTE    Nicole Morris  FMW:[id number] DOB: [birth year] DOA: 04/19/2024 PCP: Teresa Channel, MD    Brief Narrative:   63 year old with history of abdominal surgery, SBO, asthma, chronic osteoarthritis, GAD, CKD 3B, hypothyroidism, L2 iliac wing instrumentation and fusion with displaced left iliac screw head requiring repair December thousand 24 presented to the ED with abdominal pain.  Patient found to have partial SBO likely secondary to radiation.  SBO protocol initiated, general surgery was consulted.  Assessment & Plan:  Principal Problem:   SBO (small bowel obstruction) (HCC) Active Problems:   CKD (chronic kidney disease) stage 3, GFR 30-59 ml/min (HCC)   Hypothyroidism   History of back surgery   History of asthma   GAD (generalized anxiety disorder)   Partial small bowel obstruction History of SBO abdominal surgery Small bowel protocol initiated, NG tube placed.  General surgery team has been consulted.  Currently n.p.o., getting IV fluids   CKD stage IIIb -Creatinine 1.34 which is around baseline   Hypothyroidism -As patient is n.p.o. holding oral levothyroxine .   History of iliac crest surgery   History of asthma -Stable.  Continue to check pulse ox and albuterol  as needed   Generalized anxiety disorder -As patient is n.p.o. holding BuSpar, rizatriptan  and Zoloft .     Hyperlipidemia - As patient is n.p.o. holding Crestor .     DVT prophylaxis: SCDs    Code Status: Full Code Family Communication:   Status is: Inpatient Remains inpatient appropriate because: Ongoing management for SBO   PT Follow up Recs:   Subjective:  Still having some abd pain.   Examination:  General exam: Appears calm and comfortable  Respiratory system: Clear to auscultation. Respiratory effort normal. Cardiovascular system: S1 & S2 heard, RRR. No JVD, murmurs, rubs, gallops or clicks. No pedal edema. Gastrointestinal system: Abdomen is nondistended, diminished  BS Central nervous system: Alert and oriented. No focal neurological deficits. Extremities: Symmetric 5 x 5 power. Skin: No rashes, lesions or ulcers Psychiatry: Judgement and insight appear normal. Mood & affect appropriate.                Diet Orders (From admission, onward)     Start     Ordered   04/20/24 0454  Diet NPO time specified  Diet effective now        04/20/24 0453            Objective: Vitals:   04/20/24 0530 04/20/24 0600 04/20/24 0649 04/20/24 0852  BP: 129/83  123/82 112/80  Pulse: 70 65 66 66  Resp:  15 17 16   Temp:   97.6 F (36.4 C) 98.6 F (37 C)  TempSrc:   Oral Oral  SpO2: 99% 99% 99% 96%  Weight:      Height:       No intake or output data in the 24 hours ending 04/20/24 1259 Filed Weights   04/19/24 1952  Weight: 59 kg    Scheduled Meds:  diatrizoate  meglumine -sodium  90 mL Per NG tube Once   sodium chloride  flush  3 mL Intravenous Q12H   Continuous Infusions:  sodium chloride      dextrose  5% lactated ringers  100 mL/hr at 04/20/24 1033    Nutritional status     Body mass index is 23.78 kg/m.  Data Reviewed:   CBC: Recent Labs  Lab 04/19/24 2008 04/20/24 0550  WBC 7.3 5.5  HGB 13.5 12.9  HCT 40.3 39.2  MCV 92.6 93.8  PLT 221 198   Basic  Metabolic Panel: Recent Labs  Lab 04/19/24 2008 04/20/24 0550  NA 142 140  K 4.1 3.5  CL 110 110  CO2 20* 21*  GLUCOSE 129* 109*  BUN 22 20  CREATININE 1.34* 1.35*  CALCIUM  9.5 9.3   GFR: Estimated Creatinine Clearance: 33.7 mL/min (A) (by C-G formula based on SCr of 1.35 mg/dL (H)). Liver Function Tests: Recent Labs  Lab 04/19/24 2008  AST 19  ALT 13  ALKPHOS 81  BILITOT 0.7  PROT 7.3  ALBUMIN 4.0   Recent Labs  Lab 04/19/24 2008  LIPASE 36   No results for input(s): AMMONIA in the last 168 hours. Coagulation Profile: No results for input(s): INR, PROTIME in the last 168 hours. Cardiac Enzymes: No results for input(s): CKTOTAL, CKMB,  CKMBINDEX, TROPONINI in the last 168 hours. BNP (last 3 results) No results for input(s): PROBNP in the last 8760 hours. HbA1C: No results for input(s): HGBA1C in the last 72 hours. CBG: No results for input(s): GLUCAP in the last 168 hours. Lipid Profile: No results for input(s): CHOL, HDL, LDLCALC, TRIG, CHOLHDL, LDLDIRECT in the last 72 hours. Thyroid  Function Tests: No results for input(s): TSH, T4TOTAL, FREET4, T3FREE, THYROIDAB in the last 72 hours. Anemia Panel: No results for input(s): VITAMINB12, FOLATE, FERRITIN, TIBC, IRON, RETICCTPCT in the last 72 hours. Sepsis Labs: No results for input(s): PROCALCITON, LATICACIDVEN in the last 168 hours.  No results found for this or any previous visit (from the past 240 hours).       Radiology Studies: DG Abd Portable 1V-Small Bowel Protocol-Position Verification Result Date: 04/20/2024 CLINICAL DATA:  63 year old female NG tube placement. EXAM: PORTABLE ABDOMEN - 1 VIEW COMPARISON:  CT Abdomen and Pelvis yesterday. FINDINGS: Portable AP semi upright view at 0621 hours. Enteric tube courses through the central mediastinum, terminates in the left abdomen with side hole at the level of the gastric body. Stable visible lung ventilation. Stable bowel gas pattern compared to the CT scout view yesterday. Scoliosis, extensive posterior and lower lumbar interbody spinal fusion hardware. Left chest loop recorder or superficial ICD redemonstrated. IMPRESSION: Satisfactory enteric tube placement into the stomach. Electronically Signed   By: VEAR Hurst M.D.   On: 04/20/2024 07:08   CT ABDOMEN PELVIS W CONTRAST Result Date: 04/19/2024 CLINICAL DATA:  Acute abdominal pain. EXAM: CT ABDOMEN AND PELVIS WITH CONTRAST TECHNIQUE: Multidetector CT imaging of the abdomen and pelvis was performed using the standard protocol following bolus administration of intravenous contrast. RADIATION DOSE REDUCTION: This exam was  performed according to the departmental dose-optimization program which includes automated exposure control, adjustment of the mA and/or kV according to patient size and/or use of iterative reconstruction technique. CONTRAST:  75mL OMNIPAQUE  IOHEXOL  350 MG/ML SOLN COMPARISON:  02/17/2024 FINDINGS: Lower chest: The lung bases are clear of an acute process. No worrisome pulmonary lesions or pulmonary nodules. No pericardial effusion. Hepatobiliary: No hepatic lesions or intrahepatic biliary dilatation. Gallbladder is mildly distended but no gallbladder wall thickening, pericholecystic fluid or obvious gallstones. Normal caliber common bile duct. Pancreas: No mass, inflammation or ductal dilatation. Spleen: Normal in size without focal abnormality. Adrenals/Urinary Tract: Adrenal glands are normal. Patient has a right pelvic kidney which demonstrates areas of renal cortical thinning and scarring. The kidney is small. The left kidney also demonstrates significant areas of renal cortical scarring but no worrisome renal lesions or hydronephrosis. The bladder is largely decompressed. No obvious abnormality. Stomach/Bowel: The stomach and duodenum are unremarkable. There are mildly dilated small bowel loops in the  right upper quadrant. There is also mucosal fold thickening and wall thickening. Transition to nondilated/decompressed mid distal ileum near anastomotic bowel sutures. Findings suspicious for a early or partial small obstruction. There is moderate stool throughout the colon and down into the rectum. No colonic abnormality. Vascular/Lymphatic: The aorta and branch vessels are patent. Stable scattered atherosclerotic calcifications. Stable 19 mm rim calcified splenic artery aneurysm. Reproductive: Surgically absent. Other: No free air or free fluid. Musculoskeletal: Thoracolumbar scoliosis with Harrington rods and extensive artifact. There is also a left total hip arthroplasty. No bone lesions or acute bony  findings. IMPRESSION: 1. CT findings consistent with an early or partial small bowel obstruction. This is likely due to adhesions in the right lower abdomen. 2. No other significant abdominal/pelvic findings, mass lesions or adenopathy. 3. Stable 19 mm rim calcified splenic artery aneurysm. 4. Right pelvic kidney with areas of renal cortical thinning and scarring. 5. Aortic atherosclerosis. Aortic Atherosclerosis (ICD10-I70.0). Electronically Signed   By: MYRTIS Stammer M.D.   On: 04/19/2024 22:40           LOS: 0 days   Time spent= 35 mins    [place]ess JAYSON Dare, MD [facility]  If 7PM-7AM, please contact night-coverage  04/20/2024, 12:59 PM

## 2024-04-20 NOTE — H&P (Signed)
 History and Physical    Nicole Morris FMW:[id number] DOB: 1960-11-26 DOA: 04/19/2024  PCP: Teresa Channel, MD   Patient coming from: Home   Chief Complaint:  Chief Complaint  Patient presents with   Abdominal Pain   ED TRIAGE note: Abdominal pain, headache nausea and vomiting for 24 hours  HPI:  Nicole Morris is a 63 y.o. female with medical history significant of abdominal surgery, SBO, asthma, chronic osteoarthritis, generalized anxiety disorder, CKD 3B, hypothyroidism and history of L2 iliac wing instrumentation and fusion with displaced left iliac screw head status post repair in 08/2023 presented emergency department complaining of abdominal pain with associated nausea and vomiting for last 24 hours.  Patient reported increased abdominal fullness with associated nausea and vomiting.  Patient is complaining about constant right lower quadrant abdominal pain which is cramping in quality.  In last 24 hours patient was unable to pass stool and flatus.  Per patient she probably has small bowel obstruction in 2009 or 2010.  Patient reported she has almost 25 times of abdominal surgery in the past. At this time patient does not have any other complaint.  ED Course:  At presentation to ED patient found hemodynamically stable. CBC unremarkable. CMP showing creatinine 1.34 and GFR 45.  Low bicarb 20.  Function at baseline.  Normal lipase level.  Normal hepatic function panel.  UA showing evidence of UTI.  CT abdomen pelvis showing early and partial small bowel obstruction likely due to adhesion of the right lower abdomen.No other significant abdominal/pelvic findings, mass lesions or adenopathy. 3. Stable 19 mm rim calcified splenic artery aneurysm. 4. Right pelvic kidney with areas of renal cortical thinning and scarring. 5. Aortic atherosclerosis.  In the ED patient has been given multiple doses of narcotics and currently on maintenance therapy.  NG tube has been placed by ED  physician.  I have ordered SBO protocol.  Hospitalist has been consulted for further evaluation management of small bowel obstruction.  Significant labs in the ED: Lab Orders         Lipase, blood         Comprehensive metabolic panel         CBC         Urinalysis, Routine w reflex microscopic -Urine, Clean Catch         CBC         Basic metabolic panel       Review of Systems:  Review of Systems  Constitutional:  Negative for chills, fever, malaise/fatigue and weight loss.  Respiratory:  Negative for cough.   Cardiovascular:  Negative for chest pain.  Gastrointestinal:  Positive for abdominal pain, nausea and vomiting. Negative for blood in stool, constipation, diarrhea and heartburn.  Neurological:  Negative for dizziness.  Psychiatric/Behavioral:  The patient is not nervous/anxious.     Past Medical History:  Diagnosis Date   Anxiety    Asthma    Chronic lower back pain    Dysrhythmia    pt got Tachy last time under anesthesia   High cholesterol    History of blood transfusion    related to surgeries (06/14/2018)   Hypothyroidism    Kidney disease, chronic, stage II (GFR 60-89 ml/min)    one of my kidneys is in my abdomen (06/14/2018)   Migraine    daily-weekly (06/14/2018)   Neuromuscular disorder (HCC)    neuropathy in legs from back surgery   PONV (postoperative nausea and vomiting)    Scoliosis     Past  Surgical History:  Procedure Laterality Date   ABDOMINAL HYSTERECTOMY  1988   BACK SURGERY     BLADDER AUGMENTATION  1988   HERNIA REPAIR     JOINT REPLACEMENT     KNEE ARTHROSCOPY Right    SPINAL FUSION  2008   w/harrington rods, etc   TOTAL HIP ARTHROPLASTY Left 06/14/2018   TOTAL HIP ARTHROPLASTY Left 06/14/2018   Procedure: LEFT TOTAL HIP ARTHROPLASTY ANTERIOR APPROACH;  Surgeon: Vernetta Lonni GRADE, MD;  Location: [facility] OR;  Service: Orthopedics;  Laterality: Left;     reports that she has never smoked. She has never used smokeless  tobacco. She reports current alcohol use. She reports that she does not use drugs.  Allergies  Allergen Reactions   Atorvastatin     Other reaction(s): joint pain   Meperidine     Other Other (See Comments)    Darvocet N doesn't like the way it makes her feel   Quinolones Other (See Comments)    Ascending thoracic aortic aneurysm, use with caution   Codeine Nausea Only   Metoclopramide      Akathisia   Mushroom Extract Complex (Obsolete) Nausea Only    Nausea     Family History  Problem Relation Age of Onset   Lung cancer Mother        Hx of ILD   Liver cancer Father     Prior to Admission medications   Medication Sig Start Date End Date Taking? Authorizing Provider  albuterol  (PROVENTIL  HFA;VENTOLIN  HFA) 108 (90 BASE) MCG/ACT inhaler Inhale 2 puffs into the lungs every 6 (six) hours as needed for wheezing or shortness of breath.    [provider]  buPROPion  (WELLBUTRIN  XL) 300 MG 24 hr tablet Take 300 mg by mouth daily.  11/30/17   [provider]  butalbital -acetaminophen -caffeine  (FIORICET , ESGIC ) 50-325-40 MG tablet Take 1 tablet by mouth 3 (three) times daily as needed for headache or migraine. 03/25/18   [provider]  Cyanocobalamin  (B-12) 1000 MCG SUBL Place 1,000 mcg under the tongue daily. 03/30/22   Thayil, Irene T, PA-C  diclofenac  sodium (VOLTAREN ) 1 % GEL Apply 2 g topically daily as needed (joint pain).  01/21/18   [provider]  levothyroxine  (SYNTHROID ) 50 MCG tablet Take 50 mcg by mouth every morning.    [provider]  LORazepam  (ATIVAN ) 0.5 MG tablet Take 0.25-0.5 mg by mouth every 8 (eight) hours as needed for anxiety.    [provider]  ondansetron  (ZOFRAN ) 4 MG tablet Take 1 tablet (4 mg total) by mouth as needed for nausea or vomiting. 10/13/22   Gayland Lauraine PARAS, NP  ondansetron  (ZOFRAN -ODT) 4 MG disintegrating tablet Take 1 tablet (4 mg total) by mouth every 8 (eight) hours as needed for nausea or  vomiting. 02/17/24   Curatolo, Adam, DO  oxyCODONE  (OXY IR/ROXICODONE ) 5 MG immediate release tablet Take 5 mg by mouth 3 (three) times daily as needed. 04/16/22   [provider]  pantoprazole  (PROTONIX ) 20 MG tablet Take 1 tablet (20 mg total) by mouth daily for 14 days. 02/17/24 04/03/24  Curatolo, Adam, DO  Rimegepant Sulfate (NURTEC) 75 MG TBDP Take 1 tablet (75 mg total) by mouth every other day. 10/13/22   Gayland Lauraine PARAS, NP  rizatriptan  (MAXALT -MLT) 10 MG disintegrating tablet Take 1 tablet (10 mg total) by mouth as needed for migraine. May repeat in 2 hours if needed 10/13/22   Gayland Lauraine PARAS, NP  rosuvastatin  (CRESTOR ) 5 MG tablet Take 5 mg  by mouth daily.  02/18/18   [provider]  sertraline  (ZOLOFT ) 100 MG tablet Take 100 mg by mouth every evening.     [provider]  tiZANidine  (ZANAFLEX ) 2 MG tablet Take 1 tablet (2 mg total) by mouth every 8 (eight) hours as needed for muscle spasms. 04/02/22   Gayland Lauraine PARAS, NP     Physical Exam: Vitals:   04/19/24 1952 04/19/24 1954 04/20/24 0024 04/20/24 0425  BP:  122/87 121/85 137/89  Pulse:  (!) 105 76 78  Resp:  (!) 22 17 18   Temp:  98.6 F (37 C) 98.1 F (36.7 C) 97.8 F (36.6 C)  TempSrc:   Oral Oral  SpO2:  99% 99% 100%  Weight: 59 kg     Height: 5' 2 (1.575 m)       Physical Exam Vitals and nursing note reviewed.  Constitutional:      General: She is not in acute distress.    Appearance: She is not ill-appearing.  Cardiovascular:     Rate and Rhythm: Regular rhythm.     Heart sounds: Normal heart sounds.  Abdominal:     General: Bowel sounds are decreased.     Palpations: Abdomen is soft.     Tenderness: There is abdominal tenderness in the right lower quadrant. There is no guarding or rebound.  Skin:    General: Skin is dry.     Capillary Refill: Capillary refill takes less than 2 seconds.  Neurological:     Mental Status: She is oriented to person, place, and time.  Psychiatric:         Mood and Affect: Mood normal. Mood is not anxious.        Behavior: Behavior normal.      Labs on Admission: I have personally reviewed following labs and imaging studies  CBC: Recent Labs  Lab 04/19/24 2008  WBC 7.3  HGB 13.5  HCT 40.3  MCV 92.6  PLT 221   Basic Metabolic Panel: Recent Labs  Lab 04/19/24 2008  NA 142  K 4.1  CL 110  CO2 20*  GLUCOSE 129*  BUN 22  CREATININE 1.34*  CALCIUM  9.5   GFR: Estimated Creatinine Clearance: 34 mL/min (A) (by C-G formula based on SCr of 1.34 mg/dL (H)). Liver Function Tests: Recent Labs  Lab 04/19/24 2008  AST 19  ALT 13  ALKPHOS 81  BILITOT 0.7  PROT 7.3  ALBUMIN 4.0   Recent Labs  Lab 04/19/24 2008  LIPASE 36   No results for input(s): AMMONIA in the last 168 hours. Coagulation Profile: No results for input(s): INR, PROTIME in the last 168 hours. Cardiac Enzymes: No results for input(s): CKTOTAL, CKMB, CKMBINDEX, TROPONINI, TROPONINIHS in the last 168 hours. BNP (last 3 results) No results for input(s): BNP in the last 8760 hours. HbA1C: No results for input(s): HGBA1C in the last 72 hours. CBG: No results for input(s): GLUCAP in the last 168 hours. Lipid Profile: No results for input(s): CHOL, HDL, LDLCALC, TRIG, CHOLHDL, LDLDIRECT in the last 72 hours. Thyroid  Function Tests: No results for input(s): TSH, T4TOTAL, FREET4, T3FREE, THYROIDAB in the last 72 hours. Anemia Panel: No results for input(s): VITAMINB12, FOLATE, FERRITIN, TIBC, IRON, RETICCTPCT in the last 72 hours. Urine analysis:    Component Value Date/Time   COLORURINE YELLOW 04/19/2024 1959   APPEARANCEUR HAZY (A) 04/19/2024 1959   LABSPEC 1.011 04/19/2024 1959   PHURINE 6.0 04/19/2024 1959   GLUCOSEU NEGATIVE 04/19/2024 1959   HGBUR  NEGATIVE 04/19/2024 1959   BILIRUBINUR NEGATIVE 04/19/2024 1959   KETONESUR NEGATIVE 04/19/2024 1959   PROTEINUR 100 (A) 04/19/2024 1959   NITRITE  NEGATIVE 04/19/2024 1959   LEUKOCYTESUR SMALL (A) 04/19/2024 1959    Radiological Exams on Admission: I have personally reviewed images CT ABDOMEN PELVIS W CONTRAST Result Date: 04/19/2024 CLINICAL DATA:  Acute abdominal pain. EXAM: CT ABDOMEN AND PELVIS WITH CONTRAST TECHNIQUE: Multidetector CT imaging of the abdomen and pelvis was performed using the standard protocol following bolus administration of intravenous contrast. RADIATION DOSE REDUCTION: This exam was performed according to the departmental dose-optimization program which includes automated exposure control, adjustment of the mA and/or kV according to patient size and/or use of iterative reconstruction technique. CONTRAST:  75mL OMNIPAQUE  IOHEXOL  350 MG/ML SOLN COMPARISON:  02/17/2024 FINDINGS: Lower chest: The lung bases are clear of an acute process. No worrisome pulmonary lesions or pulmonary nodules. No pericardial effusion. Hepatobiliary: No hepatic lesions or intrahepatic biliary dilatation. Gallbladder is mildly distended but no gallbladder wall thickening, pericholecystic fluid or obvious gallstones. Normal caliber common bile duct. Pancreas: No mass, inflammation or ductal dilatation. Spleen: Normal in size without focal abnormality. Adrenals/Urinary Tract: Adrenal glands are normal. Patient has a right pelvic kidney which demonstrates areas of renal cortical thinning and scarring. The kidney is small. The left kidney also demonstrates significant areas of renal cortical scarring but no worrisome renal lesions or hydronephrosis. The bladder is largely decompressed. No obvious abnormality. Stomach/Bowel: The stomach and duodenum are unremarkable. There are mildly dilated small bowel loops in the right upper quadrant. There is also mucosal fold thickening and wall thickening. Transition to nondilated/decompressed mid distal ileum near anastomotic bowel sutures. Findings suspicious for a early or partial small obstruction. There is moderate  stool throughout the colon and down into the rectum. No colonic abnormality. Vascular/Lymphatic: The aorta and branch vessels are patent. Stable scattered atherosclerotic calcifications. Stable 19 mm rim calcified splenic artery aneurysm. Reproductive: Surgically absent. Other: No free air or free fluid. Musculoskeletal: Thoracolumbar scoliosis with Harrington rods and extensive artifact. There is also a left total hip arthroplasty. No bone lesions or acute bony findings. IMPRESSION: 1. CT findings consistent with an early or partial small bowel obstruction. This is likely due to adhesions in the right lower abdomen. 2. No other significant abdominal/pelvic findings, mass lesions or adenopathy. 3. Stable 19 mm rim calcified splenic artery aneurysm. 4. Right pelvic kidney with areas of renal cortical thinning and scarring. 5. Aortic atherosclerosis. Aortic Atherosclerosis (ICD10-I70.0). Electronically Signed   By: MYRTIS Stammer M.D.   On: 04/19/2024 22:40    Assessment/Plan: Principal Problem:   SBO (small bowel obstruction) (HCC) Active Problems:   CKD (chronic kidney disease) stage 3, GFR 30-59 ml/min (HCC)   Hypothyroidism   History of back surgery   History of asthma   GAD (generalized anxiety disorder)    Assessment and Plan: Small bowel obstruction History of SBO abdominal surgery -Presented to emergency department complaining of abdominal pain, nausea and vomiting over the course of last 24 hours.  Patient reported no bowel movement and unable to pass flatus during the same.  Patient stating she has history of SBO in 2009 or 2010. -Physical exam soft and nonacute abdomen.  No evidence of guarding and rebound tenderness. - CT abdomen showed early or partial small bowel obstruction due to right lower quadrant adhesion. - ED physician planning to place the NG tube and starting SBO protocol. -Continue NG tube with low intermittent suction. -Consulted general surgery Dr.Burton  via secure chat  to evaluate in the a.m. - Keeping patient n.p.o. - Keep patient hydrated with IV fluid LR 125 cc/h. - Continue Dilaudid  and Zofran  as needed.  CKD stage IIIb -Creatinine 1.34 and GFR 45.  Renal function at baseline.  Continue to monitor  Hypothyroidism -As patient is n.p.o. holding oral levothyroxine .  History of iliac crest surgery  History of asthma -Stable.  Continue to check pulse ox and albuterol  as needed  Generalized anxiety disorder -As patient is n.p.o. holding BuSpar, rizatriptan  and Zoloft .   Hyperlipidemia - As patient is n.p.o. holding Crestor .  DVT prophylaxis:  SCDs.  Deferring pharmacological DVT prophylaxis in case patient will undergo any abdominal surgical intervention. Code Status:  Full Code Diet: N.p.o. Family Communication:   Family was present at bedside, at the time of interview. Opportunity was given to ask question and all questions were answered satisfactorily.  Disposition Plan: Continue NG tube with low intermittent suction and SBO protocol. Consults: General Surgery Admission status:   Inpatient, Telemetry bed  Severity of Illness: The appropriate patient status for this patient is INPATIENT. Inpatient status is judged to be reasonable and necessary in order to provide the required intensity of service to ensure the patient's safety. The patient's presenting symptoms, physical exam findings, and initial radiographic and laboratory data in the context of their chronic comorbidities is felt to place them at high risk for further clinical deterioration. Furthermore, it is not anticipated that the patient will be medically stable for discharge from the hospital within 2 midnights of admission.   * I certify that at the point of admission it is my clinical judgment that the patient will require inpatient hospital care spanning beyond 2 midnights from the point of admission due to high intensity of service, high risk for further deterioration and high  frequency of surveillance required.DEWAINE    Jabez Molner, MD [facility]  How to contact the [facility] Attending or Consulting provider 7A - 7P or covering provider during after hours 7P -7A, for this patient.  Check the care team in [facility] and look for a) attending/consulting [facility] provider listed and b) the [facility] team listed Log into [email or web address] and use [place]'s universal password to access. If you do not have the password, please contact the hospital operator. Locate the [facility] provider you are looking for under [facility] and page to a number that you can be directly reached. If you still have difficulty reaching the provider, please page the [facility] (Director on Call) for the Hospitalists listed on amion for assistance.  04/20/2024, 5:09 AM

## 2024-04-20 NOTE — Progress Notes (Signed)
 Patient arrived in the unit. Continued IV fluid as previously ordered. Placed patient on SCD. Placed patient on telemetry machine.

## 2024-04-20 NOTE — Consult Note (Signed)
 Nicole Morris April 02, 1961  [id number].    Requesting MD: Dr. Burgess Dare Chief Complaint/Reason for Consult: SBO  HPI: Nicole Morris is a 63 y.o. female with a history of  Hypothyroidism, HLD, CKD3B who presented to the ED for abdominal pain.  Patient reports that 2 days ago she began having right-sided and epigastric abdominal pain with associated bloating, nausea and vomiting.  Her symptoms worsened prompting her visit to the ED.  Last episode of flatus was yesterday.  Last BM 2 days ago prior to symptom onset.  She reports at baseline she normally has a BM every 2-3 days and occasionally needs to take Colace to help with constipation. No fevers. She denies any urinary symptoms but does note that she self caths at home. She reports she has a history of RIH repair when she was 63 years old, a bladder augmentation surgery at [facility] in 1988 that involved a SBR, abdominal hysterectomy, and anterior approach spine surgery in 2008.  She notes after her surgery in 2008 she was in the hospital for 2 weeks postop with a SBO versus ileus.  No other history of SBO's. Her workup was concerning for an SBO and general surgery was asked to see.  Her abdominal pain has improved since admission but she still is nauseated with her NG tube currently clamped.  Patient is not on any blood thinners.  She denies tobacco or drug use.  She does report occasional alcohol use. She has never had a colonoscopy.   ROS: ROS As above, see HPI  Family History  Problem Relation Age of Onset   Lung cancer Mother        Hx of ILD   Liver cancer Father     Past Medical History:  Diagnosis Date   Anxiety    Asthma    Chronic lower back pain    Dysrhythmia    pt got Tachy last time under anesthesia   High cholesterol    History of blood transfusion    related to surgeries (06/14/2018)   Hypothyroidism    Kidney disease, chronic, stage II (GFR 60-89 ml/min)    one of my kidneys is in my abdomen (06/14/2018)    Migraine    daily-weekly (06/14/2018)   Neuromuscular disorder (HCC)    neuropathy in legs from back surgery   PONV (postoperative nausea and vomiting)    Scoliosis     Past Surgical History:  Procedure Laterality Date   ABDOMINAL HYSTERECTOMY  1988   BACK SURGERY     BLADDER AUGMENTATION  1988   HERNIA REPAIR     JOINT REPLACEMENT     KNEE ARTHROSCOPY Right    SPINAL FUSION  2008   w/harrington rods, etc   TOTAL HIP ARTHROPLASTY Left 06/14/2018   TOTAL HIP ARTHROPLASTY Left 06/14/2018   Procedure: LEFT TOTAL HIP ARTHROPLASTY ANTERIOR APPROACH;  Surgeon: Vernetta Lonni GRADE, MD;  Location: [facility] OR;  Service: Orthopedics;  Laterality: Left;    Social History:  reports that she has never smoked. She has never used smokeless tobacco. She reports current alcohol use. She reports that she does not use drugs.  Allergies:  Allergies  Allergen Reactions   Atorvastatin     Other reaction(s): joint pain   Meperidine     Other Other (See Comments)    Darvocet N doesn't like the way it makes her feel   Quinolones Other (See Comments)    Ascending thoracic aortic aneurysm, use with caution   Codeine Nausea  Only   Metoclopramide      tremors   Mushroom Extract Complex (Obsolete) Nausea And Vomiting    Projectile vomiting    Medications Prior to Admission  Medication Sig Dispense Refill   buPROPion  (WELLBUTRIN  XL) 300 MG 24 hr tablet Take 300 mg by mouth daily.      butalbital -acetaminophen -caffeine  (FIORICET , ESGIC ) 50-325-40 MG tablet Take 1 tablet by mouth 3 (three) times daily as needed for headache or migraine.     Cyanocobalamin  (B-12) 1000 MCG SUBL Place 1,000 mcg under the tongue daily. 30 tablet 3   diclofenac  sodium (VOLTAREN ) 1 % GEL Apply 2 g topically daily as needed (joint pain).      levothyroxine  (SYNTHROID ) 50 MCG tablet Take 50 mcg by mouth every morning.     LORazepam  (ATIVAN ) 0.5 MG tablet Take 0.25-0.5 mg by mouth every 8 (eight) hours as needed for anxiety.      ondansetron  (ZOFRAN -ODT) 4 MG disintegrating tablet Take 1 tablet (4 mg total) by mouth every 8 (eight) hours as needed for nausea or vomiting. 20 tablet 0   oxyCODONE  (OXY IR/ROXICODONE ) 5 MG immediate release tablet Take 5 mg by mouth daily as needed for moderate pain (pain score 4-6).     Rimegepant Sulfate (NURTEC) 75 MG TBDP Take 1 tablet (75 mg total) by mouth every other day. 16 tablet 11   rosuvastatin  (CRESTOR ) 5 MG tablet Take 5 mg by mouth daily.      sertraline  (ZOLOFT ) 100 MG tablet Take 100 mg by mouth every evening.      tiZANidine  (ZANAFLEX ) 2 MG tablet Take 1 tablet (2 mg total) by mouth every 8 (eight) hours as needed for muscle spasms. 30 tablet 3   albuterol  (PROVENTIL  HFA;VENTOLIN  HFA) 108 (90 BASE) MCG/ACT inhaler Inhale 2 puffs into the lungs every 6 (six) hours as needed for wheezing or shortness of breath. (Patient not taking: Reported on 04/20/2024)     pantoprazole  (PROTONIX ) 20 MG tablet Take 1 tablet (20 mg total) by mouth daily for 14 days. (Patient not taking: Reported on 04/20/2024) 14 tablet 0     Physical Exam: Blood pressure 123/82, pulse 66, temperature 97.6 F (36.4 C), temperature source Oral, resp. rate 17, height 5' 2 (1.575 m), weight 59 kg, SpO2 99%. General: pleasant, WD/WN female who is laying in bed in NAD HEENT: head is normocephalic, atraumatic.  Sclera are non-icteric.  Lungs:  Respiratory effort nonlabored Abd:  Soft, mild to moderate distension, epigastric and right sided ttp without rigidity or guarding. No masses, hernias, or organomegaly. Prior midline scar noted and well healed.  Skin: warm and dry  Psych: A&Ox4 with an appropriate affect  Results for orders placed or performed during the hospital encounter of 04/19/24 (from the past 48 hours)  Urinalysis, Routine w reflex microscopic -Urine, Clean Catch     Status: Abnormal   Collection Time: 04/19/24  7:59 PM  Result Value Ref Range   Color, Urine YELLOW YELLOW   APPearance HAZY  (A) CLEAR   Specific Gravity, Urine 1.011 1.005 - 1.030   pH 6.0 5.0 - 8.0   Glucose, UA NEGATIVE NEGATIVE mg/dL   Hgb urine dipstick NEGATIVE NEGATIVE   Bilirubin Urine NEGATIVE NEGATIVE   Ketones, ur NEGATIVE NEGATIVE mg/dL   Protein, ur 899 (A) NEGATIVE mg/dL   Nitrite NEGATIVE NEGATIVE   Leukocytes,Ua SMALL (A) NEGATIVE   RBC / HPF 0-5 0 - 5 RBC/hpf   WBC, UA 6-10 0 - 5 WBC/hpf   Bacteria, UA RARE (  A) NONE SEEN   Squamous Epithelial / HPF 0-5 0 - 5 /HPF   Mucus PRESENT     Comment: Performed at [facility], [address], [place], [place] [address]  Lipase, blood     Status: None   Collection Time: 04/19/24  8:08 PM  Result Value Ref Range   Lipase 36 11 - 51 U/L    Comment: Performed at [facility], [address], [place], [place] [address]  Comprehensive metabolic panel     Status: Abnormal   Collection Time: 04/19/24  8:08 PM  Result Value Ref Range   Sodium 142 135 - 145 mmol/L   Potassium 4.1 3.5 - 5.1 mmol/L   Chloride 110 98 - 111 mmol/L   CO2 20 (L) 22 - 32 mmol/L   Glucose, Bld 129 (H) 70 - 99 mg/dL    Comment: Glucose reference range applies only to samples taken after fasting for at least 8 hours.   BUN 22 8 - 23 mg/dL   Creatinine, Ser 8.65 (H) 0.44 - 1.00 mg/dL   Calcium  9.5 8.9 - 10.3 mg/dL   Total Protein 7.3 6.5 - 8.1 g/dL   Albumin 4.0 3.5 - 5.0 g/dL   AST 19 15 - 41 U/L   ALT 13 0 - 44 U/L   Alkaline Phosphatase 81 38 - 126 U/L   Total Bilirubin 0.7 0.0 - 1.2 mg/dL   GFR, Estimated 45 (L) >60 mL/min    Comment: (NOTE) Calculated using the CKD-EPI Creatinine Equation (2021)    Anion gap 12 5 - 15    Comment: Performed at [facility], [address], [place], [place] [address]  CBC     Status: None   Collection Time: 04/19/24  8:08 PM  Result Value Ref Range   WBC 7.3 4.0 - 10.5 K/uL   RBC 4.35 3.87 - 5.11 MIL/uL   Hemoglobin 13.5 12.0 - 15.0 g/dL   HCT 59.6 63.9 - 53.9 %   MCV 92.6 80.0 - 100.0 fL   MCH 31.0 26.0 - 34.0  pg   MCHC 33.5 30.0 - 36.0 g/dL   RDW 86.9 88.4 - 84.4 %   Platelets 221 150 - 400 K/uL   nRBC 0.0 0.0 - 0.2 %    Comment: Performed at [facility], [address] [place], [place] [address]  CBC     Status: None   Collection Time: 04/20/24  5:50 AM  Result Value Ref Range   WBC 5.5 4.0 - 10.5 K/uL   RBC 4.18 3.87 - 5.11 MIL/uL   Hemoglobin 12.9 12.0 - 15.0 g/dL   HCT 60.7 63.9 - 53.9 %   MCV 93.8 80.0 - 100.0 fL   MCH 30.9 26.0 - 34.0 pg   MCHC 32.9 30.0 - 36.0 g/dL   RDW 86.6 88.4 - 84.4 %   Platelets 198 150 - 400 K/uL   nRBC 0.0 0.0 - 0.2 %    Comment: Performed at [facility], [address] [place], [place] [address]  Basic metabolic panel     Status: Abnormal   Collection Time: 04/20/24  5:50 AM  Result Value Ref Range   Sodium 140 135 - 145 mmol/L   Potassium 3.5 3.5 - 5.1 mmol/L   Chloride 110 98 - 111 mmol/L   CO2 21 (L) 22 - 32 mmol/L   Glucose, Bld 109 (H) 70 - 99 mg/dL    Comment: Glucose reference  range applies only to samples taken after fasting for at least 8 hours.   BUN 20 8 - 23 mg/dL   Creatinine, Ser 8.64 (H) 0.44 - 1.00 mg/dL   Calcium  9.3 8.9 - 10.3 mg/dL   GFR, Estimated 44 (L) >60 mL/min    Comment: (NOTE) Calculated using the CKD-EPI Creatinine Equation (2021)    Anion gap 9 5 - 15    Comment: Performed at [facility], [address] [place], [place] [address]   DG Abd Portable 1V-Small Bowel Protocol-Position Verification Result Date: 04/20/2024 CLINICAL DATA:  63 year old female NG tube placement. EXAM: PORTABLE ABDOMEN - 1 VIEW COMPARISON:  CT Abdomen and Pelvis yesterday. FINDINGS: Portable AP semi upright view at 0621 hours. Enteric tube courses through the central mediastinum, terminates in the left abdomen with side hole at the level of the gastric body. Stable visible lung ventilation. Stable bowel gas pattern compared to the CT scout view yesterday. Scoliosis, extensive posterior and lower lumbar interbody spinal  fusion hardware. Left chest loop recorder or superficial ICD redemonstrated. IMPRESSION: Satisfactory enteric tube placement into the stomach. Electronically Signed   By: VEAR Hurst M.D.   On: 04/20/2024 07:08   CT ABDOMEN PELVIS W CONTRAST Result Date: 04/19/2024 CLINICAL DATA:  Acute abdominal pain. EXAM: CT ABDOMEN AND PELVIS WITH CONTRAST TECHNIQUE: Multidetector CT imaging of the abdomen and pelvis was performed using the standard protocol following bolus administration of intravenous contrast. RADIATION DOSE REDUCTION: This exam was performed according to the departmental dose-optimization program which includes automated exposure control, adjustment of the mA and/or kV according to patient size and/or use of iterative reconstruction technique. CONTRAST:  75mL OMNIPAQUE  IOHEXOL  350 MG/ML SOLN COMPARISON:  02/17/2024 FINDINGS: Lower chest: The lung bases are clear of an acute process. No worrisome pulmonary lesions or pulmonary nodules. No pericardial effusion. Hepatobiliary: No hepatic lesions or intrahepatic biliary dilatation. Gallbladder is mildly distended but no gallbladder wall thickening, pericholecystic fluid or obvious gallstones. Normal caliber common bile duct. Pancreas: No mass, inflammation or ductal dilatation. Spleen: Normal in size without focal abnormality. Adrenals/Urinary Tract: Adrenal glands are normal. Patient has a right pelvic kidney which demonstrates areas of renal cortical thinning and scarring. The kidney is small. The left kidney also demonstrates significant areas of renal cortical scarring but no worrisome renal lesions or hydronephrosis. The bladder is largely decompressed. No obvious abnormality. Stomach/Bowel: The stomach and duodenum are unremarkable. There are mildly dilated small bowel loops in the right upper quadrant. There is also mucosal fold thickening and wall thickening. Transition to nondilated/decompressed mid distal ileum near anastomotic bowel sutures. Findings  suspicious for a early or partial small obstruction. There is moderate stool throughout the colon and down into the rectum. No colonic abnormality. Vascular/Lymphatic: The aorta and branch vessels are patent. Stable scattered atherosclerotic calcifications. Stable 19 mm rim calcified splenic artery aneurysm. Reproductive: Surgically absent. Other: No free air or free fluid. Musculoskeletal: Thoracolumbar scoliosis with Harrington rods and extensive artifact. There is also a left total hip arthroplasty. No bone lesions or acute bony findings. IMPRESSION: 1. CT findings consistent with an early or partial small bowel obstruction. This is likely due to adhesions in the right lower abdomen. 2. No other significant abdominal/pelvic findings, mass lesions or adenopathy. 3. Stable 19 mm rim calcified splenic artery aneurysm. 4. Right pelvic kidney with areas of renal cortical thinning and scarring. 5. Aortic atherosclerosis. Aortic Atherosclerosis (ICD10-I70.0). Electronically Signed   By: MYRTIS Stammer M.D.   On: 04/19/2024 22:40  Anti-infectives (From admission, onward)    None       Assessment/Plan SBO - CT w/ mildly dilated small bowel loops in the RUQ with mucosal fold thickening and wall thickening and transition in the mid/distal ileum near prior anastomosis. No free air or free fluid - History of RIH repair when she was 63 years old, a bladder augmentation surgery at [facility] in 1988 that involved a SBR, abdominal hysterectomy, and anterior approach spine surgery in 2008.  - HDS without fever, tachycardia or hypotension on last set of vitals. No peritonitis on exam. WBC wnl. No current indication for emergency surgery - Continue NGT for decompression and keep NPO - Start SBO protocol after she has been able to decompress via NGT for a few hours  - Keep K >=4, Phos >= 3, Mg >= 2 and mobilize for bowel function. Okay to clamp NGT for mobilization.  - Hopefully patient will improve with conservative  management. If patient fails to improve with conservative management, they may require exploratory surgery during admission - We will follow with you.  FEN - NPO, NGT to LIWS, IVF per primary VTE - SCDs, okay for chem ppx from a general surgery standpoint ID - None Foley - Reports self caths at home  CKD3B Hypothyroidism HLD  I reviewed nursing notes, hospitalist notes, last 24 h vitals and pain scores, last 48 h intake and output, last 24 h labs and trends, and last 24 h imaging results.   Ozell CHRISTELLA Shaper, [facility] 04/20/2024, 8:10 AM Please see Amion for pager number during day hours 7:00am-4:30pm

## 2024-04-20 NOTE — Hospital Course (Addendum)
 Brief Narrative:   63 year old with history of abdominal surgery, SBO, asthma, chronic osteoarthritis, GAD, CKD 3B, hypothyroidism, L2 iliac wing instrumentation and fusion with displaced left iliac screw head requiring repair December thousand 24 presented to the ED with abdominal pain.  Patient found to have partial SBO likely secondary to radiation.  SBO protocol initiated, general surgery was consulted.  Assessment & Plan:  Principal Problem:   SBO (small bowel obstruction) (HCC) Active Problems:   CKD (chronic kidney disease) stage 3, GFR 30-59 ml/min (HCC)   Hypothyroidism   History of back surgery   History of asthma   GAD (generalized anxiety disorder)   Partial small bowel obstruction History of SBO abdominal surgery Small bowel protocol initiated..  General surgery team -treated with NG tube.  Abdominal x-ray appears to show some improvement.  Diet per general surgery.   AKI on CKD stage IIIb; improved.  - Baseline creatinine 1.0, admission creatinine 1.35, now resolved   Hypothyroidism -As patient is n.p.o. holding oral levothyroxine .   History of iliac crest surgery   History of asthma -Stable.     Generalized anxiety disorder -ResumePO meds     Hyperlipidemia -resume Crestor .     DVT prophylaxis: SCDs    Code Status: Full Code Family Communication:   Status is: Inpatient Remains inpatient appropriate because: Ongoing management for SBO   PT Follow up Recs:   Subjective: Feels okay no complaints Agreeable to try full liquid diet.   Examination:  General exam: Appears calm and comfortable, NG tube in place Respiratory system: Clear to auscultation. Respiratory effort normal. Cardiovascular system: S1 & S2 heard, RRR. No JVD, murmurs, rubs, gallops or clicks. No pedal edema. Gastrointestinal system: Abdomen is nondistended, diminished BS Central nervous system: Alert and oriented. No focal neurological deficits. Extremities: Symmetric 5 x 5  power. Skin: No rashes, lesions or ulcers Psychiatry: Judgement and insight appear normal. Mood & affect appropriate.

## 2024-04-21 DIAGNOSIS — K56609 Unspecified intestinal obstruction, unspecified as to partial versus complete obstruction: Secondary | ICD-10-CM | POA: Diagnosis not present

## 2024-04-21 LAB — CBC
HCT: 32 % — ABNORMAL LOW (ref 36.0–46.0)
Hemoglobin: 10.6 g/dL — ABNORMAL LOW (ref 12.0–15.0)
MCH: 31.4 pg (ref 26.0–34.0)
MCHC: 33.1 g/dL (ref 30.0–36.0)
MCV: 94.7 fL (ref 80.0–100.0)
Platelets: 148 K/uL — ABNORMAL LOW (ref 150–400)
RBC: 3.38 MIL/uL — ABNORMAL LOW (ref 3.87–5.11)
RDW: 13.5 % (ref 11.5–15.5)
WBC: 5.2 K/uL (ref 4.0–10.5)
nRBC: 0 % (ref 0.0–0.2)

## 2024-04-21 LAB — BASIC METABOLIC PANEL WITH GFR
Anion gap: 9 (ref 5–15)
BUN: 15 mg/dL (ref 8–23)
CO2: 24 mmol/L (ref 22–32)
Calcium: 8.9 mg/dL (ref 8.9–10.3)
Chloride: 111 mmol/L (ref 98–111)
Creatinine, Ser: 1.07 mg/dL — ABNORMAL HIGH (ref 0.44–1.00)
GFR, Estimated: 58 mL/min — ABNORMAL LOW (ref >60)
Glucose, Bld: 135 mg/dL — ABNORMAL HIGH (ref 70–99)
Potassium: 3.8 mmol/L (ref 3.5–5.1)
Sodium: 144 mmol/L (ref 135–145)

## 2024-04-21 LAB — PHOSPHORUS: Phosphorus: 2.8 mg/dL (ref 2.5–4.6)

## 2024-04-21 LAB — MAGNESIUM: Magnesium: 1.8 mg/dL (ref 1.7–2.4)

## 2024-04-21 MED ORDER — KETOROLAC TROMETHAMINE 15 MG/ML IJ SOLN
15.0000 mg | Freq: Four times a day (QID) | INTRAMUSCULAR | Status: DC | PRN
  Administered 2024-04-21 – 2024-04-22 (×2): 15 mg via INTRAVENOUS
  Filled 2024-04-21 (×2): qty 1

## 2024-04-21 MED ORDER — ACETAMINOPHEN 325 MG PO TABS
650.0000 mg | ORAL_TABLET | Freq: Four times a day (QID) | ORAL | Status: DC | PRN
  Administered 2024-04-23: 650 mg
  Filled 2024-04-21: qty 2

## 2024-04-21 MED ORDER — DEXTROSE-SODIUM CHLORIDE 5-0.45 % IV SOLN: INTRAVENOUS | Status: DC

## 2024-04-21 MED ORDER — OXYCODONE HCL 5 MG PO TABS: 5.0000 mg | ORAL_TABLET | ORAL | Status: DC | PRN

## 2024-04-21 MED ORDER — HYDROMORPHONE HCL 1 MG/ML IJ SOLN
0.5000 mg | INTRAMUSCULAR | Status: DC | PRN
  Administered 2024-04-21 – 2024-04-22 (×3): 1 mg via INTRAVENOUS
  Filled 2024-04-21 (×3): qty 1

## 2024-04-21 NOTE — Progress Notes (Signed)
 Mobility Specialist Progress Note:   04/21/24 1115  Mobility  Activity Ambulated with assistance (In hallway)  Level of Assistance Standby assist, set-up cues, supervision of patient - no hands on  Assistive Device None  Distance Ambulated (ft) 225 ft  Activity Response Tolerated well  Mobility Referral Yes  Mobility visit 1 Mobility  Mobility Specialist Start Time (ACUTE ONLY) 1103  Mobility Specialist Stop Time (ACUTE ONLY) 1113  Mobility Specialist Time Calculation (min) (ACUTE ONLY) 10 min   Received pt in bed and agreeable to mobility. No physical assistance needed. C/o abdominal pain, otherwise tolerated well. Returned to room without fault. Pt requested to use the BR. Pt instructed to pull call light once finished. NT aware. All needs met.  Lavanda Pollack Mobility Specialist  Please contact via [employer] or  Rehab Office [phone]

## 2024-04-21 NOTE — Progress Notes (Signed)
 Subjective: CC: Patient reports her epigastric and right sided abdominal pain is improved but continued. Bloating/distension improved as well. Patient with continued nausea. NGT with 450cc of bilious output in cannister. She is passing flatus. No BM. Xray with contrast in colon   Objective: Vital signs in last 24 hours: Temp:  [98 F (36.7 C)-98.7 F (37.1 C)] 98.3 F (36.8 C) (08/15 0735) Pulse Rate:  [61-69] 68 (08/15 0735) Resp:  [16] 16 (08/15 0735) BP: (101-123)/(70-86) 123/86 (08/15 0735) SpO2:  [96 %-100 %] 100 % (08/15 0735) Last BM Date : 04/18/24  Intake/Output from previous day: 08/14 0701 - 08/15 0700 In: 805.2 [I.V.:805.2] Out: 200 [Emesis/NG output:200] Intake/Output this shift: No intake/output data recorded.  PE: Gen:  Alert, NAD, pleasant Abd: Soft, mild distension, epigastric and right sided ttp without rigidity or guarding. NGT with bilious output.   Lab Results:  Recent Labs    04/20/24 0550 04/21/24 0701  WBC 5.5 5.2  HGB 12.9 10.6*  HCT 39.2 32.0*  PLT 198 148*   BMET Recent Labs    04/20/24 0550 04/21/24 0701  NA 140 144  K 3.5 3.8  CL 110 111  CO2 21* 24  GLUCOSE 109* 135*  BUN 20 15  CREATININE 1.35* 1.07*  CALCIUM  9.3 8.9   PT/INR No results for input(s): LABPROT, INR in the last 72 hours. CMP     Component Value Date/Time   NA 144 04/21/2024 0701   K 3.8 04/21/2024 0701   CL 111 04/21/2024 0701   CO2 24 04/21/2024 0701   GLUCOSE 135 (H) 04/21/2024 0701   BUN 15 04/21/2024 0701   CREATININE 1.07 (H) 04/21/2024 0701   CREATININE 1.39 (H) 12/14/2023 1018   CALCIUM  8.9 04/21/2024 0701   PROT 7.3 04/19/2024 2008   ALBUMIN 4.0 04/19/2024 2008   AST 19 04/19/2024 2008   AST 23 12/14/2023 1018   ALT 13 04/19/2024 2008   ALT 21 12/14/2023 1018   ALKPHOS 81 04/19/2024 2008   BILITOT 0.7 04/19/2024 2008   BILITOT 0.4 12/14/2023 1018   GFRNONAA 58 (L) 04/21/2024 0701   GFRNONAA 43 (L) 12/14/2023 1018   GFRAA 57  (L) 01/15/2020 2002   Lipase     Component Value Date/Time   LIPASE 36 04/19/2024 2008    Studies/Results: DG Abd Portable 1V-Small Bowel Obstruction Protocol-initial, 8 hr delay Result Date: 04/20/2024 CLINICAL DATA:  Small-bowel obstruction EXAM: PORTABLE ABDOMEN - 1 VIEW COMPARISON:  CT from the previous day. FINDINGS: Postsurgical changes are again noted and stable. Administered contrast now lies primarily within the colon consistent with a partial small bowel obstruction. No significant small bowel dilatation is seen. Contrast is noted within the bladder. Gastric catheter is seen in the stomach. IMPRESSION: Contrast material lies within the colon consistent with a partial small bowel obstruction. The degree of small-bowel dilatation has improved. Electronically Signed   By: Oneil Devonshire M.D.   On: 04/20/2024 23:37   DG Abd Portable 1V-Small Bowel Protocol-Position Verification Result Date: 04/20/2024 CLINICAL DATA:  63 year old female NG tube placement. EXAM: PORTABLE ABDOMEN - 1 VIEW COMPARISON:  CT Abdomen and Pelvis yesterday. FINDINGS: Portable AP semi upright view at 0621 hours. Enteric tube courses through the central mediastinum, terminates in the left abdomen with side hole at the level of the gastric body. Stable visible lung ventilation. Stable bowel gas pattern compared to the CT scout view yesterday. Scoliosis, extensive posterior and lower lumbar interbody spinal fusion hardware. Left chest  loop recorder or superficial ICD redemonstrated. IMPRESSION: Satisfactory enteric tube placement into the stomach. Electronically Signed   By: VEAR Hurst M.D.   On: 04/20/2024 07:08   CT ABDOMEN PELVIS W CONTRAST Result Date: 04/19/2024 CLINICAL DATA:  Acute abdominal pain. EXAM: CT ABDOMEN AND PELVIS WITH CONTRAST TECHNIQUE: Multidetector CT imaging of the abdomen and pelvis was performed using the standard protocol following bolus administration of intravenous contrast. RADIATION DOSE REDUCTION:  This exam was performed according to the departmental dose-optimization program which includes automated exposure control, adjustment of the mA and/or kV according to patient size and/or use of iterative reconstruction technique. CONTRAST:  75mL OMNIPAQUE  IOHEXOL  350 MG/ML SOLN COMPARISON:  02/17/2024 FINDINGS: Lower chest: The lung bases are clear of an acute process. No worrisome pulmonary lesions or pulmonary nodules. No pericardial effusion. Hepatobiliary: No hepatic lesions or intrahepatic biliary dilatation. Gallbladder is mildly distended but no gallbladder wall thickening, pericholecystic fluid or obvious gallstones. Normal caliber common bile duct. Pancreas: No mass, inflammation or ductal dilatation. Spleen: Normal in size without focal abnormality. Adrenals/Urinary Tract: Adrenal glands are normal. Patient has a right pelvic kidney which demonstrates areas of renal cortical thinning and scarring. The kidney is small. The left kidney also demonstrates significant areas of renal cortical scarring but no worrisome renal lesions or hydronephrosis. The bladder is largely decompressed. No obvious abnormality. Stomach/Bowel: The stomach and duodenum are unremarkable. There are mildly dilated small bowel loops in the right upper quadrant. There is also mucosal fold thickening and wall thickening. Transition to nondilated/decompressed mid distal ileum near anastomotic bowel sutures. Findings suspicious for a early or partial small obstruction. There is moderate stool throughout the colon and down into the rectum. No colonic abnormality. Vascular/Lymphatic: The aorta and branch vessels are patent. Stable scattered atherosclerotic calcifications. Stable 19 mm rim calcified splenic artery aneurysm. Reproductive: Surgically absent. Other: No free air or free fluid. Musculoskeletal: Thoracolumbar scoliosis with Harrington rods and extensive artifact. There is also a left total hip arthroplasty. No bone lesions or acute  bony findings. IMPRESSION: 1. CT findings consistent with an early or partial small bowel obstruction. This is likely due to adhesions in the right lower abdomen. 2. No other significant abdominal/pelvic findings, mass lesions or adenopathy. 3. Stable 19 mm rim calcified splenic artery aneurysm. 4. Right pelvic kidney with areas of renal cortical thinning and scarring. 5. Aortic atherosclerosis. Aortic Atherosclerosis (ICD10-I70.0). Electronically Signed   By: MYRTIS Stammer M.D.   On: 04/19/2024 22:40    Anti-infectives: Anti-infectives (From admission, onward)    None        Assessment/Plan SBO - CT w/ mildly dilated small bowel loops in the RUQ with mucosal fold thickening and wall thickening and transition in the mid/distal ileum near prior anastomosis. No free air or free fluid - History of RIH repair when she was 63 years old, a bladder augmentation surgery at [facility] in 1988 that involved a SBR, abdominal hysterectomy, and anterior approach spine surgery in 2008.  - HDS without fever, tachycardia or hypotension on last set of vitals. No peritonitis on exam. WBC wnl. No current indication for emergency surgery - Patient with contrast in colon on xray and passing flatus but continued pain, nausea and bilious output in NGT. Possible this is more of a pSBO picture. Will leave NPO and NGT on LIWS today. Repeat xray in the AM.  - Keep K >=4, Phos >= 3, Mg >= 2 and mobilize for bowel function. Okay to clamp NGT for mobilization.  -  Hopefully patient will improve with conservative management. If patient fails to improve with conservative management, they may require exploratory surgery during admission - We will follow with you.   FEN - NPO, NGT to LIWS, IVF per primary VTE - SCDs, okay for chem ppx from a general surgery standpoint ID - None Foley - Reports self caths at home   CKD3B Hypothyroidism HLD  I reviewed nursing notes, hospitalist notes, last 24 h vitals and pain scores, last 48 h  intake and output, last 24 h labs and trends, and last 24 h imaging results.   LOS: 1 day    Ozell CHRISTELLA Shaper, [facility] Surgery 04/21/2024, 9:32 AM Please see Amion for pager number during day hours 7:00am-4:30pm

## 2024-04-21 NOTE — Progress Notes (Signed)
 PROGRESS NOTE    Nicole Morris  FMW:[id number] DOB: 10-04-1960 DOA: 04/19/2024 PCP: Teresa Channel, MD    Brief Narrative:   63 year old with history of abdominal surgery, SBO, asthma, chronic osteoarthritis, GAD, CKD 3B, hypothyroidism, L2 iliac wing instrumentation and fusion with displaced left iliac screw head requiring repair December thousand 24 presented to the ED with abdominal pain.  Patient found to have partial SBO likely secondary to radiation.  SBO protocol initiated, general surgery was consulted.  Assessment & Plan:  Principal Problem:   SBO (small bowel obstruction) (HCC) Active Problems:   CKD (chronic kidney disease) stage 3, GFR 30-59 ml/min (HCC)   Hypothyroidism   History of back surgery   History of asthma   GAD (generalized anxiety disorder)   Partial small bowel obstruction History of SBO abdominal surgery Small bowel protocol initiated, NG tube placed.  General surgery team has been consulted.  Currently n.p.o., getting IV fluids   AKI on CKD stage IIIb - Baseline creatinine 1.0, admission creatinine 1.35, now resolved   Hypothyroidism -As patient is n.p.o. holding oral levothyroxine .   History of iliac crest surgery   History of asthma -Stable.     Generalized anxiety disorder -As patient is n.p.o. holding BuSpar, rizatriptan  and Zoloft .     Hyperlipidemia - As patient is n.p.o. holding Crestor .     DVT prophylaxis: SCDs    Code Status: Full Code Family Communication:   Status is: Inpatient Remains inpatient appropriate because: Ongoing management for SBO   PT Follow up Recs:   Subjective:  Feeling little better no new complaints Husband present at bedside  Examination:  General exam: Appears calm and comfortable, NG tube in place Respiratory system: Clear to auscultation. Respiratory effort normal. Cardiovascular system: S1 & S2 heard, RRR. No JVD, murmurs, rubs, gallops or clicks. No pedal edema. Gastrointestinal system:  Abdomen is nondistended, diminished BS Central nervous system: Alert and oriented. No focal neurological deficits. Extremities: Symmetric 5 x 5 power. Skin: No rashes, lesions or ulcers Psychiatry: Judgement and insight appear normal. Mood & affect appropriate.                Diet Orders (From admission, onward)     Start     Ordered   04/20/24 0454  Diet NPO time specified  Diet effective now        04/20/24 0453            Objective: Vitals:   04/20/24 1823 04/20/24 2209 04/21/24 0447 04/21/24 0735  BP: 115/80 122/80 114/70 123/86  Pulse: 69 61 68 68  Resp:  16 16 16   Temp:  98.7 F (37.1 C) 98 F (36.7 C) 98.3 F (36.8 C)  TempSrc:  Oral Oral Oral  SpO2: 100% 99% 99% 100%  Weight:      Height:        Intake/Output Summary (Last 24 hours) at 04/21/2024 1239 Last data filed at 04/21/2024 0900 Gross per 24 hour  Intake 758.21 ml  Output 200 ml  Net 558.21 ml   Filed Weights   04/19/24 1952  Weight: 59 kg    Scheduled Meds:  sodium chloride  flush  3 mL Intravenous Q12H   Continuous Infusions:  dextrose  5 % and 0.45 % NaCl      Nutritional status     Body mass index is 23.78 kg/m.  Data Reviewed:   CBC: Recent Labs  Lab 04/19/24 2008 04/20/24 0550 04/21/24 0701  WBC 7.3 5.5 5.2  HGB 13.5 12.9 10.6*  HCT 40.3 39.2 32.0*  MCV 92.6 93.8 94.7  PLT 221 198 148*   Basic Metabolic Panel: Recent Labs  Lab 04/19/24 2008 04/20/24 0550 04/21/24 0701  NA 142 140 144  K 4.1 3.5 3.8  CL 110 110 111  CO2 20* 21* 24  GLUCOSE 129* 109* 135*  BUN 22 20 15   CREATININE 1.34* 1.35* 1.07*  CALCIUM  9.5 9.3 8.9  MG  --   --  1.8  PHOS  --   --  2.8   GFR: Estimated Creatinine Clearance: 42.6 mL/min (A) (by C-G formula based on SCr of 1.07 mg/dL (H)). Liver Function Tests: Recent Labs  Lab 04/19/24 2008  AST 19  ALT 13  ALKPHOS 81  BILITOT 0.7  PROT 7.3  ALBUMIN 4.0   Recent Labs  Lab 04/19/24 2008  LIPASE 36   No results for  input(s): AMMONIA in the last 168 hours. Coagulation Profile: No results for input(s): INR, PROTIME in the last 168 hours. Cardiac Enzymes: No results for input(s): CKTOTAL, CKMB, CKMBINDEX, TROPONINI in the last 168 hours. BNP (last 3 results) No results for input(s): PROBNP in the last 8760 hours. HbA1C: No results for input(s): HGBA1C in the last 72 hours. CBG: No results for input(s): GLUCAP in the last 168 hours. Lipid Profile: No results for input(s): CHOL, HDL, LDLCALC, TRIG, CHOLHDL, LDLDIRECT in the last 72 hours. Thyroid  Function Tests: No results for input(s): TSH, T4TOTAL, FREET4, T3FREE, THYROIDAB in the last 72 hours. Anemia Panel: No results for input(s): VITAMINB12, FOLATE, FERRITIN, TIBC, IRON, RETICCTPCT in the last 72 hours. Sepsis Labs: No results for input(s): PROCALCITON, LATICACIDVEN in the last 168 hours.  No results found for this or any previous visit (from the past 240 hours).       Radiology Studies: DG Abd Portable 1V-Small Bowel Obstruction Protocol-initial, 8 hr delay Result Date: 04/20/2024 CLINICAL DATA:  Small-bowel obstruction EXAM: PORTABLE ABDOMEN - 1 VIEW COMPARISON:  CT from the previous day. FINDINGS: Postsurgical changes are again noted and stable. Administered contrast now lies primarily within the colon consistent with a partial small bowel obstruction. No significant small bowel dilatation is seen. Contrast is noted within the bladder. Gastric catheter is seen in the stomach. IMPRESSION: Contrast material lies within the colon consistent with a partial small bowel obstruction. The degree of small-bowel dilatation has improved. Electronically Signed   By: Oneil Devonshire M.D.   On: 04/20/2024 23:37   DG Abd Portable 1V-Small Bowel Protocol-Position Verification Result Date: 04/20/2024 CLINICAL DATA:  63 year old female NG tube placement. EXAM: PORTABLE ABDOMEN - 1 VIEW COMPARISON:  CT  Abdomen and Pelvis yesterday. FINDINGS: Portable AP semi upright view at 0621 hours. Enteric tube courses through the central mediastinum, terminates in the left abdomen with side hole at the level of the gastric body. Stable visible lung ventilation. Stable bowel gas pattern compared to the CT scout view yesterday. Scoliosis, extensive posterior and lower lumbar interbody spinal fusion hardware. Left chest loop recorder or superficial ICD redemonstrated. IMPRESSION: Satisfactory enteric tube placement into the stomach. Electronically Signed   By: VEAR Hurst M.D.   On: 04/20/2024 07:08   CT ABDOMEN PELVIS W CONTRAST Result Date: 04/19/2024 CLINICAL DATA:  Acute abdominal pain. EXAM: CT ABDOMEN AND PELVIS WITH CONTRAST TECHNIQUE: Multidetector CT imaging of the abdomen and pelvis was performed using the standard protocol following bolus administration of intravenous contrast. RADIATION DOSE REDUCTION: This exam was performed according to the departmental dose-optimization program which includes automated exposure control, adjustment  of the mA and/or kV according to patient size and/or use of iterative reconstruction technique. CONTRAST:  75mL OMNIPAQUE  IOHEXOL  350 MG/ML SOLN COMPARISON:  02/17/2024 FINDINGS: Lower chest: The lung bases are clear of an acute process. No worrisome pulmonary lesions or pulmonary nodules. No pericardial effusion. Hepatobiliary: No hepatic lesions or intrahepatic biliary dilatation. Gallbladder is mildly distended but no gallbladder wall thickening, pericholecystic fluid or obvious gallstones. Normal caliber common bile duct. Pancreas: No mass, inflammation or ductal dilatation. Spleen: Normal in size without focal abnormality. Adrenals/Urinary Tract: Adrenal glands are normal. Patient has a right pelvic kidney which demonstrates areas of renal cortical thinning and scarring. The kidney is small. The left kidney also demonstrates significant areas of renal cortical scarring but no  worrisome renal lesions or hydronephrosis. The bladder is largely decompressed. No obvious abnormality. Stomach/Bowel: The stomach and duodenum are unremarkable. There are mildly dilated small bowel loops in the right upper quadrant. There is also mucosal fold thickening and wall thickening. Transition to nondilated/decompressed mid distal ileum near anastomotic bowel sutures. Findings suspicious for a early or partial small obstruction. There is moderate stool throughout the colon and down into the rectum. No colonic abnormality. Vascular/Lymphatic: The aorta and branch vessels are patent. Stable scattered atherosclerotic calcifications. Stable 19 mm rim calcified splenic artery aneurysm. Reproductive: Surgically absent. Other: No free air or free fluid. Musculoskeletal: Thoracolumbar scoliosis with Harrington rods and extensive artifact. There is also a left total hip arthroplasty. No bone lesions or acute bony findings. IMPRESSION: 1. CT findings consistent with an early or partial small bowel obstruction. This is likely due to adhesions in the right lower abdomen. 2. No other significant abdominal/pelvic findings, mass lesions or adenopathy. 3. Stable 19 mm rim calcified splenic artery aneurysm. 4. Right pelvic kidney with areas of renal cortical thinning and scarring. 5. Aortic atherosclerosis. Aortic Atherosclerosis (ICD10-I70.0). Electronically Signed   By: MYRTIS Stammer M.D.   On: 04/19/2024 22:40           LOS: 1 day   Time spent= 35 mins    Burgess JAYSON Dare, MD [facility]  If 7PM-7AM, please contact night-coverage  04/21/2024, 12:39 PM

## 2024-04-21 NOTE — Plan of Care (Signed)
   Problem: Education: Goal: Knowledge of General Education information will improve Description Including pain rating scale, medication(s)/side effects and non-pharmacologic comfort measures Outcome: Progressing

## 2024-04-22 ENCOUNTER — Inpatient Hospital Stay (HOSPITAL_COMMUNITY)

## 2024-04-22 DIAGNOSIS — K56609 Unspecified intestinal obstruction, unspecified as to partial versus complete obstruction: Secondary | ICD-10-CM | POA: Diagnosis not present

## 2024-04-22 LAB — BASIC METABOLIC PANEL WITH GFR
Anion gap: 5 (ref 5–15)
BUN: 12 mg/dL (ref 8–23)
CO2: 26 mmol/L (ref 22–32)
Calcium: 8.7 mg/dL — ABNORMAL LOW (ref 8.9–10.3)
Chloride: 110 mmol/L (ref 98–111)
Creatinine, Ser: 1.05 mg/dL — ABNORMAL HIGH (ref 0.44–1.00)
GFR, Estimated: 60 mL/min — ABNORMAL LOW (ref >60)
Glucose, Bld: 121 mg/dL — ABNORMAL HIGH (ref 70–99)
Potassium: 3.7 mmol/L (ref 3.5–5.1)
Sodium: 141 mmol/L (ref 135–145)

## 2024-04-22 LAB — CBC
HCT: 30.6 % — ABNORMAL LOW (ref 36.0–46.0)
Hemoglobin: 10.1 g/dL — ABNORMAL LOW (ref 12.0–15.0)
MCH: 30.9 pg (ref 26.0–34.0)
MCHC: 33 g/dL (ref 30.0–36.0)
MCV: 93.6 fL (ref 80.0–100.0)
Platelets: 138 K/uL — ABNORMAL LOW (ref 150–400)
RBC: 3.27 MIL/uL — ABNORMAL LOW (ref 3.87–5.11)
RDW: 13.2 % (ref 11.5–15.5)
WBC: 5.6 K/uL (ref 4.0–10.5)
nRBC: 0 % (ref 0.0–0.2)

## 2024-04-22 LAB — MAGNESIUM: Magnesium: 1.7 mg/dL (ref 1.7–2.4)

## 2024-04-22 MED ORDER — DEXTROSE-SODIUM CHLORIDE 5-0.45 % IV SOLN: INTRAVENOUS | Status: AC

## 2024-04-22 MED ORDER — POTASSIUM CHLORIDE 20 MEQ PO PACK
40.0000 meq | PACK | Freq: Once | ORAL | Status: AC
  Administered 2024-04-22: 40 meq via ORAL
  Filled 2024-04-22: qty 2

## 2024-04-22 MED ORDER — MAGNESIUM OXIDE -MG SUPPLEMENT 400 (240 MG) MG PO TABS
800.0000 mg | ORAL_TABLET | Freq: Once | ORAL | Status: AC
  Administered 2024-04-22: 800 mg via ORAL
  Filled 2024-04-22: qty 2

## 2024-04-22 MED ORDER — POTASSIUM CHLORIDE 10 MEQ/100ML IV SOLN
10.0000 meq | INTRAVENOUS | Status: DC
  Filled 2024-04-22: qty 100

## 2024-04-22 MED ORDER — MAGNESIUM SULFATE 2 GM/50ML IV SOLN
2.0000 g | Freq: Once | INTRAVENOUS | Status: DC
  Filled 2024-04-22: qty 50

## 2024-04-22 NOTE — Progress Notes (Signed)
 Mobility Specialist Progress Note:    04/22/24 1443  Mobility  Activity Ambulated with assistance  Level of Assistance Standby assist, set-up cues, supervision of patient - no hands on  Assistive Device None  Distance Ambulated (ft) 500 ft  Activity Response Tolerated well  Mobility Referral Yes  Mobility visit 1 Mobility  Mobility Specialist Start Time (ACUTE ONLY) 1443  Mobility Specialist Stop Time (ACUTE ONLY) 1455  Mobility Specialist Time Calculation (min) (ACUTE ONLY) 12 min   Pt pleasant and eager for session. Able to move in/out of bed and ambulate w/ little to no assist. No c/o any symptoms but noticed pt seemed to keep drifting to her left w/o her noticing. Returned pt to room w/husband in room and all needs met.   Venetia Keel Mobility Specialist Please Neurosurgeon or Rehab Office at [phone]

## 2024-04-22 NOTE — Plan of Care (Signed)
  Problem: Pain Managment: Goal: General experience of comfort will improve and/or be controlled Outcome: Progressing   Problem: Safety: Goal: Ability to remain free from injury will improve Outcome: Progressing

## 2024-04-22 NOTE — Plan of Care (Signed)
  Problem: Clinical Measurements: Goal: Respiratory complications will improve Outcome: Progressing   Problem: Activity: Goal: Risk for activity intolerance will decrease Outcome: Progressing   Problem: Nutrition: Goal: Adequate nutrition will be maintained Outcome: Not Progressing   Problem: Coping: Goal: Level of anxiety will decrease Outcome: Progressing   Problem: Safety: Goal: Ability to remain free from injury will improve Outcome: Progressing

## 2024-04-22 NOTE — Progress Notes (Signed)
 Pt is vomiting after 40 mL flush. Notified Dr. Caleen via secure chat that pt is vomiting and I place her back to low intermittent suction. Spoke to charge RN and felt like that would be the best option until tomorrow. Emesis 5 mL. Will endorse to ongoing shift about the situation.

## 2024-04-22 NOTE — Progress Notes (Signed)
 PROGRESS NOTE    Nicole Morris  FMW:[id number] DOB: 1961/05/23 DOA: 04/19/2024 PCP: Teresa Channel, MD    Brief Narrative:   63 year old with history of abdominal surgery, SBO, asthma, chronic osteoarthritis, GAD, CKD 3B, hypothyroidism, L2 iliac wing instrumentation and fusion with displaced left iliac screw head requiring repair December thousand 24 presented to the ED with abdominal pain.  Patient found to have partial SBO likely secondary to radiation.  SBO protocol initiated, general surgery was consulted.  Assessment & Plan:  Principal Problem:   SBO (small bowel obstruction) (HCC) Active Problems:   CKD (chronic kidney disease) stage 3, GFR 30-59 ml/min (HCC)   Hypothyroidism   History of back surgery   History of asthma   GAD (generalized anxiety disorder)   Partial small bowel obstruction History of SBO abdominal surgery Small bowel protocol initiated..  General surgery team - will adv diet today. Clamp her NG today.    AKI on CKD stage IIIb - Baseline creatinine 1.0, admission creatinine 1.35, now resolved   Hypothyroidism -As patient is n.p.o. holding oral levothyroxine .   History of iliac crest surgery   History of asthma -Stable.     Generalized anxiety disorder -As patient is n.p.o. holding BuSpar, rizatriptan  and Zoloft .     Hyperlipidemia - As patient is n.p.o. holding Crestor .     DVT prophylaxis: SCDs    Code Status: Full Code Family Communication:   Status is: Inpatient Remains inpatient appropriate because: Ongoing management for SBO   PT Follow up Recs:   Subjective: Doing better today Passed gas overnight.    Examination:  General exam: Appears calm and comfortable, NG tube in place Respiratory system: Clear to auscultation. Respiratory effort normal. Cardiovascular system: S1 & S2 heard, RRR. No JVD, murmurs, rubs, gallops or clicks. No pedal edema. Gastrointestinal system: Abdomen is nondistended, diminished BS Central  nervous system: Alert and oriented. No focal neurological deficits. Extremities: Symmetric 5 x 5 power. Skin: No rashes, lesions or ulcers Psychiatry: Judgement and insight appear normal. Mood & affect appropriate.                Diet Orders (From admission, onward)     Start     Ordered   04/22/24 0912  Diet clear liquid Room service appropriate? Yes; Fluid consistency: Thin  Diet effective now       Question Answer Comment  Room service appropriate? Yes   Fluid consistency: Thin      04/22/24 0912            Objective: Vitals:   04/21/24 1602 04/21/24 2159 04/22/24 0406 04/22/24 0829  BP: 125/77 (!) 143/74 127/70 135/78  Pulse: 76 92 74 67  Resp: 16   20  Temp: (!) 97.4 F (36.3 C) 97.7 F (36.5 C) 98.6 F (37 C) 98.2 F (36.8 C)  TempSrc: Oral   Oral  SpO2: 100% 100% 100% 100%  Weight:      Height:        Intake/Output Summary (Last 24 hours) at 04/22/2024 1138 Last data filed at 04/22/2024 0856 Gross per 24 hour  Intake 897.87 ml  Output 1450 ml  Net -552.13 ml   Filed Weights   04/19/24 1952  Weight: 59 kg    Scheduled Meds:  sodium chloride  flush  3 mL Intravenous Q12H   Continuous Infusions:  dextrose  5 % and 0.45 % NaCl 75 mL/hr at 04/22/24 0933    Nutritional status     Body mass index is 23.78 kg/m.  Data Reviewed:   CBC: Recent Labs  Lab 04/19/24 2008 04/20/24 0550 04/21/24 0701 04/22/24 0743  WBC 7.3 5.5 5.2 5.6  HGB 13.5 12.9 10.6* 10.1*  HCT 40.3 39.2 32.0* 30.6*  MCV 92.6 93.8 94.7 93.6  PLT 221 198 148* 138*   Basic Metabolic Panel: Recent Labs  Lab 04/19/24 2008 04/20/24 0550 04/21/24 0701 04/22/24 0743  NA 142 140 144 141  K 4.1 3.5 3.8 3.7  CL 110 110 111 110  CO2 20* 21* 24 26  GLUCOSE 129* 109* 135* 121*  BUN 22 20 15 12   CREATININE 1.34* 1.35* 1.07* 1.05*  CALCIUM  9.5 9.3 8.9 8.7*  MG  --   --  1.8 1.7  PHOS  --   --  2.8  --    GFR: Estimated Creatinine Clearance: 43.4 mL/min (A) (by C-G  formula based on SCr of 1.05 mg/dL (H)). Liver Function Tests: Recent Labs  Lab 04/19/24 2008  AST 19  ALT 13  ALKPHOS 81  BILITOT 0.7  PROT 7.3  ALBUMIN 4.0   Recent Labs  Lab 04/19/24 2008  LIPASE 36   No results for input(s): AMMONIA in the last 168 hours. Coagulation Profile: No results for input(s): INR, PROTIME in the last 168 hours. Cardiac Enzymes: No results for input(s): CKTOTAL, CKMB, CKMBINDEX, TROPONINI in the last 168 hours. BNP (last 3 results) No results for input(s): PROBNP in the last 8760 hours. HbA1C: No results for input(s): HGBA1C in the last 72 hours. CBG: No results for input(s): GLUCAP in the last 168 hours. Lipid Profile: No results for input(s): CHOL, HDL, LDLCALC, TRIG, CHOLHDL, LDLDIRECT in the last 72 hours. Thyroid  Function Tests: No results for input(s): TSH, T4TOTAL, FREET4, T3FREE, THYROIDAB in the last 72 hours. Anemia Panel: No results for input(s): VITAMINB12, FOLATE, FERRITIN, TIBC, IRON, RETICCTPCT in the last 72 hours. Sepsis Labs: No results for input(s): PROCALCITON, LATICACIDVEN in the last 168 hours.  No results found for this or any previous visit (from the past 240 hours).       Radiology Studies: DG Abd Portable 1V Result Date: 04/22/2024 CLINICAL DATA:  Small-bowel obstruction EXAM: DG ABD PORTABLE 1V COMPARISON:  A 1425 FINDINGS: 2 supine frontal views of the abdomen and pelvis demonstrate enteric catheter tip projecting over the gastric antrum. No evidence of small bowel distension. Oral contrast has progressed throughout the colon by the time of imaging. No masses or abnormal calcifications. Extensive postsurgical changes of the lumbosacral spine and left hip again noted. IMPRESSION: 1. No evidence of small-bowel obstruction. Progression of oral contrast throughout the colon. Electronically Signed   By: Ozell Daring M.D.   On: 04/22/2024 10:13   DG Abd Portable  1V-Small Bowel Obstruction Protocol-initial, 8 hr delay Result Date: 04/20/2024 CLINICAL DATA:  Small-bowel obstruction EXAM: PORTABLE ABDOMEN - 1 VIEW COMPARISON:  CT from the previous day. FINDINGS: Postsurgical changes are again noted and stable. Administered contrast now lies primarily within the colon consistent with a partial small bowel obstruction. No significant small bowel dilatation is seen. Contrast is noted within the bladder. Gastric catheter is seen in the stomach. IMPRESSION: Contrast material lies within the colon consistent with a partial small bowel obstruction. The degree of small-bowel dilatation has improved. Electronically Signed   By: Oneil Devonshire M.D.   On: 04/20/2024 23:37           LOS: 2 days   Time spent= 35 mins    Olie Scaffidi JAYSON Dare, MD [facility]  If  7PM-7AM, please contact night-coverage  04/22/2024, 11:38 AM

## 2024-04-22 NOTE — Progress Notes (Signed)
 Assessment & Plan: SBO - CT w/ mildly dilated small bowel loops in the RUQ with mucosal fold thickening and wall thickening and transition in the mid/distal ileum near prior anastomosis. No free air or free fluid - History of RIH repair when she was 63 years old, a bladder augmentation surgery at [facility] in 1988 that involved a SBR, abdominal hysterectomy, and anterior approach spine surgery in 2008.  - AXR this AM - contrast in colon - will clamp NG tube now - begin trial of CL diet - encouraged OOB, ambulation   FEN - CLD, NGT clamped, IVF per primary VTE - SCDs, okay for chem ppx from a general surgery standpoint ID - None Foley - Reports self caths at home   CKD3B Hypothyroidism HLD  Will follow with you.        [name], MD [facility] practice Office: [phone]        Chief Complaint: SBO  Subjective: Patient in bed, complains of left flank pain.  Family at bedside.  Objective: Vital signs in last 24 hours: Temp:  [97.4 F (36.3 C)-98.6 F (37 C)] 98.2 F (36.8 C) (08/16 0829) Pulse Rate:  [67-92] 67 (08/16 0829) Resp:  [16-20] 20 (08/16 0829) BP: (125-143)/(70-78) 135/78 (08/16 0829) SpO2:  [100 %] 100 % (08/16 0829) Last BM Date : 04/18/24  Intake/Output from previous day: 08/15 0701 - 08/16 0700 In: 857.9 [I.V.:857.9] Out: 1050 [Emesis/NG output:1050] Intake/Output this shift: Total I/O In: 40 [NG/GT:40] Out: 400 [Emesis/NG output:400]  Physical Exam: HEENT - sclerae clear, mucous membranes moist Abdomen - soft, mild distension, mild diffuse tenderness; NG bilious  Lab Results:  Recent Labs    04/21/24 0701 04/22/24 0743  WBC 5.2 5.6  HGB 10.6* 10.1*  HCT 32.0* 30.6*  PLT 148* 138*   BMET Recent Labs    04/21/24 0701 04/22/24 0743  NA 144 141  K 3.8 3.7  CL 111 110  CO2 24 26  GLUCOSE 135* 121*  BUN 15 12  CREATININE 1.07* 1.05*  CALCIUM  8.9 8.7*   PT/INR No results for input(s): LABPROT,  INR in the last 72 hours. Comprehensive Metabolic Panel:    Component Value Date/Time   NA 141 04/22/2024 0743   NA 144 04/21/2024 0701   K 3.7 04/22/2024 0743   K 3.8 04/21/2024 0701   CL 110 04/22/2024 0743   CL 111 04/21/2024 0701   CO2 26 04/22/2024 0743   CO2 24 04/21/2024 0701   BUN 12 04/22/2024 0743   BUN 15 04/21/2024 0701   CREATININE 1.05 (H) 04/22/2024 0743   CREATININE 1.07 (H) 04/21/2024 0701   CREATININE 1.39 (H) 12/14/2023 1018   CREATININE 1.20 (H) 06/29/2023 0958   GLUCOSE 121 (H) 04/22/2024 0743   GLUCOSE 135 (H) 04/21/2024 0701   CALCIUM  8.7 (L) 04/22/2024 0743   CALCIUM  8.9 04/21/2024 0701   AST 19 04/19/2024 2008   AST 21 02/17/2024 1254   AST 23 12/14/2023 1018   AST 18 06/29/2023 0958   ALT 13 04/19/2024 2008   ALT 16 02/17/2024 1254   ALT 21 12/14/2023 1018   ALT 13 06/29/2023 0958   ALKPHOS 81 04/19/2024 2008   ALKPHOS 88 02/17/2024 1254   BILITOT 0.7 04/19/2024 2008   BILITOT 0.7 02/17/2024 1254   BILITOT 0.4 12/14/2023 1018   BILITOT 0.4 06/29/2023 0958   PROT 7.3 04/19/2024 2008   PROT 6.7 02/17/2024 1254   ALBUMIN 4.0 04/19/2024 2008   ALBUMIN  4.2 02/17/2024 1254    Studies/Results: DG Abd Portable 1V-Small Bowel Obstruction Protocol-initial, 8 hr delay Result Date: 04/20/2024 CLINICAL DATA:  Small-bowel obstruction EXAM: PORTABLE ABDOMEN - 1 VIEW COMPARISON:  CT from the previous day. FINDINGS: Postsurgical changes are again noted and stable. Administered contrast now lies primarily within the colon consistent with a partial small bowel obstruction. No significant small bowel dilatation is seen. Contrast is noted within the bladder. Gastric catheter is seen in the stomach. IMPRESSION: Contrast material lies within the colon consistent with a partial small bowel obstruction. The degree of small-bowel dilatation has improved. Electronically Signed   By: Oneil Devonshire M.D.   On: 04/20/2024 23:37      [name] 04/22/2024  Patient ID:  Donny Hagedorn, female   DOB: 27-May-1961, 63 y.o.   MRN: [id number]

## 2024-04-23 ENCOUNTER — Inpatient Hospital Stay (HOSPITAL_COMMUNITY)

## 2024-04-23 DIAGNOSIS — K56609 Unspecified intestinal obstruction, unspecified as to partial versus complete obstruction: Secondary | ICD-10-CM | POA: Diagnosis not present

## 2024-04-23 LAB — CBC
HCT: 30.5 % — ABNORMAL LOW (ref 36.0–46.0)
Hemoglobin: 10.2 g/dL — ABNORMAL LOW (ref 12.0–15.0)
MCH: 31.4 pg (ref 26.0–34.0)
MCHC: 33.4 g/dL (ref 30.0–36.0)
MCV: 93.8 fL (ref 80.0–100.0)
Platelets: 137 K/uL — ABNORMAL LOW (ref 150–400)
RBC: 3.25 MIL/uL — ABNORMAL LOW (ref 3.87–5.11)
RDW: 12.8 % (ref 11.5–15.5)
WBC: 4.6 K/uL (ref 4.0–10.5)
nRBC: 0 % (ref 0.0–0.2)

## 2024-04-23 LAB — BASIC METABOLIC PANEL WITH GFR
Anion gap: 7 (ref 5–15)
BUN: 12 mg/dL (ref 8–23)
CO2: 27 mmol/L (ref 22–32)
Calcium: 8.7 mg/dL — ABNORMAL LOW (ref 8.9–10.3)
Chloride: 108 mmol/L (ref 98–111)
Creatinine, Ser: 1.08 mg/dL — ABNORMAL HIGH (ref 0.44–1.00)
GFR, Estimated: 58 mL/min — ABNORMAL LOW (ref >60)
Glucose, Bld: 114 mg/dL — ABNORMAL HIGH (ref 70–99)
Potassium: 3.7 mmol/L (ref 3.5–5.1)
Sodium: 142 mmol/L (ref 135–145)

## 2024-04-23 LAB — MAGNESIUM: Magnesium: 1.8 mg/dL (ref 1.7–2.4)

## 2024-04-23 MED ORDER — DEXTROSE-SODIUM CHLORIDE 5-0.45 % IV SOLN: INTRAVENOUS | Status: AC

## 2024-04-23 NOTE — Plan of Care (Signed)
  Problem: Clinical Measurements: Goal: Diagnostic test results will improve Outcome: Progressing   Problem: Nutrition: Goal: Adequate nutrition will be maintained Outcome: Progressing   Problem: Pain Managment: Goal: General experience of comfort will improve and/or be controlled Outcome: Progressing

## 2024-04-23 NOTE — Progress Notes (Addendum)
 D/c NGT per order, pt tolerated well. Started with a ginger ale. Mobility tech came by to ambulated patient. Will start with full liquid diet per order. Held IVF due to pt starting diet.

## 2024-04-23 NOTE — Progress Notes (Signed)
 PROGRESS NOTE    Nicole Morris  FMW:[id number] DOB: [birth year] DOA: 04/19/2024 PCP: Teresa Channel, MD    Brief Narrative:   63 year old with history of abdominal surgery, SBO, asthma, chronic osteoarthritis, GAD, CKD 3B, hypothyroidism, L2 iliac wing instrumentation and fusion with displaced left iliac screw head requiring repair December thousand 24 presented to the ED with abdominal pain.  Patient found to have partial SBO likely secondary to radiation.  SBO protocol initiated, general surgery was consulted.  Assessment & Plan:  Principal Problem:   SBO (small bowel obstruction) (HCC) Active Problems:   CKD (chronic kidney disease) stage 3, GFR 30-59 ml/min (HCC)   Hypothyroidism   History of back surgery   History of asthma   GAD (generalized anxiety disorder)   Partial small bowel obstruction History of SBO abdominal surgery Small bowel protocol initiated..  General surgery team -treated with NG tube.  Abdominal x-ray appears to show some improvement.  Diet per general surgery.   AKI on CKD stage IIIb - Baseline creatinine 1.0, admission creatinine 1.35, now resolved   Hypothyroidism -As patient is n.p.o. holding oral levothyroxine .   History of iliac crest surgery   History of asthma -Stable.     Generalized anxiety disorder -As patient is n.p.o. holding BuSpar, rizatriptan  and Zoloft .     Hyperlipidemia - As patient is n.p.o. holding Crestor .     DVT prophylaxis: SCDs    Code Status: Full Code Family Communication:   Status is: Inpatient Remains inpatient appropriate because: Ongoing management for SBO   PT Follow up Recs:   Subjective: Feels okay no complaints  Examination:  General exam: Appears calm and comfortable, NG tube in place Respiratory system: Clear to auscultation. Respiratory effort normal. Cardiovascular system: S1 & S2 heard, RRR. No JVD, murmurs, rubs, gallops or clicks. No pedal edema. Gastrointestinal system: Abdomen is  nondistended, diminished BS Central nervous system: Alert and oriented. No focal neurological deficits. Extremities: Symmetric 5 x 5 power. Skin: No rashes, lesions or ulcers Psychiatry: Judgement and insight appear normal. Mood & affect appropriate.                Diet Orders (From admission, onward)     Start     Ordered   04/23/24 0838  Diet NPO time specified Except for: Ice Chips, Sips with Meds  Diet effective now       Question Answer Comment  Except for Ice Chips   Except for Sips with Meds      04/23/24 0837            Objective: Vitals:   04/22/24 1548 04/22/24 2142 04/23/24 0534 04/23/24 0733  BP: 121/69 134/84 133/82 127/78  Pulse: 69 71 62 64  Resp: 20 18 18 16   Temp: 98.6 F (37 C) 99.3 F (37.4 C) 97.7 F (36.5 C) 98.3 F (36.8 C)  TempSrc: Oral Oral Oral Oral  SpO2: 100% 100% 99% 100%  Weight:      Height:        Intake/Output Summary (Last 24 hours) at 04/23/2024 1030 Last data filed at 04/22/2024 1832 Gross per 24 hour  Intake 654.12 ml  Output 500 ml  Net 154.12 ml   Filed Weights   04/19/24 1952  Weight: 59 kg    Scheduled Meds:  sodium chloride  flush  3 mL Intravenous Q12H   Continuous Infusions:  Nutritional status     Body mass index is 23.78 kg/m.  Data Reviewed:   CBC: Recent Labs  Lab 04/19/24  2008 04/20/24 0550 04/21/24 0701 04/22/24 0743 04/23/24 0736  WBC 7.3 5.5 5.2 5.6 4.6  HGB 13.5 12.9 10.6* 10.1* 10.2*  HCT 40.3 39.2 32.0* 30.6* 30.5*  MCV 92.6 93.8 94.7 93.6 93.8  PLT 221 198 148* 138* 137*   Basic Metabolic Panel: Recent Labs  Lab 04/19/24 2008 04/20/24 0550 04/21/24 0701 04/22/24 0743 04/23/24 0736  NA 142 140 144 141 142  K 4.1 3.5 3.8 3.7 3.7  CL 110 110 111 110 108  CO2 20* 21* 24 26 27   GLUCOSE 129* 109* 135* 121* 114*  BUN 22 20 15 12 12   CREATININE 1.34* 1.35* 1.07* 1.05* 1.08*  CALCIUM  9.5 9.3 8.9 8.7* 8.7*  MG  --   --  1.8 1.7 1.8  PHOS  --   --  2.8  --   --     GFR: Estimated Creatinine Clearance: 42.2 mL/min (A) (by C-G formula based on SCr of 1.08 mg/dL (H)). Liver Function Tests: Recent Labs  Lab 04/19/24 2008  AST 19  ALT 13  ALKPHOS 81  BILITOT 0.7  PROT 7.3  ALBUMIN 4.0   Recent Labs  Lab 04/19/24 2008  LIPASE 36   No results for input(s): AMMONIA in the last 168 hours. Coagulation Profile: No results for input(s): INR, PROTIME in the last 168 hours. Cardiac Enzymes: No results for input(s): CKTOTAL, CKMB, CKMBINDEX, TROPONINI in the last 168 hours. BNP (last 3 results) No results for input(s): PROBNP in the last 8760 hours. HbA1C: No results for input(s): HGBA1C in the last 72 hours. CBG: No results for input(s): GLUCAP in the last 168 hours. Lipid Profile: No results for input(s): CHOL, HDL, LDLCALC, TRIG, CHOLHDL, LDLDIRECT in the last 72 hours. Thyroid  Function Tests: No results for input(s): TSH, T4TOTAL, FREET4, T3FREE, THYROIDAB in the last 72 hours. Anemia Panel: No results for input(s): VITAMINB12, FOLATE, FERRITIN, TIBC, IRON, RETICCTPCT in the last 72 hours. Sepsis Labs: No results for input(s): PROCALCITON, LATICACIDVEN in the last 168 hours.  No results found for this or any previous visit (from the past 240 hours).       Radiology Studies: DG Abd Portable 1V Result Date: 04/22/2024 CLINICAL DATA:  Small-bowel obstruction EXAM: DG ABD PORTABLE 1V COMPARISON:  A 1425 FINDINGS: 2 supine frontal views of the abdomen and pelvis demonstrate enteric catheter tip projecting over the gastric antrum. No evidence of small bowel distension. Oral contrast has progressed throughout the colon by the time of imaging. No masses or abnormal calcifications. Extensive postsurgical changes of the lumbosacral spine and left hip again noted. IMPRESSION: 1. No evidence of small-bowel obstruction. Progression of oral contrast throughout the colon. Electronically Signed    By: Ozell Daring M.D.   On: 04/22/2024 10:13           LOS: 3 days   Time spent= 35 mins    Florencia Zaccaro JAYSON Dare, MD [facility]  If 7PM-7AM, please contact night-coverage  04/23/2024, 10:30 AM

## 2024-04-23 NOTE — Progress Notes (Signed)
 Mobility Specialist Progress Note:    04/23/24 1451  Mobility  Activity Ambulated independently  Level of Assistance Standby assist, set-up cues, supervision of patient - no hands on  Assistive Device None  Distance Ambulated (ft) 500 ft  Activity Response Tolerated well  Mobility Referral Yes  Mobility visit 1 Mobility  Mobility Specialist Start Time (ACUTE ONLY) 1451  Mobility Specialist Stop Time (ACUTE ONLY) 1459  Mobility Specialist Time Calculation (min) (ACUTE ONLY) 8 min   Pt agreeable to session and feeling better after tube and iv removal. Complaint of slight stomach pain of known reason but otherwise fine. Pt ambulating faster but still has left sided drift. Returned pt to room w/ all needs met and husband in room.   Venetia Keel Mobility Specialist Please Neurosurgeon or Rehab Office at [phone]

## 2024-04-23 NOTE — Plan of Care (Signed)
  Problem: Pain Managment: Goal: General experience of comfort will improve and/or be controlled Outcome: Progressing   Problem: Safety: Goal: Ability to remain free from injury will improve Outcome: Progressing

## 2024-04-23 NOTE — Progress Notes (Signed)
 Assessment & Plan: SBO - recurrent nausea, emesis with clamping trial - back on suction  - will repeat AXR this AM - NG tube to suction - NPO - encouraged OOB, ambulation   FEN - NPO, NGT to suction, IVF per primary VTE - SCDs, okay for chem ppx from a general surgery standpoint ID - None Foley - Reports self caths at home   CKD3B Hypothyroidism HLD   Will follow with you.        [name], MD [facility] practice Office: [phone]        Chief Complaint: SBO  Subjective: Patient comfortable, passing flatus, no BM.  Objective: Vital signs in last 24 hours: Temp:  [97.7 F (36.5 C)-99.3 F (37.4 C)] 98.3 F (36.8 C) (08/17 0733) Pulse Rate:  [62-71] 64 (08/17 0733) Resp:  [16-20] 16 (08/17 0733) BP: (121-134)/(69-84) 127/78 (08/17 0733) SpO2:  [99 %-100 %] 100 % (08/17 0733) Last BM Date : 04/18/24  Intake/Output from previous day: 08/16 0701 - 08/17 0700 In: 694.1 [I.V.:604.1; NG/GT:90] Out: 900 [Emesis/NG output:900] Intake/Output this shift: No intake/output data recorded.  Physical Exam: HEENT - sclerae clear, mucous membranes moist Abdomen - soft, minimal distension; non-tender  Lab Results:  Recent Labs    04/22/24 0743 04/23/24 0736  WBC 5.6 4.6  HGB 10.1* 10.2*  HCT 30.6* 30.5*  PLT 138* 137*   BMET Recent Labs    04/21/24 0701 04/22/24 0743  NA 144 141  K 3.8 3.7  CL 111 110  CO2 24 26  GLUCOSE 135* 121*  BUN 15 12  CREATININE 1.07* 1.05*  CALCIUM  8.9 8.7*   PT/INR No results for input(s): LABPROT, INR in the last 72 hours. Comprehensive Metabolic Panel:    Component Value Date/Time   NA 141 04/22/2024 0743   NA 144 04/21/2024 0701   K 3.7 04/22/2024 0743   K 3.8 04/21/2024 0701   CL 110 04/22/2024 0743   CL 111 04/21/2024 0701   CO2 26 04/22/2024 0743   CO2 24 04/21/2024 0701   BUN 12 04/22/2024 0743   BUN 15 04/21/2024 0701   CREATININE 1.05 (H) 04/22/2024 0743   CREATININE  1.07 (H) 04/21/2024 0701   CREATININE 1.39 (H) 12/14/2023 1018   CREATININE 1.20 (H) 06/29/2023 0958   GLUCOSE 121 (H) 04/22/2024 0743   GLUCOSE 135 (H) 04/21/2024 0701   CALCIUM  8.7 (L) 04/22/2024 0743   CALCIUM  8.9 04/21/2024 0701   AST 19 04/19/2024 2008   AST 21 02/17/2024 1254   AST 23 12/14/2023 1018   AST 18 06/29/2023 0958   ALT 13 04/19/2024 2008   ALT 16 02/17/2024 1254   ALT 21 12/14/2023 1018   ALT 13 06/29/2023 0958   ALKPHOS 81 04/19/2024 2008   ALKPHOS 88 02/17/2024 1254   BILITOT 0.7 04/19/2024 2008   BILITOT 0.7 02/17/2024 1254   BILITOT 0.4 12/14/2023 1018   BILITOT 0.4 06/29/2023 0958   PROT 7.3 04/19/2024 2008   PROT 6.7 02/17/2024 1254   ALBUMIN 4.0 04/19/2024 2008   ALBUMIN 4.2 02/17/2024 1254    Studies/Results: DG Abd Portable 1V Result Date: 04/22/2024 CLINICAL DATA:  Small-bowel obstruction EXAM: DG ABD PORTABLE 1V COMPARISON:  A 1425 FINDINGS: 2 supine frontal views of the abdomen and pelvis demonstrate enteric catheter tip projecting over the gastric antrum. No evidence of small bowel distension. Oral contrast has progressed throughout the colon by the time of imaging. No masses or abnormal calcifications. Extensive postsurgical  changes of the lumbosacral spine and left hip again noted. IMPRESSION: 1. No evidence of small-bowel obstruction. Progression of oral contrast throughout the colon. Electronically Signed   By: Ozell Daring M.D.   On: 04/22/2024 10:13      [name] 04/23/2024  Patient ID: Nicole Morris, female   DOB: 07-29-1961, 63 y.o.   MRN: [id number]

## 2024-04-24 DIAGNOSIS — K56609 Unspecified intestinal obstruction, unspecified as to partial versus complete obstruction: Secondary | ICD-10-CM | POA: Diagnosis not present

## 2024-04-24 LAB — CBC
HCT: 31.3 % — ABNORMAL LOW (ref 36.0–46.0)
Hemoglobin: 10.6 g/dL — ABNORMAL LOW (ref 12.0–15.0)
MCH: 31 pg (ref 26.0–34.0)
MCHC: 33.9 g/dL (ref 30.0–36.0)
MCV: 91.5 fL (ref 80.0–100.0)
Platelets: 143 K/uL — ABNORMAL LOW (ref 150–400)
RBC: 3.42 MIL/uL — ABNORMAL LOW (ref 3.87–5.11)
RDW: 12.3 % (ref 11.5–15.5)
WBC: 5 K/uL (ref 4.0–10.5)
nRBC: 0 % (ref 0.0–0.2)

## 2024-04-24 LAB — BASIC METABOLIC PANEL WITH GFR
Anion gap: 8 (ref 5–15)
BUN: 11 mg/dL (ref 8–23)
CO2: 27 mmol/L (ref 22–32)
Calcium: 8.8 mg/dL — ABNORMAL LOW (ref 8.9–10.3)
Chloride: 105 mmol/L (ref 98–111)
Creatinine, Ser: 1.07 mg/dL — ABNORMAL HIGH (ref 0.44–1.00)
GFR, Estimated: 58 mL/min — ABNORMAL LOW (ref >60)
Glucose, Bld: 102 mg/dL — ABNORMAL HIGH (ref 70–99)
Potassium: 4 mmol/L (ref 3.5–5.1)
Sodium: 140 mmol/L (ref 135–145)

## 2024-04-24 LAB — MAGNESIUM: Magnesium: 1.8 mg/dL (ref 1.7–2.4)

## 2024-04-24 MED ORDER — BUPROPION HCL ER (XL) 150 MG PO TB24
300.0000 mg | ORAL_TABLET | Freq: Every day | ORAL | Status: DC
  Administered 2024-04-24 – 2024-04-25 (×2): 300 mg via ORAL
  Filled 2024-04-24 (×2): qty 2

## 2024-04-24 MED ORDER — LEVOTHYROXINE SODIUM 50 MCG PO TABS
50.0000 ug | ORAL_TABLET | Freq: Every morning | ORAL | Status: DC
  Administered 2024-04-24 – 2024-04-25 (×2): 50 ug via ORAL
  Filled 2024-04-24 (×2): qty 1

## 2024-04-24 MED ORDER — LORAZEPAM 0.5 MG PO TABS: 0.2500 mg | ORAL_TABLET | Freq: Three times a day (TID) | ORAL | Status: DC | PRN

## 2024-04-24 MED ORDER — ROSUVASTATIN CALCIUM 5 MG PO TABS
5.0000 mg | ORAL_TABLET | Freq: Every day | ORAL | Status: DC
  Administered 2024-04-24 – 2024-04-25 (×2): 5 mg via ORAL
  Filled 2024-04-24 (×2): qty 1

## 2024-04-24 MED ORDER — POLYETHYLENE GLYCOL 3350 17 G PO PACK
17.0000 g | PACK | Freq: Every day | ORAL | Status: DC
  Administered 2024-04-24: 17 g via ORAL
  Filled 2024-04-24 (×2): qty 1

## 2024-04-24 MED ORDER — BUTALBITAL-APAP-CAFFEINE 50-325-40 MG PO TABS
1.0000 | ORAL_TABLET | Freq: Three times a day (TID) | ORAL | Status: DC | PRN
  Administered 2024-04-25: 1 via ORAL
  Filled 2024-04-24: qty 1

## 2024-04-24 MED ORDER — DICLOFENAC SODIUM 1 % EX GEL: 2.0000 g | Freq: Every day | CUTANEOUS | Status: DC | PRN

## 2024-04-24 MED ORDER — DOCUSATE SODIUM 100 MG PO CAPS
100.0000 mg | ORAL_CAPSULE | Freq: Two times a day (BID) | ORAL | Status: DC
  Administered 2024-04-24: 100 mg via ORAL
  Filled 2024-04-24 (×3): qty 1

## 2024-04-24 MED ORDER — SERTRALINE HCL 100 MG PO TABS
100.0000 mg | ORAL_TABLET | Freq: Every evening | ORAL | Status: DC
  Administered 2024-04-24: 100 mg via ORAL
  Filled 2024-04-24: qty 1

## 2024-04-24 NOTE — Plan of Care (Signed)
  Problem: Elimination: Goal: Will not experience complications related to bowel motility Outcome: Progressing   Tolerating liquids fair, had BM today.   Problem: Activity: Goal: Risk for activity intolerance will decrease Outcome: Progressing  Ambulating with fast paced, steady gait in hall, accompanied by husband.

## 2024-04-24 NOTE — Progress Notes (Signed)
 Subjective: CC: Reports RLQ abdominal pain, bloating/distension, and nausea have improved since admission but are still present. Does report some chronic RLQ abdominal pain but this feels different. Currently rated as a 3/10. No IV PRN pain medications in the last 24 hours. Tolerating FLD without worsening of abdominal pain, nausea, or distension. Denies vomiting. She is passing flatus. No BM. Mobilizing.   Objective: Vital signs in last 24 hours: Temp:  [98.5 F (36.9 C)-99.4 F (37.4 C)] 98.5 F (36.9 C) (08/18 0500) Pulse Rate:  [61-69] 61 (08/18 0500) Resp:  [16-19] 18 (08/18 0500) BP: (122-132)/(79-85) 130/85 (08/18 0500) SpO2:  [96 %-100 %] 96 % (08/18 0500) Last BM Date : 04/18/24  Intake/Output from previous day: 08/17 0701 - 08/18 0700 In: 0  Out: 700 [Urine:700] Intake/Output this shift: No intake/output data recorded.  PE: Gen:  Alert, NAD, pleasant Abd: Soft, mild distension, RLQ ttp without rigidity or guarding.   Lab Results:  Recent Labs    04/23/24 0736 04/24/24 0442  WBC 4.6 5.0  HGB 10.2* 10.6*  HCT 30.5* 31.3*  PLT 137* 143*   BMET Recent Labs    04/23/24 0736 04/24/24 0442  NA 142 140  K 3.7 4.0  CL 108 105  CO2 27 27  GLUCOSE 114* 102*  BUN 12 11  CREATININE 1.08* 1.07*  CALCIUM  8.7* 8.8*   PT/INR No results for input(s): LABPROT, INR in the last 72 hours. CMP     Component Value Date/Time   NA 140 04/24/2024 0442   K 4.0 04/24/2024 0442   CL 105 04/24/2024 0442   CO2 27 04/24/2024 0442   GLUCOSE 102 (H) 04/24/2024 0442   BUN 11 04/24/2024 0442   CREATININE 1.07 (H) 04/24/2024 0442   CREATININE 1.39 (H) 12/14/2023 1018   CALCIUM  8.8 (L) 04/24/2024 0442   PROT 7.3 04/19/2024 2008   ALBUMIN 4.0 04/19/2024 2008   AST 19 04/19/2024 2008   AST 23 12/14/2023 1018   ALT 13 04/19/2024 2008   ALT 21 12/14/2023 1018   ALKPHOS 81 04/19/2024 2008   BILITOT 0.7 04/19/2024 2008   BILITOT 0.4 12/14/2023 1018   GFRNONAA 58  (L) 04/24/2024 0442   GFRNONAA 43 (L) 12/14/2023 1018   GFRAA 57 (L) 01/15/2020 2002   Lipase     Component Value Date/Time   LIPASE 36 04/19/2024 2008    Studies/Results: DG Abd Portable 1V Result Date: 04/23/2024 CLINICAL DATA:  Small-bowel obstruction EXAM: PORTABLE ABDOMEN - 1 VIEW COMPARISON:  04/22/2024 FINDINGS: Two supine frontal views of the abdomen and pelvis are obtained. Enteric catheter tip and side port project over the gastric body and antrum. Oral contrast is seen throughout the colon. No evidence of bowel obstruction or ileus. No masses or abnormal calcifications. Extensive postsurgical changes of the thoracolumbar spine and pelvis. IMPRESSION: 1. Enteric catheter as above. 2. No evidence of small-bowel obstruction. Oral contrast within the fecal stream of the colon. Electronically Signed   By: Ozell Daring M.D.   On: 04/23/2024 13:01    Anti-infectives: Anti-infectives (From admission, onward)    None        Assessment/Plan SBO - CT 8/13 w/ mildly dilated small bowel loops in the RUQ with mucosal fold thickening and wall thickening and transition in the mid/distal ileum near prior anastomosis. No free air or free fluid - History of RIH repair when she was 63 years old, a bladder augmentation surgery at [facility] in 1988 that involved a SBR,  abdominal hysterectomy, and anterior approach spine surgery in 2008.  - Failed clamping trial 8/16 - Xray 8/17 w/ contrast in colon and small bowel dilation improved - NGT now out and on FLD. Still with some nausea, pain and distension but taking in FLD and passing flatus. Will leave on FLD today.  - HDS without fever, tachycardia or hypotension on last set of vitals. No peritonitis on exam. WBC wnl. No current indication for emergency surgery - If any worsening abdominal pain, distension or nausea I have asked RN to let me know - We will follow with you.   FEN - FLD, IVF per primary. Keep K >=4, Phos >= 3, Mg >= 2  VTE - SCDs,  okay for chem ppx from a general surgery standpoint ID - None Foley - Reports self caths at home   CKD3B Hypothyroidism HLD  I reviewed nursing notes, hospitalist notes, last 24 h vitals and pain scores, last 48 h intake and output, last 24 h labs and trends, and last 24 h imaging results.    LOS: 4 days    Ozell CHRISTELLA Shaper, [facility] 04/24/2024, 8:19 AM Please see Amion for pager number during day hours 7:00am-4:30pm

## 2024-04-24 NOTE — Plan of Care (Signed)
  Problem: Pain Managment: Goal: General experience of comfort will improve and/or be controlled Outcome: Progressing   Problem: Safety: Goal: Ability to remain free from injury will improve Outcome: Progressing

## 2024-04-24 NOTE — Progress Notes (Signed)
 PROGRESS NOTE    Nicole Morris  FMW:[id number] DOB: [birth year] DOA: 04/19/2024 PCP: Teresa Channel, MD    Brief Narrative:   63 year old with history of abdominal surgery, SBO, asthma, chronic osteoarthritis, GAD, CKD 3B, hypothyroidism, L2 iliac wing instrumentation and fusion with displaced left iliac screw head requiring repair December thousand 24 presented to the ED with abdominal pain.  Patient found to have partial SBO likely secondary to radiation.  SBO protocol initiated, general surgery was consulted.  Assessment & Plan:  Principal Problem:   SBO (small bowel obstruction) (HCC) Active Problems:   CKD (chronic kidney disease) stage 3, GFR 30-59 ml/min (HCC)   Hypothyroidism   History of back surgery   History of asthma   GAD (generalized anxiety disorder)   Partial small bowel obstruction History of SBO abdominal surgery Small bowel protocol initiated..  General surgery team -treated with NG tube.  Abdominal x-ray appears to show some improvement.  Diet per general surgery.   AKI on CKD stage IIIb; improved.  - Baseline creatinine 1.0, admission creatinine 1.35, now resolved   Hypothyroidism -As patient is n.p.o. holding oral levothyroxine .   History of iliac crest surgery   History of asthma -Stable.     Generalized anxiety disorder -ResumePO meds     Hyperlipidemia -resume Crestor .     DVT prophylaxis: SCDs    Code Status: Full Code Family Communication:   Status is: Inpatient Remains inpatient appropriate because: Ongoing management for SBO   PT Follow up Recs:   Subjective: Feels okay no complaints Agreeable to try full liquid diet.   Examination:  General exam: Appears calm and comfortable, NG tube in place Respiratory system: Clear to auscultation. Respiratory effort normal. Cardiovascular system: S1 & S2 heard, RRR. No JVD, murmurs, rubs, gallops or clicks. No pedal edema. Gastrointestinal system: Abdomen is nondistended, diminished  BS Central nervous system: Alert and oriented. No focal neurological deficits. Extremities: Symmetric 5 x 5 power. Skin: No rashes, lesions or ulcers Psychiatry: Judgement and insight appear normal. Mood & affect appropriate.                Diet Orders (From admission, onward)     Start     Ordered   04/23/24 1348  Diet full liquid Room service appropriate? Yes; Fluid consistency: Thin  Diet effective now       Question Answer Comment  Room service appropriate? Yes   Fluid consistency: Thin      04/23/24 1347            Objective: Vitals:   04/23/24 1659 04/23/24 1926 04/24/24 0500 04/24/24 0943  BP: 132/83 122/79 130/85 135/87  Pulse: 67 69 61 (!) 59  Resp: 19 16 18 17   Temp: 99.4 F (37.4 C) 98.7 F (37.1 C) 98.5 F (36.9 C) (!) 97.4 F (36.3 C)  TempSrc: Oral Oral Oral   SpO2: 100% 100% 96% 100%  Weight:      Height:       No intake or output data in the 24 hours ending 04/24/24 1215 Filed Weights   04/19/24 1952  Weight: 59 kg    Scheduled Meds:  buPROPion   300 mg Oral Daily   docusate sodium   100 mg Oral BID   levothyroxine   50 mcg Oral q morning   polyethylene glycol  17 g Oral Daily   rosuvastatin   5 mg Oral Daily   sertraline   100 mg Oral QPM   Continuous Infusions:  dextrose  5 % and 0.45 % NaCl  Stopped (04/23/24 1556)    Nutritional status     Body mass index is 23.78 kg/m.  Data Reviewed:   CBC: Recent Labs  Lab 04/20/24 0550 04/21/24 0701 04/22/24 0743 04/23/24 0736 04/24/24 0442  WBC 5.5 5.2 5.6 4.6 5.0  HGB 12.9 10.6* 10.1* 10.2* 10.6*  HCT 39.2 32.0* 30.6* 30.5* 31.3*  MCV 93.8 94.7 93.6 93.8 91.5  PLT 198 148* 138* 137* 143*   Basic Metabolic Panel: Recent Labs  Lab 04/20/24 0550 04/21/24 0701 04/22/24 0743 04/23/24 0736 04/24/24 0442  NA 140 144 141 142 140  K 3.5 3.8 3.7 3.7 4.0  CL 110 111 110 108 105  CO2 21* 24 26 27 27   GLUCOSE 109* 135* 121* 114* 102*  BUN 20 15 12 12 11   CREATININE 1.35*  1.07* 1.05* 1.08* 1.07*  CALCIUM  9.3 8.9 8.7* 8.7* 8.8*  MG  --  1.8 1.7 1.8 1.8  PHOS  --  2.8  --   --   --    GFR: Estimated Creatinine Clearance: 42.6 mL/min (A) (by C-G formula based on SCr of 1.07 mg/dL (H)). Liver Function Tests: Recent Labs  Lab 04/19/24 2008  AST 19  ALT 13  ALKPHOS 81  BILITOT 0.7  PROT 7.3  ALBUMIN 4.0   Recent Labs  Lab 04/19/24 2008  LIPASE 36   No results for input(s): AMMONIA in the last 168 hours. Coagulation Profile: No results for input(s): INR, PROTIME in the last 168 hours. Cardiac Enzymes: No results for input(s): CKTOTAL, CKMB, CKMBINDEX, TROPONINI in the last 168 hours. BNP (last 3 results) No results for input(s): PROBNP in the last 8760 hours. HbA1C: No results for input(s): HGBA1C in the last 72 hours. CBG: No results for input(s): GLUCAP in the last 168 hours. Lipid Profile: No results for input(s): CHOL, HDL, LDLCALC, TRIG, CHOLHDL, LDLDIRECT in the last 72 hours. Thyroid  Function Tests: No results for input(s): TSH, T4TOTAL, FREET4, T3FREE, THYROIDAB in the last 72 hours. Anemia Panel: No results for input(s): VITAMINB12, FOLATE, FERRITIN, TIBC, IRON, RETICCTPCT in the last 72 hours. Sepsis Labs: No results for input(s): PROCALCITON, LATICACIDVEN in the last 168 hours.  No results found for this or any previous visit (from the past 240 hours).       Radiology Studies: DG Abd Portable 1V Result Date: 04/23/2024 CLINICAL DATA:  Small-bowel obstruction EXAM: PORTABLE ABDOMEN - 1 VIEW COMPARISON:  04/22/2024 FINDINGS: Two supine frontal views of the abdomen and pelvis are obtained. Enteric catheter tip and side port project over the gastric body and antrum. Oral contrast is seen throughout the colon. No evidence of bowel obstruction or ileus. No masses or abnormal calcifications. Extensive postsurgical changes of the thoracolumbar spine and pelvis. IMPRESSION: 1.  Enteric catheter as above. 2. No evidence of small-bowel obstruction. Oral contrast within the fecal stream of the colon. Electronically Signed   By: Ozell Daring M.D.   On: 04/23/2024 13:01           LOS: 4 days   Time spent= 35 mins    Jullianna Gabor JAYSON Dare, MD [facility]  If 7PM-7AM, please contact night-coverage  04/24/2024, 12:15 PM

## 2024-04-25 ENCOUNTER — Other Ambulatory Visit (HOSPITAL_COMMUNITY): Payer: Self-pay

## 2024-04-25 ENCOUNTER — Encounter: Payer: Self-pay | Admitting: Physician Assistant

## 2024-04-25 DIAGNOSIS — K56609 Unspecified intestinal obstruction, unspecified as to partial versus complete obstruction: Secondary | ICD-10-CM | POA: Diagnosis not present

## 2024-04-25 LAB — BASIC METABOLIC PANEL WITH GFR
Anion gap: 8 (ref 5–15)
BUN: 11 mg/dL (ref 8–23)
CO2: 25 mmol/L (ref 22–32)
Calcium: 8.9 mg/dL (ref 8.9–10.3)
Chloride: 107 mmol/L (ref 98–111)
Creatinine, Ser: 1.01 mg/dL — ABNORMAL HIGH (ref 0.44–1.00)
GFR, Estimated: >60 mL/min (ref >60)
Glucose, Bld: 89 mg/dL (ref 70–99)
Potassium: 3.5 mmol/L (ref 3.5–5.1)
Sodium: 140 mmol/L (ref 135–145)

## 2024-04-25 LAB — CBC
HCT: 31.3 % — ABNORMAL LOW (ref 36.0–46.0)
Hemoglobin: 10.7 g/dL — ABNORMAL LOW (ref 12.0–15.0)
MCH: 30.9 pg (ref 26.0–34.0)
MCHC: 34.2 g/dL (ref 30.0–36.0)
MCV: 90.5 fL (ref 80.0–100.0)
Platelets: 152 K/uL (ref 150–400)
RBC: 3.46 MIL/uL — ABNORMAL LOW (ref 3.87–5.11)
RDW: 12.4 % (ref 11.5–15.5)
WBC: 3.5 K/uL — ABNORMAL LOW (ref 4.0–10.5)
nRBC: 0 % (ref 0.0–0.2)

## 2024-04-25 LAB — MAGNESIUM: Magnesium: 2 mg/dL (ref 1.7–2.4)

## 2024-04-25 MED ORDER — POTASSIUM CHLORIDE 20 MEQ PO PACK
40.0000 meq | PACK | Freq: Once | ORAL | Status: AC
  Administered 2024-04-25: 40 meq via ORAL
  Filled 2024-04-25: qty 2

## 2024-04-25 MED ORDER — DOCUSATE SODIUM 100 MG PO CAPS
100.0000 mg | ORAL_CAPSULE | Freq: Two times a day (BID) | ORAL | 0 refills | Status: AC | PRN
Start: 2024-04-25 — End: ?
  Filled 2024-04-25: qty 30, 15d supply, fill #0

## 2024-04-25 MED ORDER — POLYETHYLENE GLYCOL 3350 17 GM/SCOOP PO POWD
17.0000 g | Freq: Every day | ORAL | 0 refills | Status: AC | PRN
  Filled 2024-04-25: qty 238, 14d supply, fill #0

## 2024-04-25 MED ORDER — ONDANSETRON 4 MG PO TBDP
4.0000 mg | ORAL_TABLET | Freq: Three times a day (TID) | ORAL | 0 refills | Status: AC | PRN
Start: 2024-04-25 — End: ?
  Filled 2024-04-25: qty 15, 5d supply, fill #0

## 2024-04-25 NOTE — Plan of Care (Signed)

## 2024-04-25 NOTE — Discharge Summary (Signed)
 Physician Discharge Summary  Nicole Morris FMW:[id number] DOB: Jul 22, 1961 DOA: 04/19/2024  PCP: Teresa Channel, MD  Admit date: 04/19/2024 Discharge date: 04/25/2024  Admitted From: Home Disposition: Home  Recommendations for Outpatient Follow-up:  Follow up with PCP in 1-2 weeks Please obtain BMP/CBC in one week your next doctors visit.     Discharge Condition: Stable CODE STATUS: Full code Diet recommendation: Soft low residue  Brief/Interim Summary: Brief Narrative:   63 year old with history of abdominal surgery, SBO, asthma, chronic osteoarthritis, GAD, CKD 3B, hypothyroidism, L2 iliac wing instrumentation and fusion with displaced left iliac screw head requiring repair December thousand 24 presented to the ED with abdominal pain.  Patient found to have partial SBO likely secondary to radiation.  SBO protocol initiated, general surgery was consulted.  Patient slowly improved with conservative management.  Cleared for discharge.  Assessment & Plan:  Principal Problem:   SBO (small bowel obstruction) (HCC) Active Problems:   CKD (chronic kidney disease) stage 3, GFR 30-59 ml/min (HCC)   Hypothyroidism   History of back surgery   History of asthma   GAD (generalized anxiety disorder)   Partial small bowel obstruction History of SBO abdominal surgery Small bowel protocol initiated..  General surgery team -treated with NG tube.  Abdominal x-ray appears to show some improvement.  Tolerating p.o., cleared by general surgery   AKI on CKD stage IIIb; improved.  - Baseline creatinine 1.0, admission creatinine 1.35, now resolved   Hypothyroidism -As patient is n.p.o. holding oral levothyroxine .   History of iliac crest surgery   History of asthma -Stable.     Generalized anxiety disorder -ResumePO meds     Hyperlipidemia -resume Crestor .     DVT prophylaxis: SCDs    Code Status: Full Code Family Communication:   Status is: Inpatient Remains inpatient  appropriate because:DC   PT Follow up Recs:   Subjective: Tolerating soft diet this morning Spouse at bedside Wishing to go home  Examination:  General exam: Appears calm and comfortable, NG tube in place Respiratory system: Clear to auscultation. Respiratory effort normal. Cardiovascular system: S1 & S2 heard, RRR. No JVD, murmurs, rubs, gallops or clicks. No pedal edema. Gastrointestinal system: Abdomen is nondistended, diminished BS Central nervous system: Alert and oriented. No focal neurological deficits. Extremities: Symmetric 5 x 5 power. Skin: No rashes, lesions or ulcers Psychiatry: Judgement and insight appear normal. Mood & affect appropriate.    Discharge Diagnoses:  Principal Problem:   SBO (small bowel obstruction) (HCC) Active Problems:   CKD (chronic kidney disease) stage 3, GFR 30-59 ml/min (HCC)   Hypothyroidism   History of back surgery   History of asthma   GAD (generalized anxiety disorder)      Discharge Exam: Vitals:   04/25/24 0413 04/25/24 0924  BP: 132/82 (!) 142/83  Pulse: 70 65  Resp: 16 17  Temp: 98.1 F (36.7 C) 98.1 F (36.7 C)  SpO2: 96% 100%   Vitals:   04/24/24 1735 04/24/24 1940 04/25/24 0413 04/25/24 0924  BP: 120/83 112/71 132/82 (!) 142/83  Pulse: 61 74 70 65  Resp:  18 16 17   Temp: 97.9 F (36.6 C) 98.2 F (36.8 C) 98.1 F (36.7 C) 98.1 F (36.7 C)  TempSrc: Oral Oral Oral Oral  SpO2: 100% 96% 96% 100%  Weight:      Height:          Discharge Instructions   Allergies as of 04/25/2024       Reactions   Atorvastatin  Other reaction(s): joint pain   Meperidine     Other Other (See Comments)   Darvocet N doesn't like the way it makes her feel   Quinolones Other (See Comments)   Ascending thoracic aortic aneurysm, use with caution   Codeine Nausea Only   Metoclopramide     tremors   Mushroom Extract Complex (obsolete) Nausea And Vomiting   Projectile vomiting        Medication List     STOP  taking these medications    oxyCODONE  5 MG immediate release tablet Commonly known as: Oxy IR/ROXICODONE        TAKE these medications    albuterol  108 (90 Base) MCG/ACT inhaler Commonly known as: VENTOLIN  HFA Inhale 2 puffs into the lungs every 6 (six) hours as needed for wheezing or shortness of breath.   B-12 1000 MCG Subl Place 1,000 mcg under the tongue daily.   buPROPion  300 MG 24 hr tablet Commonly known as: WELLBUTRIN  XL Take 300 mg by mouth daily.   butalbital -acetaminophen -caffeine  50-325-40 MG tablet Commonly known as: FIORICET  Take 1 tablet by mouth 3 (three) times daily as needed for headache or migraine.   diclofenac  sodium 1 % Gel Commonly known as: VOLTAREN  Apply 2 g topically daily as needed (joint pain).   docusate sodium  100 MG capsule Commonly known as: COLACE Take 1 capsule (100 mg total) by mouth 2 (two) times daily as needed for mild constipation.   levothyroxine  50 MCG tablet Commonly known as: SYNTHROID  Take 50 mcg by mouth every morning.   LORazepam  0.5 MG tablet Commonly known as: ATIVAN  Take 0.25-0.5 mg by mouth every 8 (eight) hours as needed for anxiety.   Nurtec 75 MG Tbdp Generic drug: Rimegepant Sulfate Take 1 tablet (75 mg total) by mouth every other day.   ondansetron  4 MG disintegrating tablet Commonly known as: ZOFRAN -ODT Take 1 tablet (4 mg total) by mouth every 8 (eight) hours as needed for nausea or vomiting.   polyethylene glycol powder 17 GM/SCOOP powder Commonly known as: GLYCOLAX /MIRALAX  Place 1 capful (17 g) in liquid as directed, and take by mouth daily as needed.   rosuvastatin  5 MG tablet Commonly known as: CRESTOR  Take 5 mg by mouth daily.   sertraline  100 MG tablet Commonly known as: ZOLOFT  Take 100 mg by mouth every evening.   tiZANidine  2 MG tablet Commonly known as: ZANAFLEX  Take 1 tablet (2 mg total) by mouth every 8 (eight) hours as needed for muscle spasms.        Follow-up Information      Teresa Channel, MD Follow up in 1 week(s).   Specialty: Family Medicine Contact information: [address] [place] [address] [phone]                Allergies  Allergen Reactions   Atorvastatin     Other reaction(s): joint pain   Meperidine     Other Other (See Comments)    Darvocet N doesn't like the way it makes her feel   Quinolones Other (See Comments)    Ascending thoracic aortic aneurysm, use with caution   Codeine Nausea Only   Metoclopramide      tremors   Mushroom Extract Complex (Obsolete) Nausea And Vomiting    Projectile vomiting    You were cared for by a hospitalist during your hospital stay. If you have any questions about your discharge medications or the care you received while you were in the hospital after you are discharged, you can call the unit  and asked to speak with the hospitalist on call if the hospitalist that took care of you is not available. Once you are discharged, your primary care physician will handle any further medical issues. Please note that no refills for any discharge medications will be authorized once you are discharged, as it is imperative that you return to your primary care physician (or establish a relationship with a primary care physician if you do not have one) for your aftercare needs so that they can reassess your need for medications and monitor your lab values.  You were cared for by a hospitalist during your hospital stay. If you have any questions about your discharge medications or the care you received while you were in the hospital after you are discharged, you can call the unit and asked to speak with the hospitalist on call if the hospitalist that took care of you is not available. Once you are discharged, your primary care physician will handle any further medical issues. Please note that NO REFILLS for any discharge medications will be authorized once you are discharged, as it is imperative that you  return to your primary care physician (or establish a relationship with a primary care physician if you do not have one) for your aftercare needs so that they can reassess your need for medications and monitor your lab values.  Please request your Prim.MD to go over all Hospital Tests and Procedure/Radiological results at the follow up, please get all Hospital records sent to your Prim MD by signing hospital release before you go home.  Get CBC, CMP, 2 view Chest X ray checked  by Primary MD during your next visit or SNF MD in 5-7 days ( we routinely change or add medications that can affect your baseline labs and fluid status, therefore we recommend that you get the mentioned basic workup next visit with your PCP, your PCP may decide not to get them or add new tests based on their clinical decision)  On your next visit with your primary care physician please Get Medicines reviewed and adjusted.  If you experience worsening of your admission symptoms, develop shortness of breath, life threatening emergency, suicidal or homicidal thoughts you must seek medical attention immediately by calling 911 or calling your MD immediately  if symptoms less severe.  You Must read complete instructions/literature along with all the possible adverse reactions/side effects for all the Medicines you take and that have been prescribed to you. Take any new Medicines after you have completely understood and accpet all the possible adverse reactions/side effects.   Do not drive, operate heavy machinery, perform activities at heights, swimming or participation in water  activities or provide baby sitting services if your were admitted for syncope or siezures until you have seen by Primary MD or a Neurologist and advised to do so again.  Do not drive when taking Pain medications.   Procedures/Studies: DG Abd Portable 1V Result Date: 04/23/2024 CLINICAL DATA:  Small-bowel obstruction EXAM: PORTABLE ABDOMEN - 1 VIEW  COMPARISON:  04/22/2024 FINDINGS: Two supine frontal views of the abdomen and pelvis are obtained. Enteric catheter tip and side port project over the gastric body and antrum. Oral contrast is seen throughout the colon. No evidence of bowel obstruction or ileus. No masses or abnormal calcifications. Extensive postsurgical changes of the thoracolumbar spine and pelvis. IMPRESSION: 1. Enteric catheter as above. 2. No evidence of small-bowel obstruction. Oral contrast within the fecal stream of the colon. Electronically Signed   By: Ozell  Delores M.D.   On: 04/23/2024 13:01   DG Abd Portable 1V Result Date: 04/22/2024 CLINICAL DATA:  Small-bowel obstruction EXAM: DG ABD PORTABLE 1V COMPARISON:  A 1425 FINDINGS: 2 supine frontal views of the abdomen and pelvis demonstrate enteric catheter tip projecting over the gastric antrum. No evidence of small bowel distension. Oral contrast has progressed throughout the colon by the time of imaging. No masses or abnormal calcifications. Extensive postsurgical changes of the lumbosacral spine and left hip again noted. IMPRESSION: 1. No evidence of small-bowel obstruction. Progression of oral contrast throughout the colon. Electronically Signed   By: Ozell Delores M.D.   On: 04/22/2024 10:13   DG Abd Portable 1V-Small Bowel Obstruction Protocol-initial, 8 hr delay Result Date: 04/20/2024 CLINICAL DATA:  Small-bowel obstruction EXAM: PORTABLE ABDOMEN - 1 VIEW COMPARISON:  CT from the previous day. FINDINGS: Postsurgical changes are again noted and stable. Administered contrast now lies primarily within the colon consistent with a partial small bowel obstruction. No significant small bowel dilatation is seen. Contrast is noted within the bladder. Gastric catheter is seen in the stomach. IMPRESSION: Contrast material lies within the colon consistent with a partial small bowel obstruction. The degree of small-bowel dilatation has improved. Electronically Signed   By: Oneil Devonshire M.D.   On: 04/20/2024 23:37   DG Abd Portable 1V-Small Bowel Protocol-Position Verification Result Date: 04/20/2024 CLINICAL DATA:  63 year old female NG tube placement. EXAM: PORTABLE ABDOMEN - 1 VIEW COMPARISON:  CT Abdomen and Pelvis yesterday. FINDINGS: Portable AP semi upright view at 0621 hours. Enteric tube courses through the central mediastinum, terminates in the left abdomen with side hole at the level of the gastric body. Stable visible lung ventilation. Stable bowel gas pattern compared to the CT scout view yesterday. Scoliosis, extensive posterior and lower lumbar interbody spinal fusion hardware. Left chest loop recorder or superficial ICD redemonstrated. IMPRESSION: Satisfactory enteric tube placement into the stomach. Electronically Signed   By: VEAR Hurst M.D.   On: 04/20/2024 07:08   CT ABDOMEN PELVIS W CONTRAST Result Date: 04/19/2024 CLINICAL DATA:  Acute abdominal pain. EXAM: CT ABDOMEN AND PELVIS WITH CONTRAST TECHNIQUE: Multidetector CT imaging of the abdomen and pelvis was performed using the standard protocol following bolus administration of intravenous contrast. RADIATION DOSE REDUCTION: This exam was performed according to the departmental dose-optimization program which includes automated exposure control, adjustment of the mA and/or kV according to patient size and/or use of iterative reconstruction technique. CONTRAST:  75mL OMNIPAQUE  IOHEXOL  350 MG/ML SOLN COMPARISON:  02/17/2024 FINDINGS: Lower chest: The lung bases are clear of an acute process. No worrisome pulmonary lesions or pulmonary nodules. No pericardial effusion. Hepatobiliary: No hepatic lesions or intrahepatic biliary dilatation. Gallbladder is mildly distended but no gallbladder wall thickening, pericholecystic fluid or obvious gallstones. Normal caliber common bile duct. Pancreas: No mass, inflammation or ductal dilatation. Spleen: Normal in size without focal abnormality. Adrenals/Urinary Tract: Adrenal  glands are normal. Patient has a right pelvic kidney which demonstrates areas of renal cortical thinning and scarring. The kidney is small. The left kidney also demonstrates significant areas of renal cortical scarring but no worrisome renal lesions or hydronephrosis. The bladder is largely decompressed. No obvious abnormality. Stomach/Bowel: The stomach and duodenum are unremarkable. There are mildly dilated small bowel loops in the right upper quadrant. There is also mucosal fold thickening and wall thickening. Transition to nondilated/decompressed mid distal ileum near anastomotic bowel sutures. Findings suspicious for a early or partial small obstruction. There is moderate stool throughout the  colon and down into the rectum. No colonic abnormality. Vascular/Lymphatic: The aorta and branch vessels are patent. Stable scattered atherosclerotic calcifications. Stable 19 mm rim calcified splenic artery aneurysm. Reproductive: Surgically absent. Other: No free air or free fluid. Musculoskeletal: Thoracolumbar scoliosis with Harrington rods and extensive artifact. There is also a left total hip arthroplasty. No bone lesions or acute bony findings. IMPRESSION: 1. CT findings consistent with an early or partial small bowel obstruction. This is likely due to adhesions in the right lower abdomen. 2. No other significant abdominal/pelvic findings, mass lesions or adenopathy. 3. Stable 19 mm rim calcified splenic artery aneurysm. 4. Right pelvic kidney with areas of renal cortical thinning and scarring. 5. Aortic atherosclerosis. Aortic Atherosclerosis (ICD10-I70.0). Electronically Signed   By: MYRTIS Stammer M.D.   On: 04/19/2024 22:40   CUP PACEART REMOTE DEVICE CHECK Result Date: 03/28/2024 ILR summary report received. Battery status OK. Normal device function. No new symptom, tachy, brady, or pause episodes. No new AF episodes. Monthly summary reports and ROV/PRN AB, CVRS    The results of significant diagnostics  from this hospitalization (including imaging, microbiology, ancillary and laboratory) are listed below for reference.     Microbiology: No results found for this or any previous visit (from the past 240 hours).   Labs: BNP (last 3 results) No results for input(s): BNP in the last 8760 hours. Basic Metabolic Panel: Recent Labs  Lab 04/21/24 0701 04/22/24 0743 04/23/24 0736 04/24/24 0442 04/25/24 0658  NA 144 141 142 140 140  K 3.8 3.7 3.7 4.0 3.5  CL 111 110 108 105 107  CO2 24 26 27 27 25   GLUCOSE 135* 121* 114* 102* 89  BUN 15 12 12 11 11   CREATININE 1.07* 1.05* 1.08* 1.07* 1.01*  CALCIUM  8.9 8.7* 8.7* 8.8* 8.9  MG 1.8 1.7 1.8 1.8 2.0  PHOS 2.8  --   --   --   --    Liver Function Tests: Recent Labs  Lab 04/19/24 2008  AST 19  ALT 13  ALKPHOS 81  BILITOT 0.7  PROT 7.3  ALBUMIN 4.0   Recent Labs  Lab 04/19/24 2008  LIPASE 36   No results for input(s): AMMONIA in the last 168 hours. CBC: Recent Labs  Lab 04/21/24 0701 04/22/24 0743 04/23/24 0736 04/24/24 0442 04/25/24 0658  WBC 5.2 5.6 4.6 5.0 3.5*  HGB 10.6* 10.1* 10.2* 10.6* 10.7*  HCT 32.0* 30.6* 30.5* 31.3* 31.3*  MCV 94.7 93.6 93.8 91.5 90.5  PLT 148* 138* 137* 143* 152   Cardiac Enzymes: No results for input(s): CKTOTAL, CKMB, CKMBINDEX, TROPONINI in the last 168 hours. BNP: Invalid input(s): POCBNP CBG: No results for input(s): GLUCAP in the last 168 hours. D-Dimer No results for input(s): DDIMER in the last 72 hours. Hgb A1c No results for input(s): HGBA1C in the last 72 hours. Lipid Profile No results for input(s): CHOL, HDL, LDLCALC, TRIG, CHOLHDL, LDLDIRECT in the last 72 hours. Thyroid  function studies No results for input(s): TSH, T4TOTAL, T3FREE, THYROIDAB in the last 72 hours.  Invalid input(s): FREET3 Anemia work up No results for input(s): VITAMINB12, FOLATE, FERRITIN, TIBC, IRON, RETICCTPCT in the last 72  hours. Urinalysis    Component Value Date/Time   COLORURINE YELLOW 04/19/2024 1959   APPEARANCEUR HAZY (A) 04/19/2024 1959   LABSPEC 1.011 04/19/2024 1959   PHURINE 6.0 04/19/2024 1959   GLUCOSEU NEGATIVE 04/19/2024 1959   HGBUR NEGATIVE 04/19/2024 1959   BILIRUBINUR NEGATIVE 04/19/2024 1959   KETONESUR NEGATIVE 04/19/2024 1959  PROTEINUR 100 (A) 04/19/2024 1959   NITRITE NEGATIVE 04/19/2024 1959   LEUKOCYTESUR SMALL (A) 04/19/2024 1959   Sepsis Labs Recent Labs  Lab 04/22/24 0743 04/23/24 0736 04/24/24 0442 04/25/24 0658  WBC 5.6 4.6 5.0 3.5*   Microbiology No results found for this or any previous visit (from the past 240 hours).   Time coordinating discharge:  I have spent 35 minutes face to face with the patient and on the ward discussing the patients care, assessment, plan and disposition with other care givers. >50% of the time was devoted counseling the patient about the risks and benefits of treatment/Discharge disposition and coordinating care.   SIGNED:   Burgess JAYSON Dare, MD  [facility] 04/25/2024, 12:17 PM   If 7PM-7AM, please contact night-coverage

## 2024-04-25 NOTE — Progress Notes (Signed)
 Subjective: CC: Abdominal pain and bloating have resolved. She is tolerating FLD, passing flatus and had at least 2 BM's yesterday. She does have some nausea this morning but thinks this may be from a migraine. She has a hx of migraines and is normally on nurtec for ppx every 2d but has not been getting here. Nausea is improved with po intake such as gingerale.   Objective: Vital signs in last 24 hours: Temp:  [97.4 F (36.3 C)-98.2 F (36.8 C)] 98.1 F (36.7 C) (08/19 0413) Pulse Rate:  [59-74] 70 (08/19 0413) Resp:  [16-18] 16 (08/19 0413) BP: (112-135)/(71-87) 132/82 (08/19 0413) SpO2:  [96 %-100 %] 96 % (08/19 0413) Last BM Date : 04/24/24  Intake/Output from previous day: 08/18 0701 - 08/19 0700 In: 1738 [P.O.:1738] Out: -  Intake/Output this shift: No intake/output data recorded.  PE: Gen:  Alert, NAD, pleasant Abd: Soft, ND, NT  Lab Results:  Recent Labs    04/24/24 0442 04/25/24 0658  WBC 5.0 3.5*  HGB 10.6* 10.7*  HCT 31.3* 31.3*  PLT 143* 152   BMET Recent Labs    04/24/24 0442 04/25/24 0658  NA 140 140  K 4.0 3.5  CL 105 107  CO2 27 25  GLUCOSE 102* 89  BUN 11 11  CREATININE 1.07* 1.01*  CALCIUM  8.8* 8.9   PT/INR No results for input(s): LABPROT, INR in the last 72 hours. CMP     Component Value Date/Time   NA 140 04/25/2024 0658   K 3.5 04/25/2024 0658   CL 107 04/25/2024 0658   CO2 25 04/25/2024 0658   GLUCOSE 89 04/25/2024 0658   BUN 11 04/25/2024 0658   CREATININE 1.01 (H) 04/25/2024 0658   CREATININE 1.39 (H) 12/14/2023 1018   CALCIUM  8.9 04/25/2024 0658   PROT 7.3 04/19/2024 2008   ALBUMIN 4.0 04/19/2024 2008   AST 19 04/19/2024 2008   AST 23 12/14/2023 1018   ALT 13 04/19/2024 2008   ALT 21 12/14/2023 1018   ALKPHOS 81 04/19/2024 2008   BILITOT 0.7 04/19/2024 2008   BILITOT 0.4 12/14/2023 1018   GFRNONAA >60 04/25/2024 0658   GFRNONAA 43 (L) 12/14/2023 1018   GFRAA 57 (L) 01/15/2020 2002   Lipase      Component Value Date/Time   LIPASE 36 04/19/2024 2008    Studies/Results: DG Abd Portable 1V Result Date: 04/23/2024 CLINICAL DATA:  Small-bowel obstruction EXAM: PORTABLE ABDOMEN - 1 VIEW COMPARISON:  04/22/2024 FINDINGS: Two supine frontal views of the abdomen and pelvis are obtained. Enteric catheter tip and side port project over the gastric body and antrum. Oral contrast is seen throughout the colon. No evidence of bowel obstruction or ileus. No masses or abnormal calcifications. Extensive postsurgical changes of the thoracolumbar spine and pelvis. IMPRESSION: 1. Enteric catheter as above. 2. No evidence of small-bowel obstruction. Oral contrast within the fecal stream of the colon. Electronically Signed   By: Ozell Daring M.D.   On: 04/23/2024 13:01    Anti-infectives: Anti-infectives (From admission, onward)    None        Assessment/Plan SBO - CT 8/13 w/ mildly dilated small bowel loops in the RUQ with mucosal fold thickening and wall thickening and transition in the mid/distal ileum near prior anastomosis. No free air or free fluid - History of RIH repair when she was 63 years old, a bladder augmentation surgery at [facility] in 1988 that involved a SBR, abdominal hysterectomy, and anterior approach spine  surgery in 2008.  - No indication for emergency surgery - Appears to be clinically and radiographically resolving. Contrast in colon on xray 8/17. Tolerating FLD, having bowel function and benign exam. Will advance to soft diet. If tolerates soft diet, okay for d/c from our standpoint. Will send a message to [facility].  - We will follow with you.   FEN - Soft, IVF per primary. Keep K >=4, Phos >= 3, Mg >= 2  VTE - SCDs, okay for chem ppx from a general surgery standpoint ID - None Foley - Reports self caths at home   CKD3B Hypothyroidism HLD  I reviewed nursing notes, hospitalist notes, last 24 h vitals and pain scores, last 48 h intake and output, last 24 h labs and trends, and  last 24 h imaging results.    LOS: 5 days    Ozell CHRISTELLA Shaper, [facility] 04/25/2024, 8:35 AM Please see Amion for pager number during day hours 7:00am-4:30pm

## 2024-04-25 NOTE — Progress Notes (Signed)
 Reviewed AVS, patient expressed understanding of medications, MD follow up reviewed.   Removed IV, Site clean, dry and intact.  See LDA for information on wounds at discharge. Patient states all belongings brought to the hospital at time of admission are accounted for and packed to take home.  Picked up medications from [facility] pharmacy.   [facility] contacted to transport patient to entrance A where family member was waiting in vehicle to transport home.

## 2024-04-25 NOTE — Progress Notes (Signed)
 Carelink Summary Report / Loop Recorder

## 2024-04-27 ENCOUNTER — Ambulatory Visit: Payer: Self-pay | Admitting: Cardiovascular Disease

## 2024-04-27 ENCOUNTER — Ambulatory Visit (INDEPENDENT_AMBULATORY_CARE_PROVIDER_SITE_OTHER)

## 2024-04-27 DIAGNOSIS — R55 Syncope and collapse: Secondary | ICD-10-CM

## 2024-04-27 LAB — CUP PACEART REMOTE DEVICE CHECK
Date Time Interrogation Session: 20250820230725
Implantable Pulse Generator Implant Date: 20210823

## 2024-05-02 ENCOUNTER — Inpatient Hospital Stay

## 2024-05-04 ENCOUNTER — Encounter: Payer: Self-pay | Admitting: Neuroradiology

## 2024-05-04 ENCOUNTER — Ambulatory Visit: Admitting: Neuroradiology

## 2024-05-04 VITALS — BP 110/68 | Ht 62.0 in | Wt 126.6 lb

## 2024-05-04 DIAGNOSIS — I728 Aneurysm of other specified arteries: Secondary | ICD-10-CM

## 2024-05-04 NOTE — Progress Notes (Signed)
 I had the pleasure of meeting with Nicole Morris today for discussion of a splenic artery aneurysm.  She had a CT arteriogram of the chest 03/17/2024 for follow-up of a ascending thoracic aortic aneurysm, and the splenic aneurysm was noted on that study.  The aneurysm was measured at 2.2 x 1.6 x 2 cm.  I reviewed the study in detail.  The splenic artery is quite tortuous and the aneurysm is very close to the splenic hilum.  In fact it involves the bifurcation of the splenic artery at the level of the hilum, and one of the branches is also aneurysmal.  Assessment:  Incidentally detected 2 cm splenic artery aneurysm.  Recommendation:  I explained to the patient that there is really no good data on the risk of rupture of a 2 cm splenic artery aneurysm.  In my opinion, the risk of rupture is relatively low.  I do not think the aneurysm can be treated endovascularly without sacrifice of the artery near the hilum, which would result in a significant splenic infarction.  As such, if surgery is needed, it would likely require a splenectomy with resection of the aneurysm.  My recommendation is a follow-up CT arteriogram in 1 year.  This can be done at the same time as the follow-up for her CT thoracic arteriogram.  If there is significant growth of the aneurysm, we can rediscuss the options.

## 2024-05-29 ENCOUNTER — Ambulatory Visit: Payer: Self-pay | Admitting: Cardiovascular Disease

## 2024-05-29 ENCOUNTER — Ambulatory Visit (INDEPENDENT_AMBULATORY_CARE_PROVIDER_SITE_OTHER)

## 2024-05-29 DIAGNOSIS — R55 Syncope and collapse: Secondary | ICD-10-CM

## 2024-05-29 LAB — CUP PACEART REMOTE DEVICE CHECK
Date Time Interrogation Session: 20250921230643
Implantable Pulse Generator Implant Date: 20210823

## 2024-05-30 ENCOUNTER — Inpatient Hospital Stay: Attending: Physician Assistant

## 2024-05-30 DIAGNOSIS — E538 Deficiency of other specified B group vitamins: Secondary | ICD-10-CM | POA: Diagnosis present

## 2024-05-30 DIAGNOSIS — Z79899 Other long term (current) drug therapy: Secondary | ICD-10-CM | POA: Diagnosis not present

## 2024-05-30 MED ORDER — CYANOCOBALAMIN 1000 MCG/ML IJ SOLN
1000.0000 ug | Freq: Once | INTRAMUSCULAR | Status: AC
  Administered 2024-05-30: 1000 ug via INTRAMUSCULAR
  Filled 2024-05-30: qty 1

## 2024-05-30 NOTE — Progress Notes (Signed)
 Remote Loop Recorder Transmission

## 2024-06-09 ENCOUNTER — Encounter (HOSPITAL_BASED_OUTPATIENT_CLINIC_OR_DEPARTMENT_OTHER): Payer: Self-pay | Admitting: Student

## 2024-06-09 ENCOUNTER — Ambulatory Visit (HOSPITAL_BASED_OUTPATIENT_CLINIC_OR_DEPARTMENT_OTHER): Admitting: Student

## 2024-06-09 ENCOUNTER — Ambulatory Visit (INDEPENDENT_AMBULATORY_CARE_PROVIDER_SITE_OTHER)

## 2024-06-09 DIAGNOSIS — M25561 Pain in right knee: Secondary | ICD-10-CM

## 2024-06-09 NOTE — Progress Notes (Signed)
 Chief Complaint: Right knee pain    Discussed the use of AI scribe software for clinical note transcription with the patient, who gave verbal consent to proceed.  History of Present Illness Nicole Morris is a 63 year old female who presents with right knee pain after a recent injury.  She experiences right knee pain after feeling a 'pop' yesterday while running to assist someone having a seizure. The pain is located at the back of the knee and worsens with weight-bearing and bending. There is no clicking, locking, or buckling. She underwent a knee scope on the same knee in 1995 or 1996 for a similar injury and describes the current pain as similar. She has a history of a partial MCL tear managed non-operatively. Her left knee has been more problematic due to arthritis, but the right knee was considered her 'good knee' until this injury. She has not been taking NSAIDs due to kidney disease but took one oxycodone  with some relief. She has been icing the knee to manage swelling.   Surgical History:   Prior right knee arthroscopy  PMH/PSH/Family History/Social History/Meds/Allergies:    Past Medical History:  Diagnosis Date   Anxiety    Asthma    Chronic lower back pain    Dysrhythmia    pt got Tachy last time under anesthesia   High cholesterol    History of blood transfusion    related to surgeries (06/14/2018)   Hypothyroidism    Kidney disease, chronic, stage II (GFR 60-89 ml/min)    one of my kidneys is in my abdomen (06/14/2018)   Migraine    daily-weekly (06/14/2018)   Neuromuscular disorder (HCC)    neuropathy in legs from back surgery   PONV (postoperative nausea and vomiting)    Scoliosis    Past Surgical History:  Procedure Laterality Date   ABDOMINAL HYSTERECTOMY  1988   BACK SURGERY     BLADDER AUGMENTATION  1988   HERNIA REPAIR     JOINT REPLACEMENT     KNEE ARTHROSCOPY Right    SPINAL FUSION  2008   w/[place]  rods, etc   TOTAL HIP ARTHROPLASTY Left 06/14/2018   TOTAL HIP ARTHROPLASTY Left 06/14/2018   Procedure: LEFT TOTAL HIP ARTHROPLASTY ANTERIOR APPROACH;  Surgeon: Vernetta Lonni GRADE, MD;  Location: [facility] OR;  Service: Orthopedics;  Laterality: Left;   Social History   Socioeconomic History   Marital status: Married    Spouse name: Not on file   Number of children: 2   Years of education: college   Highest education level: Not on file  Occupational History   Occupation: quality auditor  Tobacco Use   Smoking status: Never   Smokeless tobacco: Never  Vaping Use   Vaping status: Never Used  Substance and Sexual Activity   Alcohol use: Yes    Comment: rare   Drug use: Never   Sexual activity: Not Currently  Other Topics Concern   Not on file  Social History Narrative   Lives at home with husband.   Right-handed.   2-3 cups coffee per day.   Social Drivers of Corporate investment banker Strain: Low Risk  (08/24/2023)   Received from [facility]   Overall Financial Resource Strain (CARDIA)    Difficulty of Paying Living Expenses: Not hard  at all  Food Insecurity: No Food Insecurity (04/21/2024)   Hunger Vital Sign    Worried About Running Out of Food in the Last Year: Never true    Ran Out of Food in the Last Year: Never true  Transportation Needs: No Transportation Needs (04/21/2024)   PRAPARE - Administrator, Civil Service (Medical): No    Lack of Transportation (Non-Medical): No  Physical Activity: Not on file  Stress: Not on file  Social Connections: Not on file   Family History  Problem Relation Age of Onset   Lung cancer Mother        Hx of ILD   Liver cancer Father    Allergies  Allergen Reactions   Atorvastatin     Other reaction(s): joint pain   Meperidine     Other Other (See Comments)    Darvocet N doesn't like the way it makes her feel   Quinolones Other (See Comments)    Ascending thoracic aortic aneurysm, use with  caution   Codeine Nausea Only   Metoclopramide      tremors   Mushroom Extract Complex (Obsolete) Nausea And Vomiting    Projectile vomiting   Current Outpatient Medications  Medication Sig Dispense Refill   albuterol  (PROVENTIL  HFA;VENTOLIN  HFA) 108 (90 BASE) MCG/ACT inhaler Inhale 2 puffs into the lungs every 6 (six) hours as needed for wheezing or shortness of breath. (Patient not taking: Reported on 05/04/2024)     buPROPion  (WELLBUTRIN  XL) 300 MG 24 hr tablet Take 300 mg by mouth daily.      butalbital -acetaminophen -caffeine  (FIORICET , ESGIC ) 50-325-40 MG tablet Take 1 tablet by mouth 3 (three) times daily as needed for headache or migraine.     Cyanocobalamin  (B-12) 1000 MCG SUBL Place 1,000 mcg under the tongue daily. 30 tablet 3   diclofenac  sodium (VOLTAREN ) 1 % GEL Apply 2 g topically daily as needed (joint pain).      docusate sodium  (COLACE) 100 MG capsule Take 1 capsule (100 mg total) by mouth 2 (two) times daily as needed for mild constipation. 30 capsule 0   levothyroxine  (SYNTHROID ) 50 MCG tablet Take 50 mcg by mouth every morning.     LORazepam  (ATIVAN ) 0.5 MG tablet Take 0.25-0.5 mg by mouth every 8 (eight) hours as needed for anxiety.     ondansetron  (ZOFRAN -ODT) 4 MG disintegrating tablet Take 1 tablet (4 mg total) by mouth every 8 (eight) hours as needed for nausea or vomiting. 20 tablet 0   polyethylene glycol powder (GLYCOLAX /MIRALAX ) 17 GM/SCOOP powder Place 1 capful (17 g) in liquid as directed, and take by mouth daily as needed. 238 g 0   Rimegepant Sulfate (NURTEC) 75 MG TBDP Take 1 tablet (75 mg total) by mouth every other day. 16 tablet 11   rosuvastatin  (CRESTOR ) 5 MG tablet Take 5 mg by mouth daily.      sertraline  (ZOLOFT ) 100 MG tablet Take 100 mg by mouth every evening.      tiZANidine  (ZANAFLEX ) 2 MG tablet Take 1 tablet (2 mg total) by mouth every 8 (eight) hours as needed for muscle spasms. 30 tablet 3   No current facility-administered medications for this  visit.   No results found.  Review of Systems:   A ROS was performed including pertinent positives and negatives as documented in the HPI.  Physical Exam :   Constitutional: NAD and appears stated age Neurological: Alert and oriented Psych: Appropriate affect and cooperative There were no vitals taken for this visit.  Comprehensive Musculoskeletal Exam:    Exam of the right knee demonstrates painful active range of motion from 20 to 90 degrees.  Tenderness laterally particularly over the posterior lateral joint line.  Mild effusion present.  No laxity with varus or valgus stress.  Positive McMurray.  Imaging:   Xray (right knee 4 views): Mild degenerative changes within the patellofemoral compartment with osteophyte formation.  Tibiofemoral joint spacing is well-maintained.  No evidence of acute fracture or dislocation.  Mild chondrocalcinosis noted in the lateral compartment.   I personally reviewed and interpreted the radiographs.      Assessment & Plan Right knee pain with suspected meniscus injury and mild osteoarthritis   She experiences acute right knee pain following a running incident, with tenderness and swelling localized posteriorly. X-rays reveal mild osteoarthritis without fracture.  There is concern for presence of a meniscal root tear and discussed that presence could significantly accelerate osteoarthritis.  Plan to proceed with an MRI of the right knee to evaluate for presence of soft tissue injury, particularly of the lateral meniscus.  Knee brace provided today for support and stability.  Recommend continued use of topical Voltaren  as NSAIDs are contraindicated.  Follow-up shortly after MRI for review and treatment discussion.       I personally saw and evaluated the patient, and participated in the management and treatment plan.  Leonce Reveal, PA-C Orthopedics

## 2024-06-12 NOTE — Progress Notes (Signed)
 Remote Loop Recorder Transmission

## 2024-06-13 ENCOUNTER — Ambulatory Visit
Admission: RE | Admit: 2024-06-13 | Discharge: 2024-06-13 | Disposition: A | Discharge status: Home / Self Care | Source: Ambulatory Visit | Attending: Student | Admitting: Student

## 2024-06-13 DIAGNOSIS — M25561 Pain in right knee: Secondary | ICD-10-CM

## 2024-06-23 ENCOUNTER — Ambulatory Visit (HOSPITAL_BASED_OUTPATIENT_CLINIC_OR_DEPARTMENT_OTHER): Payer: Self-pay | Admitting: Student

## 2024-06-26 ENCOUNTER — Other Ambulatory Visit: Payer: Self-pay | Admitting: Physician Assistant

## 2024-06-26 DIAGNOSIS — E538 Deficiency of other specified B group vitamins: Secondary | ICD-10-CM

## 2024-06-27 ENCOUNTER — Inpatient Hospital Stay: Attending: Physician Assistant

## 2024-06-27 ENCOUNTER — Inpatient Hospital Stay: Admitting: Physician Assistant

## 2024-06-27 ENCOUNTER — Inpatient Hospital Stay

## 2024-06-29 ENCOUNTER — Ambulatory Visit

## 2024-06-29 ENCOUNTER — Encounter

## 2024-06-29 DIAGNOSIS — R55 Syncope and collapse: Secondary | ICD-10-CM | POA: Diagnosis not present

## 2024-06-29 LAB — CUP PACEART REMOTE DEVICE CHECK
Date Time Interrogation Session: 20251022230442
Implantable Pulse Generator Implant Date: 20210823

## 2024-06-30 NOTE — Progress Notes (Signed)
 Remote Loop Recorder Transmission

## 2024-07-03 ENCOUNTER — Ambulatory Visit: Payer: Self-pay | Admitting: Cardiovascular Disease

## 2024-07-10 ENCOUNTER — Encounter: Payer: Self-pay | Admitting: Radiology

## 2024-07-30 ENCOUNTER — Ambulatory Visit

## 2024-07-31 ENCOUNTER — Encounter

## 2024-08-01 ENCOUNTER — Ambulatory Visit

## 2024-08-01 DIAGNOSIS — R55 Syncope and collapse: Secondary | ICD-10-CM | POA: Diagnosis not present

## 2024-08-01 LAB — CUP PACEART REMOTE DEVICE CHECK
Date Time Interrogation Session: 20251124230838
Implantable Pulse Generator Implant Date: 20210823

## 2024-08-02 ENCOUNTER — Ambulatory Visit: Payer: Self-pay | Admitting: Cardiovascular Disease

## 2024-08-02 NOTE — Progress Notes (Signed)
 Remote Loop Recorder Transmission

## 2024-08-10 ENCOUNTER — Telehealth: Payer: Self-pay

## 2024-08-10 NOTE — Telephone Encounter (Signed)
 Thank you, I agree with the advice you provided the patient.

## 2024-08-10 NOTE — Telephone Encounter (Signed)
 Alert remote transmission: Symptom Event occurred 12/3 @ 13:32, EGM c/w SR with PVC, tightness in chest   Presenting with ectopy.    Patient also appears past due for annual follow up with Dr. Francyne.    Unable to reach patient on phone to discuss. LMTRC.  Forwarding to Dr. Francyne.

## 2024-08-10 NOTE — Telephone Encounter (Signed)
 I spoke with the patient.  Advised her that on presenting she was having PVC's, which she does not recall ever being told she has had these. Reviewed her transmission around the time of her symptom activation and advised her that she was in a NSR at the time.   Inquired as to what symptoms she has been having.  The patient reports she was having a heaviness in her chest at the time of her symptom activation, however, she has not felt well for ~ a week. No new fevers, she did have fevers about 2 weeks ago with a UTI which was treated with antibiotics.   The patient confirms sinus drainage with known seasonal allergies, but she is also stating she has been coughing when she lays flat.   The patient does not have a history of CAD. She has a known ascending aortic aneurysm with up to date imaging- followed by Dr. Teresa (PCP).  I have advised the patient to reach out to Dr. Joleen office to ensure no underlying respiratory infection.  The patient is agreeable. She is aware that if Dr. Teresa feels she warrants a cardiology follow up visit to address symptoms, to please call back and we will schedule her to be seen here as well.  The patient voices understanding of the above and is agreeable.

## 2024-08-15 ENCOUNTER — Other Ambulatory Visit: Payer: Self-pay | Admitting: Surgery

## 2024-08-15 DIAGNOSIS — I7121 Aneurysm of the ascending aorta, without rupture: Secondary | ICD-10-CM

## 2024-08-31 ENCOUNTER — Encounter

## 2024-09-01 ENCOUNTER — Ambulatory Visit: Attending: Cardiovascular Disease

## 2024-09-01 DIAGNOSIS — R55 Syncope and collapse: Secondary | ICD-10-CM | POA: Diagnosis not present

## 2024-09-01 LAB — CUP PACEART REMOTE DEVICE CHECK
Date Time Interrogation Session: 20251225230207
Implantable Pulse Generator Implant Date: 20210823

## 2024-09-02 ENCOUNTER — Ambulatory Visit: Payer: Self-pay | Admitting: Cardiovascular Disease

## 2024-09-06 NOTE — Progress Notes (Signed)
 Remote Loop Recorder Transmission

## 2024-10-02 ENCOUNTER — Ambulatory Visit: Attending: Cardiovascular Disease

## 2024-10-02 ENCOUNTER — Encounter

## 2024-10-02 ENCOUNTER — Ambulatory Visit

## 2024-10-02 DIAGNOSIS — R55 Syncope and collapse: Secondary | ICD-10-CM

## 2024-10-02 LAB — CUP PACEART REMOTE DEVICE CHECK
Date Time Interrogation Session: 20260125230258
Implantable Pulse Generator Implant Date: 20210823

## 2024-10-03 ENCOUNTER — Ambulatory Visit: Payer: Self-pay | Admitting: Cardiovascular Disease

## 2024-10-10 NOTE — Progress Notes (Signed)
 Remote Loop Recorder Transmission

## 2024-11-02 ENCOUNTER — Encounter

## 2024-11-02 ENCOUNTER — Ambulatory Visit

## 2024-12-04 ENCOUNTER — Encounter

## 2025-01-04 ENCOUNTER — Encounter

## 2025-02-05 ENCOUNTER — Encounter

## 2025-05-09 ENCOUNTER — Ambulatory Visit: Admitting: Neuroradiology
